# Patient Record
Sex: Female | Born: 1970 | Race: White | Hispanic: No | Marital: Married | Smoking: Never smoker
Health system: Southern US, Community
[De-identification: ages and names within clinical notes are randomized; demographics above are authoritative.]

## PROBLEM LIST (undated history)

## (undated) DIAGNOSIS — J302 Other seasonal allergic rhinitis: Secondary | ICD-10-CM

## (undated) DIAGNOSIS — R011 Cardiac murmur, unspecified: Secondary | ICD-10-CM

## (undated) DIAGNOSIS — J45909 Unspecified asthma, uncomplicated: Secondary | ICD-10-CM

## (undated) DIAGNOSIS — G40909 Epilepsy, unspecified, not intractable, without status epilepticus: Secondary | ICD-10-CM

## (undated) DIAGNOSIS — E039 Hypothyroidism, unspecified: Secondary | ICD-10-CM

## (undated) DIAGNOSIS — Z87440 Personal history of urinary (tract) infections: Secondary | ICD-10-CM

## (undated) DIAGNOSIS — I456 Pre-excitation syndrome: Secondary | ICD-10-CM

## (undated) DIAGNOSIS — R32 Unspecified urinary incontinence: Secondary | ICD-10-CM

## (undated) HISTORY — PX: NO PAST SURGERIES: SHX2092

## (undated) HISTORY — DX: Other seasonal allergic rhinitis: J30.2

## (undated) HISTORY — DX: Cardiac murmur, unspecified: R01.1

## (undated) HISTORY — DX: Hypothyroidism, unspecified: E03.9

## (undated) HISTORY — DX: Unspecified asthma, uncomplicated: J45.909

## (undated) HISTORY — DX: Pre-excitation syndrome: I45.6

## (undated) HISTORY — DX: Personal history of urinary (tract) infections: Z87.440

## (undated) HISTORY — DX: Unspecified urinary incontinence: R32

## (undated) HISTORY — DX: Epilepsy, unspecified, not intractable, without status epilepticus: G40.909

---

## 2001-10-26 ENCOUNTER — Encounter: Payer: Self-pay | Admitting: Obstetrics and Gynecology

## 2001-10-26 ENCOUNTER — Ambulatory Visit (HOSPITAL_COMMUNITY): Admission: RE | Admit: 2001-10-26 | Discharge: 2001-10-26 | Payer: Self-pay | Admitting: Obstetrics and Gynecology

## 2013-04-28 ENCOUNTER — Ambulatory Visit: Payer: Self-pay | Admitting: Endocrinology

## 2013-05-19 ENCOUNTER — Ambulatory Visit (INDEPENDENT_AMBULATORY_CARE_PROVIDER_SITE_OTHER): Payer: No Typology Code available for payment source | Admitting: Endocrinology

## 2013-05-19 ENCOUNTER — Encounter: Payer: Self-pay | Admitting: Endocrinology

## 2013-05-19 VITALS — BP 118/70 | HR 61 | Temp 98.3°F | Resp 10 | Ht 68.5 in | Wt 191.9 lb

## 2013-05-19 DIAGNOSIS — E039 Hypothyroidism, unspecified: Secondary | ICD-10-CM

## 2013-05-19 DIAGNOSIS — J45909 Unspecified asthma, uncomplicated: Secondary | ICD-10-CM

## 2013-05-19 LAB — TSH: TSH: 5.293 u[IU]/mL — ABNORMAL HIGH (ref 0.350–4.500)

## 2013-05-19 LAB — T4, FREE: Free T4: 1.36 ng/dL (ref 0.80–1.80)

## 2013-05-19 MED ORDER — LEVOTHYROXINE SODIUM 150 MCG PO TABS: 150.0000 ug | ORAL_TABLET | Freq: Every day | ORAL | Status: DC

## 2013-05-19 NOTE — Patient Instructions (Signed)
blood tests are being requested for you today.  We'll contact you with results.  

## 2013-05-19 NOTE — Progress Notes (Signed)
  Subjective:    Patient ID: Rebecca Silva, female    DOB: 1971/07/06, 42 y.o.   MRN: [id number]  HPI Pt says she was dx'ed with primary hypothyroidism in 2003.  She has been on thyroid hormone supplements since then.  She has been on the current dosage of 137 mcg/day, x approx 2 months.  This is the highest dosage she has ever taken.  She has slight hair loss on the head, and assoc fatigue.  Past Medical History  Diagnosis Date  . Thyroid disease     hypothyroidism  . Asthma   . Allergy     seasonal    History reviewed. No pertinent past surgical history.  History   Social History  . Marital Status: Married    Spouse Name: N/A    Number of Children: N/A  . Years of Education: N/A   Occupational History  . Not on file.   Social History Main Topics  . Smoking status: Never Smoker   . Smokeless tobacco: Never Used  . Alcohol Use: Yes  . Drug Use: No  . Sexual Activity: Yes    Partners: Female   Other Topics Concern  . 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   No current facility-administered medications on file prior to visit.    Not on File  Family History  Problem Relation Age of Onset  . Heart disease Mother 54    open heart surgery  . Heart disease Maternal Grandmother   no thyroid problems  BP 118/70  Pulse 61  Temp(Src) 98.3 F (36.8 C) (Oral)  Resp 10  Ht 5' 8.5" (1.74 m)  Wt 191 lb 14.4 oz (87.045 kg)  BMI 28.75 kg/m2  SpO2 95%  Review of Systems denies depression, memory loss, constipation, numbness, blurry vision, myalgias, and syncope.  She has dry skin, weight gain, rhinorrhea, easy bruising, and leg cramps.  She attributes intermittent sob to asthma.     Objective:   Physical Exam VS: see vs page GEN: no distress HEAD:  head: no deformity eyes: no periorbital swelling, no proptosis external nose and ears are normal mouth: no lesion seen NECK: supple, thyroid is not enlarged CHEST WALL: no deformity LUNGS:  Clear to auscultation CV: reg rate and rhythm, no murmur ABD: abdomen is soft, nontender.  no hepatosplenomegaly.  not distended.  no hernia MUSCULOSKELETAL: muscle bulk and strength are grossly normal.  no obvious joint swelling.  gait is normal and steady EXTEMITIES: no deformity.  no ulcer on the feet.  feet are of normal color and temp.  no edema PULSES: dorsalis pedis intact bilat.  no carotid bruit NEURO:  cn 2-12 grossly intact.   readily moves all 4's.  sensation is intact to touch on the feet.  No tremor SKIN:  Normal texture and temperature.  No rash or suspicious lesion is visible.  Not diaphoretic NODES:  None palpable at the neck PSYCH: alert, oriented x3.  Does not appear anxious nor depressed.   Lab Results  Component Value Date   TSH 5.293* 05/19/2013      Assessment & Plan:  Chronic hypothyroidism: she needs increased rx Sob, due to asthma Fatigue, not thyroid-related

## 2013-05-20 DIAGNOSIS — J4540 Moderate persistent asthma, uncomplicated: Secondary | ICD-10-CM | POA: Insufficient documentation

## 2013-05-22 ENCOUNTER — Telehealth: Payer: Self-pay | Admitting: *Deleted

## 2013-05-22 NOTE — Telephone Encounter (Signed)
Please increase thyroid pill. i have sent a prescription to your pharmacy. Please recheck the blood test in 1 month

## 2013-05-23 ENCOUNTER — Telehealth: Payer: Self-pay | Admitting: Endocrinology

## 2013-05-23 NOTE — Telephone Encounter (Signed)
CB# [phone] - please call w/ lab results-drawn at [facility]

## 2013-05-23 NOTE — Telephone Encounter (Signed)
Left message with lab results

## 2013-05-25 ENCOUNTER — Other Ambulatory Visit: Payer: Self-pay

## 2013-06-27 ENCOUNTER — Other Ambulatory Visit: Payer: Self-pay | Admitting: *Deleted

## 2013-06-27 ENCOUNTER — Other Ambulatory Visit: Payer: No Typology Code available for payment source

## 2013-06-27 DIAGNOSIS — E039 Hypothyroidism, unspecified: Secondary | ICD-10-CM

## 2013-06-30 ENCOUNTER — Other Ambulatory Visit (INDEPENDENT_AMBULATORY_CARE_PROVIDER_SITE_OTHER): Payer: No Typology Code available for payment source

## 2013-06-30 DIAGNOSIS — E039 Hypothyroidism, unspecified: Secondary | ICD-10-CM

## 2014-06-06 ENCOUNTER — Encounter (HOSPITAL_COMMUNITY): Payer: Self-pay

## 2014-06-06 ENCOUNTER — Emergency Department (HOSPITAL_COMMUNITY)
Admission: EM | Admit: 2014-06-06 | Discharge: 2014-06-06 | Disposition: A | Discharge status: Home / Self Care | Payer: 59 | Source: Home / Self Care | Attending: Emergency Medicine | Admitting: Emergency Medicine

## 2014-06-06 ENCOUNTER — Inpatient Hospital Stay (HOSPITAL_COMMUNITY)
Admission: EM | Admit: 2014-06-06 | Discharge: 2014-06-08 | DRG: 203 | Disposition: A | Discharge status: Home / Self Care | Payer: 59 | Attending: Internal Medicine | Admitting: Internal Medicine

## 2014-06-06 ENCOUNTER — Emergency Department (HOSPITAL_COMMUNITY): Payer: 59

## 2014-06-06 ENCOUNTER — Encounter (HOSPITAL_COMMUNITY): Payer: Self-pay | Admitting: *Deleted

## 2014-06-06 DIAGNOSIS — J069 Acute upper respiratory infection, unspecified: Secondary | ICD-10-CM | POA: Diagnosis present

## 2014-06-06 DIAGNOSIS — F439 Reaction to severe stress, unspecified: Secondary | ICD-10-CM | POA: Diagnosis present

## 2014-06-06 DIAGNOSIS — D72829 Elevated white blood cell count, unspecified: Secondary | ICD-10-CM | POA: Diagnosis present

## 2014-06-06 DIAGNOSIS — J45901 Unspecified asthma with (acute) exacerbation: Secondary | ICD-10-CM | POA: Insufficient documentation

## 2014-06-06 DIAGNOSIS — R000 Tachycardia, unspecified: Secondary | ICD-10-CM | POA: Diagnosis present

## 2014-06-06 DIAGNOSIS — R0602 Shortness of breath: Secondary | ICD-10-CM

## 2014-06-06 DIAGNOSIS — E039 Hypothyroidism, unspecified: Secondary | ICD-10-CM | POA: Insufficient documentation

## 2014-06-06 DIAGNOSIS — Z79899 Other long term (current) drug therapy: Secondary | ICD-10-CM

## 2014-06-06 DIAGNOSIS — B9789 Other viral agents as the cause of diseases classified elsewhere: Secondary | ICD-10-CM

## 2014-06-06 LAB — BASIC METABOLIC PANEL
Anion gap: 15 (ref 5–15)
BUN: 7 mg/dL (ref 6–23)
CALCIUM: 8.8 mg/dL (ref 8.4–10.5)
CO2: 22 mEq/L (ref 19–32)
Chloride: 101 mEq/L (ref 96–112)
Creatinine, Ser: 0.73 mg/dL (ref 0.50–1.10)
GFR calc Af Amer: >90 mL/min (ref >90)
GFR calc non Af Amer: >90 mL/min (ref >90)
GLUCOSE: 159 mg/dL — AB (ref 70–99)
Potassium: 4.6 mEq/L (ref 3.7–5.3)
Sodium: 138 mEq/L (ref 137–147)

## 2014-06-06 LAB — CBC WITH DIFFERENTIAL/PLATELET
Basophils Absolute: 0 10*3/uL (ref 0.0–0.1)
Basophils Relative: 0 % (ref 0–1)
EOS PCT: 0 % (ref 0–5)
Eosinophils Absolute: 0 10*3/uL (ref 0.0–0.7)
HCT: 35 % — ABNORMAL LOW (ref 36.0–46.0)
HEMOGLOBIN: 11.1 g/dL — AB (ref 12.0–15.0)
LYMPHS PCT: 3 % — AB (ref 12–46)
Lymphs Abs: 0.5 10*3/uL — ABNORMAL LOW (ref 0.7–4.0)
MCH: 24.9 pg — ABNORMAL LOW (ref 26.0–34.0)
MCHC: 31.7 g/dL (ref 30.0–36.0)
MCV: 78.7 fL (ref 78.0–100.0)
MONO ABS: 0.2 10*3/uL (ref 0.1–1.0)
Monocytes Relative: 1 % — ABNORMAL LOW (ref 3–12)
Neutro Abs: 18.8 10*3/uL — ABNORMAL HIGH (ref 1.7–7.7)
Neutrophils Relative %: 96 % — ABNORMAL HIGH (ref 43–77)
Platelets: 261 10*3/uL (ref 150–400)
RBC: 4.45 MIL/uL (ref 3.87–5.11)
RDW: 15.3 % (ref 11.5–15.5)
WBC: 19.6 10*3/uL — ABNORMAL HIGH (ref 4.0–10.5)

## 2014-06-06 MED ORDER — IPRATROPIUM BROMIDE 0.02 % IN SOLN
0.5000 mg | RESPIRATORY_TRACT | Status: AC
  Administered 2014-06-06: 0.5 mg via RESPIRATORY_TRACT
  Filled 2014-06-06: qty 2.5

## 2014-06-06 MED ORDER — MAGNESIUM SULFATE 2 GM/50ML IV SOLN
2.0000 g | Freq: Once | INTRAVENOUS | Status: AC
  Administered 2014-06-06: 2 g via INTRAVENOUS
  Filled 2014-06-06: qty 50

## 2014-06-06 MED ORDER — SODIUM CHLORIDE 0.45 % IV SOLN
INTRAVENOUS | Status: DC
  Administered 2014-06-06 – 2014-06-08 (×3): via INTRAVENOUS

## 2014-06-06 MED ORDER — ACETAMINOPHEN 325 MG PO TABS
650.0000 mg | ORAL_TABLET | Freq: Four times a day (QID) | ORAL | Status: DC | PRN
  Administered 2014-06-07 (×2): 650 mg via ORAL
  Filled 2014-06-06 (×2): qty 2

## 2014-06-06 MED ORDER — IPRATROPIUM BROMIDE 0.03 % NA SOLN
2.0000 | Freq: Three times a day (TID) | NASAL | Status: DC | PRN
  Filled 2014-06-06: qty 15

## 2014-06-06 MED ORDER — ONDANSETRON HCL 4 MG PO TABS: 4.0000 mg | ORAL_TABLET | Freq: Four times a day (QID) | ORAL | Status: DC | PRN

## 2014-06-06 MED ORDER — IBUPROFEN 600 MG PO TABS: 600.0000 mg | ORAL_TABLET | Freq: Four times a day (QID) | ORAL | Status: DC | PRN

## 2014-06-06 MED ORDER — PNEUMOCOCCAL VAC POLYVALENT 25 MCG/0.5ML IJ INJ
0.5000 mL | INJECTION | INTRAMUSCULAR | Status: AC
  Administered 2014-06-07: 0.5 mL via INTRAMUSCULAR
  Filled 2014-06-06: qty 0.5

## 2014-06-06 MED ORDER — LEVOTHYROXINE SODIUM 75 MCG PO TABS
150.0000 ug | ORAL_TABLET | Freq: Every day | ORAL | Status: DC
  Administered 2014-06-07 – 2014-06-08 (×2): 150 ug via ORAL
  Filled 2014-06-06 (×2): qty 2

## 2014-06-06 MED ORDER — HEPARIN SODIUM (PORCINE) 5000 UNIT/ML IJ SOLN
5000.0000 [IU] | Freq: Three times a day (TID) | INTRAMUSCULAR | Status: DC
  Administered 2014-06-06 – 2014-06-08 (×5): 5000 [IU] via SUBCUTANEOUS
  Filled 2014-06-06 (×5): qty 1

## 2014-06-06 MED ORDER — LEVALBUTEROL HCL 1.25 MG/0.5ML IN NEBU
1.2500 mg | INHALATION_SOLUTION | RESPIRATORY_TRACT | Status: DC
  Administered 2014-06-06 – 2014-06-07 (×4): 1.25 mg via RESPIRATORY_TRACT
  Filled 2014-06-06 (×4): qty 0.5

## 2014-06-06 MED ORDER — METHYLPREDNISOLONE SODIUM SUCC 125 MG IJ SOLR
125.0000 mg | Freq: Once | INTRAMUSCULAR | Status: AC
  Administered 2014-06-06: 125 mg via INTRAVENOUS
  Filled 2014-06-06: qty 2

## 2014-06-06 MED ORDER — SALINE SPRAY 0.65 % NA SOLN: 1.0000 | NASAL | Status: DC | PRN

## 2014-06-06 MED ORDER — PREDNISONE 20 MG PO TABS: 40.0000 mg | ORAL_TABLET | Freq: Every day | ORAL | Status: DC

## 2014-06-06 MED ORDER — DM-GUAIFENESIN ER 30-600 MG PO TB12
1.0000 | ORAL_TABLET | Freq: Two times a day (BID) | ORAL | Status: DC
  Administered 2014-06-06 – 2014-06-08 (×4): 1 via ORAL
  Filled 2014-06-06 (×4): qty 1

## 2014-06-06 MED ORDER — PREDNISONE 20 MG PO TABS
40.0000 mg | ORAL_TABLET | Freq: Once | ORAL | Status: AC
  Administered 2014-06-06: 40 mg via ORAL
  Filled 2014-06-06: qty 2

## 2014-06-06 MED ORDER — ONDANSETRON HCL 4 MG/2ML IJ SOLN: 4.0000 mg | Freq: Four times a day (QID) | INTRAMUSCULAR | Status: DC | PRN

## 2014-06-06 MED ORDER — ALBUTEROL (5 MG/ML) CONTINUOUS INHALATION SOLN
10.0000 mg/h | INHALATION_SOLUTION | RESPIRATORY_TRACT | Status: AC
  Administered 2014-06-06: 10 mg/h via RESPIRATORY_TRACT
  Filled 2014-06-06: qty 20

## 2014-06-06 MED ORDER — INFLUENZA VAC SPLIT QUAD 0.5 ML IM SUSY
0.5000 mL | PREFILLED_SYRINGE | INTRAMUSCULAR | Status: AC
  Administered 2014-06-07: 0.5 mL via INTRAMUSCULAR
  Filled 2014-06-06: qty 0.5

## 2014-06-06 MED ORDER — ALBUTEROL (5 MG/ML) CONTINUOUS INHALATION SOLN
10.0000 mg/h | INHALATION_SOLUTION | Freq: Once | RESPIRATORY_TRACT | Status: AC
  Administered 2014-06-06: 10 mg/h via RESPIRATORY_TRACT

## 2014-06-06 MED ORDER — ACETAMINOPHEN 650 MG RE SUPP: 650.0000 mg | Freq: Four times a day (QID) | RECTAL | Status: DC | PRN

## 2014-06-06 MED ORDER — METHYLPREDNISOLONE SODIUM SUCC 125 MG IJ SOLR
60.0000 mg | Freq: Four times a day (QID) | INTRAMUSCULAR | Status: DC
  Administered 2014-06-06 – 2014-06-08 (×6): 60 mg via INTRAVENOUS
  Filled 2014-06-06 (×6): qty 2

## 2014-06-06 MED ORDER — SODIUM CHLORIDE 0.9 % IN NEBU
INHALATION_SOLUTION | RESPIRATORY_TRACT | Status: AC
  Administered 2014-06-06: 3 mL
  Filled 2014-06-06: qty 3

## 2014-06-06 NOTE — ED Notes (Signed)
Pt states she feels a little it better.  Up ambulatory to bathroom.

## 2014-06-06 NOTE — ED Provider Notes (Signed)
CSN: [id number]     Arrival date & time 06/06/14  1049 History  This chart was scribed for Rebecca Acosta, MD by Rayfield Citizen, ED Scribe. This patient was seen in room APA06/APA06 and the patient's care was started at 11:29 AM.     Chief Complaint  Patient presents with  . Shortness of Breath   The history is provided by the patient. No language interpreter was used.     HPI Comments: Rebecca Silva is a 43 y.o. female with past medical history of asthma who presents to the Emergency Department 1 day of complaining of SOB and chest tightness. Patient does not have a daily treatment but utilizes an inhaler as needed; she has recently been using her inhaler 1-2x a day. She was given a sample of Advair and a zpack at the PCP this morning; she had an albuterol breathing treatment around 0930 and used her ventolin inhaler since leaving the PCP, but her symptoms have not improved. She denies fever.   Past Medical History  Diagnosis Date  . Thyroid disease     hypothyroidism  . Asthma   . Allergy     seasonal   No past surgical history on file. Family History  Problem Relation Age of Onset  . Heart disease Mother 2    open heart surgery  . Heart disease Maternal Grandmother    History  Substance Use Topics  . Smoking status: Never Smoker   . Smokeless tobacco: Never Used  . Alcohol Use: No   OB History    No data available     Review of Systems  Constitutional: Negative for fever.  Respiratory: Positive for chest tightness and shortness of breath.   All other systems reviewed and are negative.  Allergies  Review of patient's allergies indicates no known allergies.  Home Medications   Prior to Admission medications   Medication Sig Start Date End Date Taking? Authorizing Provider  albuterol (VENTOLIN HFA) 108 (90 BASE) MCG/ACT inhaler Inhale 2 puffs into the lungs every 6 (six) hours as needed for wheezing.   Yes Historical Provider, MD  levothyroxine (SYNTHROID,  LEVOTHROID) 150 MCG tablet Take 1 tablet (150 mcg total) by mouth daily before breakfast. 05/19/13  Yes Renato Shin, MD  Naproxen Sod-Diphenhydramine (ALEVE PM PO) Take 1 tablet by mouth at bedtime as needed and may repeat dose one time if needed (PAIN/SLEEP).   Yes Historical Provider, MD  predniSONE (DELTASONE) 20 MG tablet Take 2 tablets (40 mg total) by mouth daily. 06/06/14   Rebecca Acosta, MD   BP 132/68 mmHg  Pulse 118  Temp(Src) 98.4 F (36.9 C) (Oral)  Resp 22  Ht 5\' 8"  (1.727 m)  Wt 196 lb (88.905 kg)  BMI 29.81 kg/m2  SpO2 95%  LMP 06/04/2014 Physical Exam  Constitutional: She is oriented to person, place, and time. She appears well-developed and well-nourished.  HENT:  Head: Normocephalic and atraumatic.  Neck: No tracheal deviation present.  Cardiovascular: Normal rate.   Pulmonary/Chest: Effort normal. She has wheezes.  Mild diffuse expiratory wheezing  Neurological: She is alert and oriented to person, place, and time.  Skin: Skin is warm and dry.  Psychiatric: She has a normal mood and affect. Her behavior is normal.  Nursing note and vitals reviewed.   ED Course  Procedures   DIAGNOSTIC STUDIES: Oxygen Saturation is 95% on RA, normal by my interpretation.    COORDINATION OF CARE: 11:32 AM Discussed treatment plan with pt at bedside  and pt agreed to plan.   Labs Review Labs Reviewed - No data to display  Imaging Review Dg Chest 2 View  06/06/2014   CLINICAL DATA:  Shortness of breath, productive cough, wheezing  EXAM: CHEST  2 VIEW  COMPARISON:  None.  FINDINGS: Lungs are clear.  No pleural effusion or pneumothorax.  The heart is normal in size.  Visualized osseous structures are within normal limits.  IMPRESSION: No evidence of acute cardiopulmonary disease.   Electronically Signed   By: Julian Hy M.D.   On: 06/06/2014 12:56      MDM   Final diagnoses:  SOB (shortness of breath)    Continuous nebulizer therapy given, prednisone given,  the patient's vital signs have remained unchanged, she is oxygenating between 95 and 100%, she is not tachypneic or using accessory muscles, she is no longer wheezing after the treatment. She reports that she has improved, she already has medication at home including albuterol, Advair and prednisone which I have prescribed. She will take Zithromax as prescribed by her doctor and return for recheck. It is extremely unlikely that this is related to something else such as pneumonia or pneumothorax as her x-ray was normal, this does not sound consistent with cardiac disease. There is no peripheral edema, JVD or history of exertional symptoms. The patient is stable for discharge on the following medications.   Meds given in ED:  Medications  albuterol (PROVENTIL,VENTOLIN) solution continuous neb (10 mg/hr Nebulization New Bag/Given 06/06/14 1126)  ipratropium (ATROVENT) nebulizer solution 0.5 mg (0.5 mg Nebulization Given 06/06/14 1126)  predniSONE (DELTASONE) tablet 40 mg (40 mg Oral Given 06/06/14 1330)    New Prescriptions   PREDNISONE (DELTASONE) 20 MG TABLET    Take 2 tablets (40 mg total) by mouth daily.    I personally performed the services described in this documentation, which was scribed in my presence. The recorded information has been reviewed and is accurate.        Rebecca Acosta, MD 06/06/14 1340

## 2014-06-06 NOTE — ED Notes (Addendum)
Pt was seen earlier for same, co SOB, pt states when she got home from the ER the SOB returned just as before. Was treated in ER with breathing TX and prednisone. Pt's HR 120's, pt is diaphoretic at triage, EKG performed in triage.

## 2014-06-06 NOTE — H&P (Signed)
[facility] History and Physical  Rebecca Silva RSW:[id number] DOB: 03/22/1971 DOA: 06/06/2014  Referring physician: Dr Betsey Holiday - [facility] PCP: Renee Rival, NP   Chief Complaint: asthma  HPI: Rebecca Silva is a 43 y.o. female  Started getting SOb and wheezing last night. Associated w/ congestion and cough. Husband also w/ recent URI. Mucinex w/o relief. Using albuterol Q3hrs w/ some improvement. Getting worse.  Pt presenting w/ typical asthma flare. Came to ED earlier today and received nebs and prednisone. Initially felt better but then worsened. Came back to ED for further evaluation.  No feeling like her heart rate is very high because of the albuterol.    Review of Systems:  Constitutional:  No weight loss, night sweats, Fevers, chills, fatigue.  HEENT:  Runny nose, cough, upper airway congestion, post nasal drip,  Cardio-vascular:  No chest pain, Orthopnea, PND, swelling in lower extremities, anasarca, dizziness, palpitations  GI:  No heartburn, indigestion, abdominal pain, nausea, vomiting, diarrhea, change in bowel habits, loss of appetite  Resp:  Per HPI Skin:  no rash or lesions.  GU:  no dysuria, change in color of urine, no urgency or frequency. No flank pain.  Musculoskeletal:  No joint pain or swelling. No decreased range of motion. No back pain.  Psych:  No change in mood or affect. No depression or anxiety. No memory loss.   Past Medical History  Diagnosis Date  . Thyroid disease     hypothyroidism  . Asthma   . Allergy     seasonal   History reviewed. No pertinent past surgical history. Social History:  reports that she has never smoked. She has never used smokeless tobacco. She reports that she does not drink alcohol or use illicit drugs.  No Known Allergies  Family History  Problem Relation Age of Onset  . Heart disease Mother 66    open heart surgery  . Heart disease Maternal Grandmother      Prior to Admission medications     Medication Sig Start Date End Date Taking? Authorizing Provider  albuterol (VENTOLIN HFA) 108 (90 BASE) MCG/ACT inhaler Inhale 2 puffs into the lungs every 6 (six) hours as needed for wheezing.   Yes Historical Provider, MD  levothyroxine (SYNTHROID, LEVOTHROID) 150 MCG tablet Take 1 tablet (150 mcg total) by mouth daily before breakfast. 05/19/13  Yes Renato Shin, MD  Naproxen Sod-Diphenhydramine (ALEVE PM PO) Take 1 tablet by mouth at bedtime as needed and may repeat dose one time if needed (PAIN/SLEEP).   Yes Historical Provider, MD  predniSONE (DELTASONE) 20 MG tablet Take 2 tablets (40 mg total) by mouth daily. 06/06/14   Johnna Acosta, MD   Physical Exam: Filed Vitals:   06/06/14 1835 06/06/14 1840 06/06/14 1854 06/06/14 2032  BP: 126/49   131/53  Pulse: 128 124 125 112  Temp:    99 F (37.2 C)  TempSrc:    Oral  Resp: 26 27 22 22   Height:      Weight:      SpO2: 94% 94% 93% 94%    Wt Readings from Last 3 Encounters:  06/06/14 88.905 kg (196 lb)  06/06/14 88.905 kg (196 lb)  05/19/13 87.045 kg (191 lb 14.4 oz)    General:  Appears calm and comfortable Eyes:  PERRL, normal lids, irises & conjunctiva ENT: dry mucus membranes, grossly normal hearing,  Neck:  no LAD, masses or thyromegaly Cardiovascular: Tachy, no m/r/g. No LE edema. Respiratory: diminished breath sounds throughout, mild  increased WOB., diffuse wheezing throughout.  Abdomen:  soft, ntnd Skin:  no rash or induration seen on limited exam Musculoskeletal:  grossly normal tone BUE/BLE Psychiatric:  grossly normal mood and affect, speech fluent and appropriate Neurologic:  grossly non-focal.          Labs on Admission:  Basic Metabolic Panel:  Recent Labs Lab 06/06/14 1702  NA 138  K 4.6  CL 101  CO2 22  GLUCOSE 159*  BUN 7  CREATININE 0.73  CALCIUM 8.8   Liver Function Tests: No results for input(s): AST, ALT, ALKPHOS, BILITOT, PROT, ALBUMIN in the last 168 hours. No results for input(s):  LIPASE, AMYLASE in the last 168 hours. No results for input(s): AMMONIA in the last 168 hours. CBC:  Recent Labs Lab 06/06/14 1702  WBC 19.6*  NEUTROABS 18.8*  HGB 11.1*  HCT 35.0*  MCV 78.7  PLT 261   Cardiac Enzymes: No results for input(s): CKTOTAL, CKMB, CKMBINDEX, TROPONINI in the last 168 hours.  BNP (last 3 results) No results for input(s): PROBNP in the last 8760 hours. CBG: No results for input(s): GLUCAP in the last 168 hours.  Radiological Exams on Admission: Dg Chest 2 View  06/06/2014   CLINICAL DATA:  Shortness of breath, productive cough, wheezing  EXAM: CHEST  2 VIEW  COMPARISON:  None.  FINDINGS: Lungs are clear.  No pleural effusion or pneumothorax.  The heart is normal in size.  Visualized osseous structures are within normal limits.  IMPRESSION: No evidence of acute cardiopulmonary disease.   Electronically Signed   By: Julian Hy M.D.   On: 06/06/2014 12:56    EKG: Independently reviewed. Sinus tach. No sign of ischemia  Assessment/Plan Principal Problem:   Asthma exacerbation Active Problems:   Hypothyroidism   Leukocytosis   Viral URI with cough   Tachycardia  Asthma Exacerbation: likely started due to URI w/ PND and change in weather. CAT, Mag, and Solumedrol 125 in ED w/ improvement.  No O2 requirement but satting in the low to mid 90s. Tachy likely from the Albuterol - Admit - O2 PRN - Xopenex Q4 - Solumedrol 60mg  Q6 - IVF 1/2NS 164ml/hr overnight. Consider DC in am  URI: likely viral etiology.  - nasal saline - Mucinex - nasal atrovent - motrin  Leukocytosis: Stress reaction compounded by steroids vs infection. WBC 19.6 w/ L shift. CXR w/o sign of pulmonary process. AFVSS. Currently battling a viral URI.  - UA - CBC in am  Hypothyroidism:  - continue levothyroxine   Code Status: FULL DVT Prophylaxis: Hep [place] TID Family Communication: None Disposition Plan: pending improvement    MERRELL, [place] [facility] [email or web address] Password [email or web address]

## 2014-06-06 NOTE — ED Provider Notes (Signed)
CSN: [id number]     Arrival date & time 06/06/14  1628 History  This chart was scribe for Orpah Greek, * by Judithann Sauger, ED Scribe. The patient was seen in room APA09/APA09 and the patient's care was started at 4:45 PM.    Chief Complaint  Patient presents with  . Shortness of Breath   The history is provided by the patient. No language interpreter was used.   HPI Comments: Rebecca Silva is a 43 y.o. female with a hx of asthma who presents to the Emergency Department complaining of SOB. Pt was seen earlier in the day for similar symptoms. She states that her symptoms started yesterday so she came in to the ER this morning and received breathing treatments and steroids but her symptoms gradually worsened on her way home. Her oxygen saturation was 88% when she came in earlier.   Past Medical History  Diagnosis Date  . Thyroid disease     hypothyroidism  . Asthma   . Allergy     seasonal   History reviewed. No pertinent past surgical history. Family History  Problem Relation Age of Onset  . Heart disease Mother 69    open heart surgery  . Heart disease Maternal Grandmother    History  Substance Use Topics  . Smoking status: Never Smoker   . Smokeless tobacco: Never Used  . Alcohol Use: No   OB History    No data available     Review of Systems  Constitutional: Negative for fever and chills.  Respiratory: Positive for shortness of breath.   Cardiovascular: Negative for chest pain.  Gastrointestinal: Negative for nausea and vomiting.  Musculoskeletal: Negative for back pain.  All other systems reviewed and are negative.     Allergies  Review of patient's allergies indicates no known allergies.  Home Medications   Prior to Admission medications   Medication Sig Start Date End Date Taking? Authorizing Provider  albuterol (VENTOLIN HFA) 108 (90 BASE) MCG/ACT inhaler Inhale 2 puffs into the lungs every 6 (six) hours as needed for wheezing.   Yes  Historical Provider, MD  levothyroxine (SYNTHROID, LEVOTHROID) 150 MCG tablet Take 1 tablet (150 mcg total) by mouth daily before breakfast. 05/19/13  Yes Renato Shin, MD  Naproxen Sod-Diphenhydramine (ALEVE PM PO) Take 1 tablet by mouth at bedtime as needed and may repeat dose one time if needed (PAIN/SLEEP).   Yes Historical Provider, MD  predniSONE (DELTASONE) 20 MG tablet Take 2 tablets (40 mg total) by mouth daily. 06/06/14   Johnna Acosta, MD   BP 126/49 mmHg  Pulse 125  Temp(Src) 98.2 F (36.8 C) (Oral)  Resp 22  Ht 5\' 8"  (1.727 m)  Wt 196 lb (88.905 kg)  BMI 29.81 kg/m2  SpO2 93%  LMP 06/04/2014 Physical Exam  Constitutional: She is oriented to person, place, and time. She appears well-developed and well-nourished.  HENT:  Head: Normocephalic and atraumatic.  Cardiovascular: Normal rate.   Tachycardiac   Pulmonary/Chest: Effort normal. She has wheezes. She exhibits no tenderness.  Breath sounds reduced and bilateral wheezes  Tachypnea   Abdominal: She exhibits no distension. There is no tenderness.  Neurological: She is alert and oriented to person, place, and time.  Skin: Skin is warm and dry.  Psychiatric: She has a normal mood and affect.  Nursing note and vitals reviewed.   ED Course  Procedures (including critical care time) DIAGNOSTIC STUDIES: Oxygen Saturation is 94% on RA, adequate by my interpretation.  COORDINATION OF CARE: 4:49 PM- Pt advised of plan for treatment and pt agrees.   Labs Review Labs Reviewed  CBC WITH DIFFERENTIAL - Abnormal; Notable for the following:    WBC 19.6 (*)    Hemoglobin 11.1 (*)    HCT 35.0 (*)    MCH 24.9 (*)    Neutrophils Relative % 96 (*)    Neutro Abs 18.8 (*)    Lymphocytes Relative 3 (*)    Lymphs Abs 0.5 (*)    Monocytes Relative 1 (*)    All other components within normal limits  BASIC METABOLIC PANEL - Abnormal; Notable for the following:    Glucose, Bld 159 (*)    All other components within normal  limits    Imaging Review Dg Chest 2 View  06/06/2014   CLINICAL DATA:  Shortness of breath, productive cough, wheezing  EXAM: CHEST  2 VIEW  COMPARISON:  None.  FINDINGS: Lungs are clear.  No pleural effusion or pneumothorax.  The heart is normal in size.  Visualized osseous structures are within normal limits.  IMPRESSION: No evidence of acute cardiopulmonary disease.   Electronically Signed   By: Julian Hy M.D.   On: 06/06/2014 12:56     EKG Interpretation   Date/Time:  Wednesday June 06 2014 16:37:51 EST Ventricular Rate:  121 PR Interval:  132 QRS Duration: 114 QT Interval:  354 QTC Calculation: 502 R Axis:   21 Text Interpretation:  Sinus tachycardia Right atrial enlargement  Nonspecific ST and T wave abnormality Abnormal ECG No previous tracing  Confirmed by POLLINA  MD, CHRISTOPHER [phone]) on 06/06/2014 5:34:00 PM      MDM   Final diagnoses:  None   Patient returns to the ER for evaluation of wheezing. Patient does have a history of asthma. Patient reports that she had sudden onset of wheezing yesterday and worsening today. She was seen in the ER earlier today for similar symptoms. Patient received continuous nebulizer treatment and prednisone. After discharge, patient had acute worsening of her symptoms. Patient was exhibiting significant bronchospasm upon arrival to the ER. Oxygen saturation was 80% upon getting back to the room, but it did improve after she rested in the room. Patient continue to have significant wheezing, was given albuterol nebulizer treatment, 10 mg continuously. Patient initiated on IV Solu-Medrol and IV magnesium. She has improved. Patient will require hospitalization for further management.  I personally performed the services described in this documentation, which was scribed in my presence. The recorded information has been reviewed and is accurate.      Orpah Greek, MD 06/06/14 [phone]

## 2014-06-06 NOTE — Discharge Instructions (Signed)
Use your albuterol inhaler 2 puffs every 4 hours for the first 24 hours then 2 puffs every 4 hours as needed.  Prednisone once a day for the next 5 days  Zithromax as prescribed by her doctor.  Please call your doctor for a followup appointment within 24-48 hours. When you talk to your doctor please let them know that you were seen in the emergency department and have them acquire all of your records so that they can discuss the findings with you and formulate a treatment plan to fully care for your new and ongoing problems.

## 2014-06-06 NOTE — ED Notes (Signed)
Report given to [facility] on 300

## 2014-06-06 NOTE — ED Notes (Signed)
Pt c/o of SOB. States she feels better. Dr. Sabra Heck in to assess. Okay to d/c home.

## 2014-06-06 NOTE — ED Notes (Signed)
Pt also took 1 puff of advair that was given to her today.

## 2014-06-06 NOTE — ED Notes (Signed)
Pt reports history of asthma and started becoming SOB last night.  Reports went to pcp this morning and started a zpack.  Reports has taken an albuterol breathing treatment around 0930 and used her ventolin  inhaler but worse since leaving the doctor's office.  Reports was told to come to er for further eval.  Denies fever, c/o pain in middle of back.

## 2014-06-07 DIAGNOSIS — E038 Other specified hypothyroidism: Secondary | ICD-10-CM

## 2014-06-07 LAB — BASIC METABOLIC PANEL
ANION GAP: 11 (ref 5–15)
BUN: 8 mg/dL (ref 6–23)
CHLORIDE: 108 meq/L (ref 96–112)
CO2: 21 mEq/L (ref 19–32)
Calcium: 8.5 mg/dL (ref 8.4–10.5)
Creatinine, Ser: 0.66 mg/dL (ref 0.50–1.10)
Glucose, Bld: 154 mg/dL — ABNORMAL HIGH (ref 70–99)
Potassium: 4.4 mEq/L (ref 3.7–5.3)
SODIUM: 140 meq/L (ref 137–147)

## 2014-06-07 LAB — CBC
HCT: 31.2 % — ABNORMAL LOW (ref 36.0–46.0)
Hemoglobin: 10.1 g/dL — ABNORMAL LOW (ref 12.0–15.0)
MCH: 25.1 pg — AB (ref 26.0–34.0)
MCHC: 32.4 g/dL (ref 30.0–36.0)
MCV: 77.6 fL — ABNORMAL LOW (ref 78.0–100.0)
PLATELETS: 241 10*3/uL (ref 150–400)
RBC: 4.02 MIL/uL (ref 3.87–5.11)
RDW: 15.7 % — ABNORMAL HIGH (ref 11.5–15.5)
WBC: 21.7 10*3/uL — AB (ref 4.0–10.5)

## 2014-06-07 LAB — TSH: TSH: 4.3 u[IU]/mL (ref 0.350–4.500)

## 2014-06-07 MED ORDER — MOMETASONE FURO-FORMOTEROL FUM 100-5 MCG/ACT IN AERO
2.0000 | INHALATION_SPRAY | Freq: Two times a day (BID) | RESPIRATORY_TRACT | Status: DC
  Administered 2014-06-07 – 2014-06-08 (×2): 2 via RESPIRATORY_TRACT
  Filled 2014-06-07: qty 8.8

## 2014-06-07 MED ORDER — FLUTICASONE PROPIONATE HFA 110 MCG/ACT IN AERO
1.0000 | INHALATION_SPRAY | Freq: Two times a day (BID) | RESPIRATORY_TRACT | Status: DC
  Administered 2014-06-07 – 2014-06-08 (×2): 1 via RESPIRATORY_TRACT
  Filled 2014-06-07: qty 12

## 2014-06-07 MED ORDER — LEVALBUTEROL HCL 1.25 MG/0.5ML IN NEBU
1.2500 mg | INHALATION_SOLUTION | Freq: Four times a day (QID) | RESPIRATORY_TRACT | Status: DC
  Administered 2014-06-07 – 2014-06-08 (×4): 1.25 mg via RESPIRATORY_TRACT
  Filled 2014-06-07 (×4): qty 0.5

## 2014-06-07 NOTE — Discharge Planning (Addendum)
Pt SPO2 was 93% while resting on RA. Pt ambulated 572ft on RA and SPO2 fell to 87% While ambulating pt placed on 2L and remained at 90%  Pt may need O2 for ambulating at DC. Will need to re-eval if not DC today.

## 2014-06-07 NOTE — Plan of Care (Signed)
Problem: Phase I Progression Outcomes Goal: Dyspnea controlled at rest Outcome: Completed/Met Date Met:  06/07/14 Goal: Hemodynamically stable Outcome: Progressing Goal: Pain controlled Outcome: Completed/Met Date Met:  06/07/14

## 2014-06-07 NOTE — Care Management Note (Signed)
    Page 1 of 1   06/08/2014     12:07:58 PM CARE MANAGEMENT NOTE 06/08/2014  Patient:  Rebecca Silva, Rebecca Silva   Account Number:  [id number]  Date Initiated:  06/07/2014  Documentation initiated by:  Theophilus Kinds  Subjective/Objective Assessment:   Pt admitted from home with asthma. Pt lives with her husband and will return home at discharge. Pt is independent with ADL's. Pt has neb machine for home use.     Action/Plan:   Will continue to follow for discharge planning needs. ? need for home O2 at discharge.   Anticipated DC Date:  06/08/2014   Anticipated DC Plan:  [place]  CM consult      Choice offered to / List presented to:             Status of service:  Completed, signed off Medicare Important Message given?   (If response is "NO", the following Medicare IM given date fields will be blank) Date Medicare IM given:   Medicare IM given by:   Date Additional Medicare IM given:   Additional Medicare IM given by:    Discharge Disposition:  HOME/SELF CARE  Per UR Regulation:    If discussed at Long Length of Stay Meetings, dates discussed:    Comments:  06/08/14 [place], RN BSN CM Pt discharged home. Pt does not qualify for home O2. Pt given script for neb machine. Pt wants to pick it up at her pharmacy. Pt and pts nurse aware of discharge arrangements.  06/07/14 [place], RN BSN CM

## 2014-06-07 NOTE — Progress Notes (Signed)
UR chart review completed.  

## 2014-06-07 NOTE — Progress Notes (Signed)
[facility] PROGRESS NOTE  Rebecca Silva UVO:[id number] DOB: 1971/07/06 DOA: 06/06/2014 PCP: Renee Rival, NP  Assessment/Plan: Asthma Exacerbation: likely started due to URI in setting of possible worsening disease. she reports needing to take "rescue" inhaler twice daily for at least last 2 months. Has never had PFT. Reports yesterday PCP started on Advair but she has not had chance to start it. She remains somewhat sob with wheezes. Will continue  Solumedrol and nebs. Will add advair. Oxygen saturation level 92% n 2L. Will wean oxygen as able.   Leukocytosis: Stress reaction compounded by steroids vs infection. WBC tending up. She is afebrile and non-toxic appearing.  CXR w/o sign of pulmonary process.   Hypothyroidism: obtain TSH continue levothyroxine  Asthma: see #1. Concern for disease progression as increase in use of rescue inhaler. Will need PFT OP to assess stage of asthma   Code Status: full Family Communication: husband at bedside Disposition Plan: home hopefully in am   Consultants:  none  Procedures:  none  Antibiotics:  none  HPI/Subjective: Awake sitting up in bed. Reports only slight improvement in respiratory status. Some coughing  Objective: Filed Vitals:   06/07/14 0531  BP: 125/54  Pulse: 95  Temp: 98 F (36.7 C)  Resp: 16    Intake/Output Summary (Last 24 hours) at 06/07/14 1201 Last data filed at 06/07/14 0900  Gross per 24 hour  Intake    360 ml  Output      0 ml  Net    360 ml   Filed Weights   06/06/14 1631  Weight: 88.905 kg (196 lb)    Exam:   General:  Well nourished somewhat ill appearing  Cardiovascular: tachycardia but regular no LE edema No MGR  Respiratory: mild increased work of breathing with conversation but able to complete sentences. Diminished BS throughout with faint diffuse wheeze no crackles  Abdomen: soft non-distended +BS   Musculoskeletal: no clubbing or cyanosis   Data  Reviewed: Basic Metabolic Panel:  Recent Labs Lab 06/06/14 1702 06/07/14 0537  NA 138 140  K 4.6 4.4  CL 101 108  CO2 22 21  GLUCOSE 159* 154*  BUN 7 8  CREATININE 0.73 0.66  CALCIUM 8.8 8.5   Liver Function Tests: No results for input(s): AST, ALT, ALKPHOS, BILITOT, PROT, ALBUMIN in the last 168 hours. No results for input(s): LIPASE, AMYLASE in the last 168 hours. No results for input(s): AMMONIA in the last 168 hours. CBC:  Recent Labs Lab 06/06/14 1702 06/07/14 0537  WBC 19.6* 21.7*  NEUTROABS 18.8*  --   HGB 11.1* 10.1*  HCT 35.0* 31.2*  MCV 78.7 77.6*  PLT 261 241   Cardiac Enzymes: No results for input(s): CKTOTAL, CKMB, CKMBINDEX, TROPONINI in the last 168 hours. BNP (last 3 results) No results for input(s): PROBNP in the last 8760 hours. CBG: No results for input(s): GLUCAP in the last 168 hours.  No results found for this or any previous visit (from the past 240 hour(s)).   Studies: Dg Chest 2 View  06/06/2014   CLINICAL DATA:  Shortness of breath, productive cough, wheezing  EXAM: CHEST  2 VIEW  COMPARISON:  None.  FINDINGS: Lungs are clear.  No pleural effusion or pneumothorax.  The heart is normal in size.  Visualized osseous structures are within normal limits.  IMPRESSION: No evidence of acute cardiopulmonary disease.   Electronically Signed   By: Julian Hy M.D.   On: 06/06/2014 12:56    Scheduled  Meds: . dextromethorphan-guaiFENesin  1 tablet Oral BID  . heparin  5,000 Units Subcutaneous 3 times per day  . levalbuterol  1.25 mg Nebulization QID  . levothyroxine  150 mcg Oral QAC breakfast  . methylPREDNISolone (SOLU-MEDROL) injection  60 mg Intravenous Q6H   Continuous Infusions: . sodium chloride 50 mL/hr at 06/07/14 0931    Principal Problem:   Asthma exacerbation Active Problems:   Hypothyroidism   Leukocytosis   Viral URI with cough   Tachycardia    Time spent: 35 minutes    [facility] Pager  [phone]. If 7PM-7AM, please contact night-coverage at [email or web address], password [facility] 06/07/2014, 12:01 PM  LOS: 1 day

## 2014-06-08 LAB — CBC
HCT: 32.9 % — ABNORMAL LOW (ref 36.0–46.0)
HEMOGLOBIN: 10.6 g/dL — AB (ref 12.0–15.0)
MCH: 25.2 pg — ABNORMAL LOW (ref 26.0–34.0)
MCHC: 32.2 g/dL (ref 30.0–36.0)
MCV: 78.3 fL (ref 78.0–100.0)
Platelets: 294 10*3/uL (ref 150–400)
RBC: 4.2 MIL/uL (ref 3.87–5.11)
RDW: 16.2 % — ABNORMAL HIGH (ref 11.5–15.5)
WBC: 26.8 10*3/uL — AB (ref 4.0–10.5)

## 2014-06-08 MED ORDER — LEVALBUTEROL HCL 1.25 MG/0.5ML IN NEBU: 1.2500 mg | INHALATION_SOLUTION | Freq: Four times a day (QID) | RESPIRATORY_TRACT | Status: DC

## 2014-06-08 MED ORDER — DM-GUAIFENESIN ER 30-600 MG PO TB12: 1.0000 | ORAL_TABLET | Freq: Two times a day (BID) | ORAL | Status: DC

## 2014-06-08 MED ORDER — IPRATROPIUM BROMIDE 0.03 % NA SOLN: 2.0000 | Freq: Three times a day (TID) | NASAL | Status: DC | PRN

## 2014-06-08 MED ORDER — FLUTICASONE PROPIONATE HFA 110 MCG/ACT IN AERO: 1.0000 | INHALATION_SPRAY | Freq: Two times a day (BID) | RESPIRATORY_TRACT | Status: DC

## 2014-06-08 MED ORDER — PREDNISONE 10 MG PO TABS: ORAL_TABLET | ORAL | Status: DC

## 2014-06-08 NOTE — Discharge Instructions (Signed)
Asthma, Acute Bronchospasm °Acute bronchospasm caused by asthma is also referred to as an asthma attack. Bronchospasm means your air passages become narrowed. The narrowing is caused by inflammation and tightening of the muscles in the air tubes (bronchi) in your lungs. This can make it hard to breathe or cause you to wheeze and cough. °CAUSES °Possible triggers are: °· Animal dander from the skin, hair, or feathers of animals. °· Dust mites contained in house dust. °· Cockroaches. °· Pollen from trees or grass. °· Mold. °· Cigarette or tobacco smoke. °· Air pollutants such as dust, household cleaners, hair sprays, aerosol sprays, paint fumes, strong chemicals, or strong odors. °· Cold air or weather changes. Cold air may trigger inflammation. Winds increase molds and pollens in the air. °· Strong emotions such as crying or laughing hard. °· Stress. °· Certain medicines such as aspirin or beta-blockers. °· Sulfites in foods and drinks, such as dried fruits and wine. °· Infections or inflammatory conditions, such as a flu, cold, or inflammation of the nasal membranes (rhinitis). °· Gastroesophageal reflux disease (GERD). GERD is a condition where stomach acid backs up into your esophagus. °· Exercise or strenuous activity. °SIGNS AND SYMPTOMS  °· Wheezing. °· Excessive coughing, particularly at night. °· Chest tightness. °· Shortness of breath. °DIAGNOSIS  °Your health care provider will ask you about your medical history and perform a physical exam. A chest X-ray or blood testing may be performed to look for other causes of your symptoms or other conditions that may have triggered your asthma attack.  °TREATMENT  °Treatment is aimed at reducing inflammation and opening up the airways in your lungs.  Most asthma attacks are treated with inhaled medicines. These include quick relief or rescue medicines (such as bronchodilators) and controller medicines (such as inhaled corticosteroids). These medicines are sometimes  given through an inhaler or a nebulizer. Systemic steroid medicine taken by mouth or given through an IV tube also can be used to reduce the inflammation when an attack is moderate or severe. Antibiotic medicines are only used if a bacterial infection is present.  °HOME CARE INSTRUCTIONS  °· Rest. °· Drink plenty of liquids. This helps the mucus to remain thin and be easily coughed up. Only use caffeine in moderation and do not use alcohol until you have recovered from your illness. °· Do not smoke. Avoid being exposed to secondhand smoke. °· You play a critical role in keeping yourself in good health. Avoid exposure to things that cause you to wheeze or to have breathing problems. °· Keep your medicines up-to-date and available. Carefully follow your health care provider's treatment plan. °· Take your medicine exactly as prescribed. °· When pollen or pollution is bad, keep windows closed and use an air conditioner or go to places with air conditioning. °· Asthma requires careful medical care. See your health care provider for a follow-up as advised. If you are more than [redacted] weeks pregnant and you were prescribed any new medicines, let your obstetrician know about the visit and how you are doing. Follow up with your health care provider as directed. °· After you have recovered from your asthma attack, make an appointment with your outpatient doctor to talk about ways to reduce the likelihood of future attacks. If you do not have a doctor who manages your asthma, make an appointment with a primary care doctor to discuss your asthma. °SEEK IMMEDIATE MEDICAL CARE IF:  °· You are getting worse. °· You have trouble breathing. If severe, call your local   emergency services (911 in the [place] °· You develop chest pain or discomfort. °· You are vomiting. °· You are not able to keep fluids down. °· You are coughing up yellow, green, brown, or bloody sputum. °· You have a fever and your symptoms suddenly get worse. °· You have  trouble swallowing. °MAKE SURE YOU:  °· Understand these instructions. °· Will watch your condition. °· Will get help right away if you are not doing well or get worse. °Document Released: 10/21/2006 Document Revised: 07/11/2013 Document Reviewed: 01/11/2013 °Exit[employer]® Patient Information ©2015 [employer]. This information is not intended to replace advice given to you by your health care provider. Make sure you discuss any questions you have with your health care provider. ° °Asthma Attack Prevention °Although there is no way to prevent asthma from starting, you can take steps to control the disease and reduce its symptoms. Learn about your asthma and how to control it. Take an active role to control your asthma by working with your health care provider to create and follow an asthma action plan. An asthma action plan guides you in: °· Taking your medicines properly. °· Avoiding things that set off your asthma or make your asthma worse (asthma triggers). °· Tracking your level of asthma control. °· Responding to worsening asthma. °· Seeking emergency care when needed. °To track your asthma, keep records of your symptoms, check your peak flow number using a handheld device that shows how well air moves out of your lungs (peak flow meter), and get regular asthma checkups.  °WHAT ARE SOME WAYS TO PREVENT AN ASTHMA ATTACK? °· Take medicines as directed by your health care provider. °· Keep track of your asthma symptoms and level of control. °· With your health care provider, write a detailed plan for taking medicines and managing an asthma attack. Then be sure to follow your action plan. Asthma is an ongoing condition that needs regular monitoring and treatment. °· Identify and avoid asthma triggers. Many outdoor allergens and irritants (such as pollen, mold, cold air, and air pollution) can trigger asthma attacks. Find out what your asthma triggers are and take steps to avoid them. °· Monitor your breathing. Learn  to recognize warning signs of an attack, such as coughing, wheezing, or shortness of breath. Your lung function may decrease before you notice any signs or symptoms, so regularly measure and record your peak airflow with a home peak flow meter. °· Identify and treat attacks early. If you act quickly, you are less likely to have a severe attack. You will also need less medicine to control your symptoms. When your peak flow measurements decrease and alert you to an upcoming attack, take your medicine as instructed and immediately stop any activity that may have triggered the attack. If your symptoms do not improve, get medical help. °· Pay attention to increasing quick-relief inhaler use. If you find yourself relying on your quick-relief inhaler, your asthma is not under control. See your health care provider about adjusting your treatment. °WHAT CAN MAKE MY SYMPTOMS WORSE? °A number of common things can set off or make your asthma symptoms worse and cause temporary increased inflammation of your airways. Keep track of your asthma symptoms for several weeks, detailing all the environmental and emotional factors that are linked with your asthma. When you have an asthma attack, go back to your asthma diary to see which factor, or combination of factors, might have contributed to it. Once you know what these factors are, you can   take steps to control many of them. If you have allergies and asthma, it is important to take asthma prevention steps at home. Minimizing contact with the substance to which you are allergic will help prevent an asthma attack. Some triggers and ways to avoid these triggers are: °Animal Dander:  °Some people are allergic to the flakes of skin or dried saliva from animals with fur or feathers.  °· There is no such thing as a hypoallergenic dog or cat breed. All dogs or cats can cause allergies, even if they don't shed. °· Keep these pets out of your home. °· If you are not able to keep a pet  outdoors, keep the pet out of your bedroom and other sleeping areas at all times, and keep the door closed. °· Remove carpets and furniture covered with cloth from your home. If that is not possible, keep the pet away from fabric-covered furniture and carpets. °Dust Mites: °Many people with asthma are allergic to dust mites. Dust mites are tiny bugs that are found in every home in mattresses, pillows, carpets, fabric-covered furniture, bedcovers, clothes, stuffed toys, and other fabric-covered items.  °· Cover your mattress in a special dust-proof cover. °· Cover your pillow in a special dust-proof cover, or wash the pillow each week in hot water. Water must be hotter than 130° F (54.4° C) to kill dust mites. Cold or warm water used with detergent and bleach can also be effective. °· Wash the sheets and blankets on your bed each week in hot water. °· Try not to sleep or lie on cloth-covered cushions. °· Call ahead when traveling and ask for a smoke-free hotel room. Bring your own bedding and pillows in case the hotel only supplies feather pillows and down comforters, which may contain dust mites and cause asthma symptoms. °· Remove carpets from your bedroom and those laid on concrete, if you can. °· Keep stuffed toys out of the bed, or wash the toys weekly in hot water or cooler water with detergent and bleach. °Cockroaches: °Many people with asthma are allergic to the droppings and remains of cockroaches.  °· Keep food and garbage in closed containers. Never leave food out. °· Use poison baits, traps, powders, gels, or paste (for example, boric acid). °· If a spray is used to kill cockroaches, stay out of the room until the odor goes away. °Indoor Mold: °· Fix leaky faucets, pipes, or other sources of water that have mold around them. °· Clean floors and moldy surfaces with a fungicide or diluted bleach. °· Avoid using humidifiers, vaporizers, or swamp coolers. These can spread molds through the air. °Pollen and  Outdoor Mold: °· When pollen or mold spore counts are high, try to keep your windows closed. °· Stay indoors with windows closed from late morning to afternoon. Pollen and some mold spore counts are highest at that time. °· Ask your health care provider whether you need to take anti-inflammatory medicine or increase your dose of the medicine before your allergy season starts. °Other Irritants to Avoid: °· Tobacco smoke is an irritant. If you smoke, ask your health care provider how you can quit. Ask family members to quit smoking, too. Do not allow smoking in your home or car. °· If possible, do not use a wood-burning stove, kerosene heater, or fireplace. Minimize exposure to all sources of smoke, including incense, candles, fires, and fireworks. °· Try to stay away from strong odors and sprays, such as perfume, talcum powder, hair spray, and   paints. °· Decrease humidity in your home and use an indoor air cleaning device. Reduce indoor humidity to below 60%. Dehumidifiers or central air conditioners can do this. °· Decrease house dust exposure by changing furnace and air cooler filters frequently. °· Try to have someone else vacuum for you once or twice a week. Stay out of rooms while they are being vacuumed and for a short while afterward. °· If you vacuum, use a dust mask from a hardware store, a double-layered or microfilter vacuum cleaner bag, or a vacuum cleaner with a HEPA filter. °· Sulfites in foods and beverages can be irritants. Do not drink beer or wine or eat dried fruit, processed potatoes, or shrimp if they cause asthma symptoms. °· Cold air can trigger an asthma attack. Cover your nose and mouth with a scarf on cold or windy days. °· Several health conditions can make asthma more difficult to manage, including a runny nose, sinus infections, reflux disease, psychological stress, and sleep apnea. Work with your health care provider to manage these conditions. °· Avoid close contact with people who have  a respiratory infection such as a cold or the flu, since your asthma symptoms may get worse if you catch the infection. Wash your hands thoroughly after touching items that may have been handled by people with a respiratory infection. °· Get a flu shot every year to protect against the flu virus, which often makes asthma worse for days or weeks. Also get a pneumonia shot if you have not previously had one. Unlike the flu shot, the pneumonia shot does not need to be given yearly. °Medicines: °· Talk to your health care provider about whether it is safe for you to take aspirin or non-steroidal anti-inflammatory medicines (NSAIDs). In a small number of people with asthma, aspirin and NSAIDs can cause asthma attacks. These medicines must be avoided by people who have known aspirin-sensitive asthma. It is important that people with aspirin-sensitive asthma read labels of all over-the-counter medicines used to treat pain, colds, coughs, and fever. °· Beta-blockers and ACE inhibitors are other medicines you should discuss with your health care provider. °HOW CAN I FIND OUT WHAT I AM ALLERGIC TO? °Ask your asthma health care provider about allergy skin testing or blood testing (the RAST test) to identify the allergens to which you are sensitive. If you are found to have allergies, the most important thing to do is to try to avoid exposure to any allergens that you are sensitive to as much as possible. Other treatments for allergies, such as medicines and allergy shots (immunotherapy) are available.  °CAN I EXERCISE? °Follow your health care provider's advice regarding asthma treatment before exercising. It is important to maintain a regular exercise program, but vigorous exercise or exercise in cold, humid, or dry environments can cause asthma attacks, especially for those people who have exercise-induced asthma. °Document Released: 06/24/2009 Document Revised: 07/11/2013 Document Reviewed: 01/11/2013 °ExitCare® Patient  Information ©2015 [employer]. This information is not intended to replace advice given to you by your health care provider. Make sure you discuss any questions you have with your health care provider. ° °

## 2014-06-08 NOTE — Progress Notes (Addendum)
Patient was ambulated greater than or equal to 250 feet.  Her sats remained 94% or greater.  The patient stated that she was not SOB or c/o any other symptoms.  Dyanne Carrel, NP was notified.  Late entry from this am.

## 2014-06-08 NOTE — Progress Notes (Signed)
Patient discharged with instructions, prescription, and care notes.  Verbalized understanding via teach back.  IV was removed and the site was WNL. Patient voiced no further complaints or concerns at the time of discharge.  Appointments scheduled per instructions.  Patient left the floor via w/c with staff and family in stable condition. 

## 2014-06-08 NOTE — Discharge Summary (Signed)
Physician Discharge Summary  Rebecca Silva VPX:[id number] DOB: [birth year] DOA: 06/06/2014  PCP: Renee Rival, NP  Admit date: 06/06/2014 Discharge date: 06/08/2014  Time spent: 40 minutes  Recommendations for Outpatient Follow-up:  1. Follow up with PCP 1 week for evaluation of respiratory status. Consider pulmonary function tests when appropriate and possible referral to pulmonology. Follow TSH current level 4.3   Discharge Diagnoses:  Principal Problem:   Asthma exacerbation Active Problems:   Hypothyroidism   Leukocytosis   Viral URI with cough   Tachycardia   Discharge Condition: stable  Diet recommendation: regular  Filed Weights   06/06/14 1631  Weight: 88.905 kg (196 lb)    History of present illness:  Rebecca Silva is a 43 y.o. female started getting SOb and wheezing 06/06/14. Associated w/ congestion and cough. Husband also w/ recent URI. Mucinex w/o relief. Using albuterol Q3hrs w/ some improvement. Failed to improve. Presented 06/06/14 w/ typical asthma flare. Had come to  ED earlier  and received nebs and prednisone. Initially felt better but then worsened. Came back to ED for further evaluation. Complained of  feeling like her heart rate was very high because of the albuterol  Hospital Course:  Asthma Exacerbation: likely started due to URI in setting of possible worsening disease. she reported needing to take "rescue" inhaler twice daily for at least last 2 months. Never had PFT. Provided with Solumedrol and nebs as well as steroid inhaler. Improved quickly and at discharge oxygen saturation level 94% on room air with ambulation. Will discharge with prednisone taper, nebulizer and steroid inhaler. Recommend follow up with PCP 1 week   Leukocytosis: Stress reaction compounded by steroids. She remained febrile and non-toxic appearing. CXR w/o sign of pulmonary process.   Hypothyroidism: TSH 4.3.  Asthma: see #1. Concern for disease progression as  increase in use of rescue inhaler. Will need PFT OP to assess stage of asthma. Have added steroid inhaler. Follow up with PCP    Procedures:  none  Consultations:  none  Discharge Exam: Filed Vitals:   06/08/14 0500  BP: 128/67  Pulse: 100  Temp: 97.9 F (36.6 C)  Resp: 21    General: well nourished appears well Cardiovascular: mildly tachycardic but regular no MGR No LE edema Respiratory: normal effort with conversation improved air flow. Decreased wheezing.   Discharge Instructions You were cared for by a hospitalist during your hospital stay. If you have any questions about your discharge medications or the care you received while you were in the hospital after you are discharged, you can call the unit and asked to speak with the hospitalist on call if the hospitalist that took care of you is not available. Once you are discharged, your primary care physician will handle any further medical issues. Please note that NO REFILLS for any discharge medications will be authorized once you are discharged, as it is imperative that you return to your primary care physician (or establish a relationship with a primary care physician if you do not have one) for your aftercare needs so that they can reassess your need for medications and monitor your lab values.  Discharge Instructions    Diet - low sodium heart healthy    Complete by:  As directed      Discharge instructions    Complete by:  As directed   Take medications as directed Follow up with PCP 1 week for evaluation of respiratory status. Recommend pulmonary function tests and consider referral to pulmonologist  Increase activity slowly    Complete by:  As directed           Current Discharge Medication List    START taking these medications   Details  dextromethorphan-guaiFENesin (MUCINEX DM) 30-600 MG per 12 hr tablet Take 1 tablet by mouth 2 (two) times daily.    fluticasone (FLOVENT HFA) 110 MCG/ACT inhaler Inhale  1 puff into the lungs 2 (two) times daily. Qty: 1 Inhaler, Refills: 12    ipratropium (ATROVENT) 0.03 % nasal spray Place 2 sprays into both nostrils 3 (three) times daily as needed (nasal congestion). Qty: 30 mL, Refills: 12    levalbuterol (XOPENEX) 1.25 MG/0.5ML nebulizer solution Take 1.25 mg by nebulization 4 (four) times daily. Qty: 1 each, Refills: 12      CONTINUE these medications which have CHANGED   Details  predniSONE (DELTASONE) 10 MG tablet Take 4 tabs for 3 days starting 06/08/14 then take 3 tabs for 3 days then take 2 tabs for 3 days then take 1 tab for 3 days then stop. Qty: 30 tablet, Refills: 0      CONTINUE these medications which have NOT CHANGED   Details  albuterol (VENTOLIN HFA) 108 (90 BASE) MCG/ACT inhaler Inhale 2 puffs into the lungs every 6 (six) hours as needed for wheezing.    levothyroxine (SYNTHROID, LEVOTHROID) 150 MCG tablet Take 1 tablet (150 mcg total) by mouth daily before breakfast. Qty: 30 tablet, Refills: 11    Naproxen Sod-Diphenhydramine (ALEVE PM PO) Take 1 tablet by mouth at bedtime as needed and may repeat dose one time if needed (PAIN/SLEEP).       No Known Allergies    The results of significant diagnostics from this hospitalization (including imaging, microbiology, ancillary and laboratory) are listed below for reference.    Significant Diagnostic Studies: Dg Chest 2 View  06/06/2014   CLINICAL DATA:  Shortness of breath, productive cough, wheezing  EXAM: CHEST  2 VIEW  COMPARISON:  None.  FINDINGS: Lungs are clear.  No pleural effusion or pneumothorax.  The heart is normal in size.  Visualized osseous structures are within normal limits.  IMPRESSION: No evidence of acute cardiopulmonary disease.   Electronically Signed   By: Julian Hy M.D.   On: 06/06/2014 12:56    Microbiology: No results found for this or any previous visit (from the past 240 hour(s)).   Labs: Basic Metabolic Panel:  Recent Labs Lab  06/06/14 1702 06/07/14 0537  NA 138 140  K 4.6 4.4  CL 101 108  CO2 22 21  GLUCOSE 159* 154*  BUN 7 8  CREATININE 0.73 0.66  CALCIUM 8.8 8.5   Liver Function Tests: No results for input(s): AST, ALT, ALKPHOS, BILITOT, PROT, ALBUMIN in the last 168 hours. No results for input(s): LIPASE, AMYLASE in the last 168 hours. No results for input(s): AMMONIA in the last 168 hours. CBC:  Recent Labs Lab 06/06/14 1702 06/07/14 0537 06/08/14 0621  WBC 19.6* 21.7* 26.8*  NEUTROABS 18.8*  --   --   HGB 11.1* 10.1* 10.6*  HCT 35.0* 31.2* 32.9*  MCV 78.7 77.6* 78.3  PLT 261 241 294   Cardiac Enzymes: No results for input(s): CKTOTAL, CKMB, CKMBINDEX, TROPONINI in the last 168 hours. BNP: BNP (last 3 results) No results for input(s): PROBNP in the last 8760 hours. CBG: No results for input(s): GLUCAP in the last 168 hours.     SignedRadene Gunning  [facility] 06/08/2014, 10:59 AM

## 2014-06-20 ENCOUNTER — Ambulatory Visit (INDEPENDENT_AMBULATORY_CARE_PROVIDER_SITE_OTHER): Payer: 59 | Admitting: Internal Medicine

## 2014-06-20 ENCOUNTER — Encounter: Payer: Self-pay | Admitting: Internal Medicine

## 2014-06-20 VITALS — BP 126/78 | HR 88 | Ht 68.5 in | Wt 199.0 lb

## 2014-06-20 DIAGNOSIS — J4540 Moderate persistent asthma, uncomplicated: Secondary | ICD-10-CM

## 2014-06-20 MED ORDER — BECLOMETHASONE DIPROPIONATE 80 MCG/ACT IN AERS: INHALATION_SPRAY | RESPIRATORY_TRACT | Status: DC

## 2014-06-20 NOTE — Patient Instructions (Signed)
Plan A = automatic = qvar 80 Take 2 puffs first thing in am and then another 2 puffs about 12 hours later (other option is dulera but probably don't need it)   Plan B = backup  Only use your albuterol as a rescue medication to be used if you can't catch your breath by resting or doing a relaxed purse lip breathing pattern.  - The less you use it, the better it will work when you need it. - Ok to use up to 2 puffs  every 4 hours if you must but call for immediate appointment if use goes up over your usual need - Don't leave home without it !!  (think of it like the spare tire for your car)   Plan C= Crisis = nebulizer if plan A and B don't work   Plan D = Doctor, call me if not satisfied with the above  Please schedule a follow up office visit in 4 weeks, sooner if needed

## 2014-06-20 NOTE — Progress Notes (Signed)
Subjective:    Patient ID: Rebecca Silva, female    DOB: 1970/11/03   MRN: [id number]  HPI  70 yowf never smoker with tendency to bronchitis all her life sev times a year never required prednisone or breathing treatments until mid 75s but started then on prn saba  But never needed maint inhaler or more than twice weekly saba but since Dec 2014 needed it more often as much as twice daily then admitted:  Admit date: 06/06/2014 Discharge date: 06/08/2014     Recommendations for Outpatient Follow-up:  1. Follow up with PCP 1 week for evaluation of respiratory status. Consider pulmonary function tests when appropriate and possible referral to pulmonology. Follow TSH current level 4.3   Discharge Diagnoses:  Principal Problem:  Asthma exacerbation Active Problems:  Hypothyroidism  Leukocytosis  Viral URI with cough  Tachycardia   Discharge Condition: stable  Diet recommendation: regular  Filed Weights   06/06/14 1631  Weight: 88.905 kg (196 lb)    History of present illness:  Rebecca Silva is a 43 y.o. female started getting SOb and wheezing 06/06/14. Associated w/ congestion and cough. Husband also w/ recent URI. Mucinex w/o relief. Using albuterol Q3hrs w/ some improvement. Failed to improve. Presented 06/06/14 w/ typical asthma flare. Had come to ED earlier and received nebs and prednisone. Initially felt better but then worsened. Came back to ED for further evaluation. Complained of feeling like her heart rate was very high because of the albuterol  Hospital Course:  Asthma Exacerbation: likely started due to URI in setting of possible worsening disease. she reported needing to take "rescue" inhaler twice daily for at least last 2 months. Never had PFT. Provided with Solumedrol and nebs as well as steroid inhaler. Improved quickly and at discharge oxygen saturation level 94% on room air with ambulation. Will discharge with prednisone taper, nebulizer and  steroid inhaler. Recommend follow up with PCP 1 week   Leukocytosis: Stress reaction compounded by steroids. She remained febrile and non-toxic appearing. CXR w/o sign of pulmonary process.   Hypothyroidism: TSH 4.3.  Asthma: see #1. Concern for disease progression as increase in use of rescue inhaler. Will need PFT OP to assess stage of asthma. Have added steroid inhaler. Follow up with PCP      06/20/2014 1st [facility] office visit/ [name]  / referred by [facility] after above admit  Chief Complaint  Patient presents with  . Pulmonary Consult    Self referral. Pt states dxed with Asthma approx 10 yrs ago. She c/o SOB and cough since mid Nov 2015.  Her cough is prod with minimal yellow sputum.  She is using rescue inhaler on average 2 x per day.     much better since admit but note over use of saba at baseline and still doe x exertion but not at rest/ sleeping or adls  No obvious  patterns in day to day or daytime variabilty or assoc   cp or chest tightness, subjective wheeze overt sinus or hb symptoms. No unusual exp hx or h/o childhood pna/ asthma or knowledge of premature birth.  Sleeping ok without nocturnal  or early am exacerbation  of respiratory  c/o's or need for noct saba. Also denies any obvious fluctuation of symptoms with weather or environmental changes or other aggravating or alleviating factors except as outlined above   Current Medications, Allergies, Complete Past Medical History, Past Surgical History, Family History, and Social History were reviewed in [employer] record.  Review of Systems  Constitutional: Negative for fever, chills and unexpected weight change.  HENT: Positive for congestion. Negative for dental problem, ear pain, nosebleeds, postnasal drip, rhinorrhea, sinus pressure, sneezing, sore throat, trouble swallowing and voice change.   Eyes: Negative for visual disturbance.  Respiratory: Positive for cough and shortness  of breath. Negative for choking.   Cardiovascular: Negative for chest pain and leg swelling.  Gastrointestinal: Negative for vomiting, abdominal pain and diarrhea.  Genitourinary: Negative for difficulty urinating.  Musculoskeletal: Negative for arthralgias.  Skin: Negative for rash.  Neurological: Negative for tremors, syncope and headaches.  Hematological: Does not bruise/bleed easily.       Objective:   Physical Exam  amb wf nad  Wt Readings from Last 3 Encounters:  06/20/14 199 lb (90.266 kg)  06/06/14 196 lb (88.905 kg)  06/06/14 196 lb (88.905 kg)    Vital signs reviewed  HEENT: nl dentition, turbinates, and orophanx. Nl external ear canals without cough reflex   NECK :  without JVD/Nodes/TM/ nl carotid upstrokes bilaterally   LUNGS: no acc muscle use, clear to A and P bilaterally without cough on insp or exp maneuvers   CV:  RRR  no s3 or murmur or increase in P2, no edema   ABD:  soft and nontender with nl excursion in the supine position. No bruits or organomegaly, bowel sounds nl  MS:  warm without deformities, calf tenderness, cyanosis or clubbing  SKIN: warm and dry without lesions    NEURO:  alert, approp, no deficits   cxr 06/06/14 No evidence of acute cardiopulmonary disease           Assessment & Plan:

## 2014-06-23 NOTE — Assessment & Plan Note (Signed)
DDX of  difficult airways management all start with A and  include Adherence, Ace Inhibitors, Acid Reflux, Active Sinus Disease, Alpha 1 Antitripsin deficiency, Anxiety masquerading as Airways dz,  ABPA,  allergy(esp in young), Aspiration (esp in elderly), Adverse effects of DPI,  Active smokers, plus two Bs  = Bronchiectasis and Beta blocker use..and one C= CHF  In this case Adherence is the biggest issue and starts with  inability to use HFA effectively and also  understand that SABA treats the symptoms but doesn't get to the underlying problem (inflammation).  I used  the analogy of putting steroid cream on a rash to help explain the meaning of topical therapy and the need to get the drug to the target tissue.    - The proper method of use, as well as anticipated side effects, of a metered-dose inhaler are discussed and demonstrated to the patient. Improved effectiveness after extensive coaching during this visit to a level of approximately  75% so try qvar 80 2bid Rule of 2's reviewed   ? Sinus dz > low threshold for sinus CT   ? Allergy > no increase peripheral  eos at exac but  will consider testing later depending on response to rx  See instructions for specific recommendations which were reviewed directly with the patient who was given a copy with highlighter outlining the key components.

## 2014-07-24 ENCOUNTER — Encounter: Payer: Self-pay | Admitting: Internal Medicine

## 2014-07-24 ENCOUNTER — Ambulatory Visit (INDEPENDENT_AMBULATORY_CARE_PROVIDER_SITE_OTHER): Payer: 59 | Admitting: Internal Medicine

## 2014-07-24 VITALS — BP 122/80 | HR 63 | Ht 69.0 in | Wt 200.0 lb

## 2014-07-24 DIAGNOSIS — J4530 Mild persistent asthma, uncomplicated: Secondary | ICD-10-CM

## 2014-07-24 NOTE — Assessment & Plan Note (Signed)
The proper method of use, as well as anticipated side effects, of a metered-dose inhaler are discussed and demonstrated to the patient. Improved effectiveness after extensive coaching during this visit to a level of approximately  90%  All goals of chronic asthma control met including optimal function and elimination of symptoms with minimal need for rescue therapy.  Contingencies discussed in full including contacting this office immediately if not controlling the symptoms using the rule of two's.

## 2014-07-24 NOTE — Patient Instructions (Signed)
No change in meds  Please schedule a follow up visit in 3 months but call sooner if needed pfts on return

## 2014-07-24 NOTE — Progress Notes (Signed)
Subjective:    Patient ID: Rebecca Silva, female    DOB: 1971/05/09   MRN: [id number]    Brief patient profile:  44 yowf never smoker with tendency to bronchitis all her life sev times a year never required prednisone or breathing treatments until mid 41s but started then on prn saba  But never needed maint inhaler or more than twice weekly saba but since Dec 2014 needed it more often as much as twice daily then admitted:   Admit date: 06/06/2014 Discharge date: 06/08/2014     Recommendations for Outpatient Follow-up:  1. Follow up with PCP 1 week for evaluation of respiratory status. Consider pulmonary function tests when appropriate and possible referral to pulmonology. Follow TSH current level 4.3   Discharge Diagnoses:  Principal Problem:  Asthma exacerbation   Hypothyroidism  Leukocytosis  Viral URI with cough  Tachycardia     History of present illness:  Rebecca Silva is a 44 y.o. female started getting SOb and wheezing 06/06/14. Associated w/ congestion and cough. Husband also w/ recent URI. Mucinex w/o relief. Using albuterol Q3hrs w/ some improvement. Failed to improve. Presented 06/06/14 w/ typical asthma flare. Had come to ED earlier and received nebs and prednisone. Initially felt better but then worsened. Came back to ED for further evaluation. Complained of feeling like her heart rate was very high because of the albuterol  Hospital Course:  Asthma Exacerbation: likely started due to URI in setting of possible worsening disease. she reported needing to take "rescue" inhaler twice daily for at least last 2 months. Never had PFT. Provided with Solumedrol and nebs as well as steroid inhaler. Improved quickly and at discharge oxygen saturation level 94% on room air with ambulation. Will discharge with prednisone taper, nebulizer and steroid inhaler. Recommend follow up with PCP 1 week   Leukocytosis: Stress reaction compounded by steroids. She remained  febrile and non-toxic appearing. CXR w/o sign of pulmonary process.   Hypothyroidism: TSH 4.3.  Asthma: see #1. Concern for disease progression as increase in use of rescue inhaler. Will need PFT OP to assess stage of asthma. Have added steroid inhaler. Follow up with PCP      06/20/2014 1st [place] [facility] office visit/ Rebecca Silva  / referred by [facility] after above admit  Chief Complaint  Patient presents with  . Pulmonary Consult    Self referral. Pt states dxed with Asthma approx 10 yrs ago. She c/o SOB and cough since mid Nov 2015.  Her cough is prod with minimal yellow sputum.  She is using rescue inhaler on average 2 x per day.     much better since admit but note over use of saba at baseline and still doe x exertion but not at rest/ sleeping or adls rec Plan A = automatic = qvar 80 Take 2 puffs first thing in am and then another 2 puffs about 12 hours later (other option is dulera but probably don't need it)  Plan B = backup saba hfa Plan C= Crisis = nebulizer if plan A and B don't work  Plan D = Doctor, call me if not satisfied with the above   07/24/2014 f/u ov/Rebecca Silva re: chronic asthma since 30's now on qvar 80 2bid and pretty consistent with it Chief Complaint  Patient presents with  . Follow-up    Breathing has improved- using rescue inhaler 1-2 times per wk. Cough has resolved. No new co's today.     Best she's done in a long time,  no cough, no wheeze, Not limited by breathing from desired activities  And no neb use at all   No obvious day to day or daytime variabilty or assoc  cp or chest tightness, subjective wheeze overt sinus or hb symptoms. No unusual exp hx or h/o childhood pna/ asthma or knowledge of premature birth.  Sleeping ok without nocturnal  or early am exacerbation  of respiratory  c/o's or need for noct saba. Also denies any obvious fluctuation of symptoms with weather or environmental changes or other aggravating or alleviating factors except as outlined above    Current Medications, Allergies, Complete Past Medical History, Past Surgical History, Family History, and Social History were reviewed in [employer] record.  ROS  The following are not active complaints unless bolded sore throat, dysphagia, dental problems, itching, sneezing,  nasal congestion or excess/ purulent secretions, ear ache,   fever, chills, sweats, unintended wt loss, pleuritic or exertional cp, hemoptysis,  orthopnea pnd or leg swelling, presyncope, palpitations, heartburn, abdominal pain, anorexia, nausea, vomiting, diarrhea  or change in bowel or urinary habits, change in stools or urine, dysuria,hematuria,  rash, arthralgias, visual complaints, headache, numbness weakness or ataxia or problems with walking or coordination,  change in mood/affect or memory.                      Objective:   Physical Exam  amb wf nad   07/24/2014          200  Wt Readings from Last 3 Encounters:  06/20/14 199 lb (90.266 kg)  06/06/14 196 lb (88.905 kg)  06/06/14 196 lb (88.905 kg)    Vital signs reviewed  HEENT: nl dentition, turbinates, and orophanx. Nl external ear canals without cough reflex   NECK :  without JVD/Nodes/TM/ nl carotid upstrokes bilaterally   LUNGS: no acc muscle use, clear to A and P bilaterally without cough on insp or exp maneuvers   CV:  RRR  no s3 or murmur or increase in P2, no edema   ABD:  soft and nontender with nl excursion in the supine position. No bruits or organomegaly, bowel sounds nl  MS:  warm without deformities, calf tenderness, cyanosis or clubbing  SKIN: warm and dry without lesions    NEURO:  alert, approp, no deficits     cxr 06/06/14 No evidence of acute cardiopulmonary disease             Assessment & Plan:

## 2014-09-14 ENCOUNTER — Ambulatory Visit (INDEPENDENT_AMBULATORY_CARE_PROVIDER_SITE_OTHER): Payer: 59 | Admitting: Neurology

## 2014-09-14 ENCOUNTER — Encounter: Payer: Self-pay | Admitting: Neurology

## 2014-09-14 DIAGNOSIS — H5589 Other irregular eye movements: Secondary | ICD-10-CM

## 2014-09-14 NOTE — Progress Notes (Signed)
NEUROLOGY CONSULTATION NOTE  Rebecca Silva MRN: [id number] DOB: 1970/11/09  Referring provider: Dr. Chong Sicilian Primary care provider: Dr. Velta Addison  Reason for consult:  Eye flutter  HISTORY OF PRESENT ILLNESS: Rebecca Silva is a 44 year old right-handed woman with history of Hashimoto's thyroiditis and asthma who presents for eye flutter.  Records from optometrist reviewed.  She is accompanied by her husband who provides some history.  She has had these episodes of eye flutter at least since adolescence.  Suddenly, both of her eyes will roll back for one or two seconds.  She is not aware that it is happening.  It occurs several times a day, every few minutes.  It seems more noticeable now.  There is a question about whether she loses awareness during an episode.  If you start a sentence when it occurs, she is not aware of what you said.  If she is speaking, she will stop and say "um" when it occurs.  She denies headache or focal abnormalities.  She denies any abnormal movements or gait abnormalities.  She says that her mom has it but less severe.  Her grandfather had epilepsy.    PAST MEDICAL HISTORY: Past Medical History  Diagnosis Date  . Thyroid disease     hypothyroidism  . Asthma   . Allergy     seasonal    PAST SURGICAL HISTORY: No past surgical history on file.  MEDICATIONS: Current Outpatient Prescriptions on File Prior to Visit  Medication Sig Dispense Refill  . albuterol (VENTOLIN HFA) 108 (90 BASE) MCG/ACT inhaler Inhale 2 puffs into the lungs every 6 (six) hours as needed for wheezing.    . beclomethasone (QVAR) 80 MCG/ACT inhaler Take 2 puffs first thing in am and then another 2 puffs about 12 hours later. 1 Inhaler 11  . levothyroxine (SYNTHROID, LEVOTHROID) 150 MCG tablet Take 1 tablet (150 mcg total) by mouth daily before breakfast. 30 tablet 11   No current facility-administered medications on file prior to visit.    ALLERGIES: No Known Allergies  FAMILY  HISTORY: Family History  Problem Relation Age of Onset  . Heart disease Mother 82    open heart surgery  . Heart disease Maternal Grandmother   . Epilepsy Maternal Grandfather     SOCIAL HISTORY: History   Social History  . Marital Status: Married    Spouse Name: N/A  . Number of Children: N/A  . 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 Smoking status: Never Smoker   . Smokeless tobacco: Never Used  . Alcohol Use: No  . Drug Use: No  . Sexual Activity:    Partners: Female, Female   Other Topics Concern  . 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   Children: 19 yrs and 15 yrs   Regular exercise: walking/running 2 x a wk   Caffeine use: coffee daily, sweet tea     REVIEW OF SYSTEMS: Constitutional: No fevers, chills, or sweats, no generalized fatigue, change in appetite Eyes: No visual changes, double vision, eye pain Ear, nose and throat: No hearing loss, ear pain, nasal congestion, sore throat Cardiovascular: No chest pain, palpitations Respiratory:  No shortness of breath at rest or with exertion, wheezes GastrointestinaI: No nausea, vomiting, diarrhea, abdominal pain, fecal incontinence Genitourinary:  No dysuria, urinary retention or frequency Musculoskeletal:  No neck pain, back pain Integumentary:  No rash, pruritus, skin lesions Neurological: as above Psychiatric: No depression, insomnia, anxiety Endocrine: No palpitations, fatigue, diaphoresis, mood swings, change in appetite, change in weight, increased thirst Hematologic/Lymphatic:  No anemia, purpura, petechiae. Allergic/Immunologic: no itchy/runny eyes, nasal congestion, recent allergic reactions, rashes  PHYSICAL EXAM: Filed Vitals:   09/14/14 1356  BP: 140/60  Pulse: 76  Temp: 98.6 F (37 C)  Resp: 20   General: No acute distress Head:  Normocephalic/atraumatic Eyes:  fundi unremarkable, without vessel changes,  exudates, hemorrhages or papilledema.  I did witness the flutter.  Her eyes will roll back for a second.  It occurred while she was speaking and there was no break in her speech. Neck: supple, no paraspinal tenderness, full range of motion Back: No paraspinal tenderness Heart: regular rate and rhythm Lungs: Clear to auscultation bilaterally. Vascular: No carotid bruits. Neurological Exam: Mental status: alert and oriented to person, place, and time, recent and remote memory intact, fund of knowledge intact, attention and concentration intact, speech fluent and not dysarthric, language intact. Cranial nerves: CN I: not tested CN II: pupils equal, round and reactive to light, visual fields intact, fundi unremarkable, without vessel changes, exudates, hemorrhages or papilledema. CN III, IV, VI:  full range of motion, no nystagmus, no ptosis CN V: facial sensation intact CN VII: upper and lower face symmetric CN VIII: hearing intact CN IX, X: gag intact, uvula midline CN XI: sternocleidomastoid and trapezius muscles intact CN XII: tongue midline Bulk & Tone: normal, no fasciculations. Motor:  5/5 throughout Sensation:  Temperature and vibration intact Deep Tendon Reflexes:  2+ throughout, toes downgoing Finger to nose testing:  No dysmetria Heel to shin:  No dysmetria Gait:  Normal station and stride.  Able to turn and walk in tandem. Romberg negative.  IMPRESSION: Ocular flutter.  She has had this all her life so it is likely physiologic.  Her exam is normal.  I'm not quite sure if she truly loses awareness but since this is a concern, we should rule out seizures.  PLAN: We will get a sleep-deprived EEG.  If an episode is not captured, we can set up for a 24 hour ambulatory EEG. Further testing pending results  45 minutes spent with patient, over 50% spent discussing diagnoses and coordinating care.  Thank you for allowing me to take part in the care of this patient.  Metta Clines,  DO  CC:  Dillard Cannon, [place], DO

## 2014-09-14 NOTE — Patient Instructions (Signed)
I think it is benign but since there is a possibility of loss of awareness, we will check a sleep-deprived eeg.  We will call you with results and what next tests need to be pursued.

## 2014-09-19 ENCOUNTER — Telehealth: Payer: Self-pay | Admitting: Neurology

## 2014-09-19 ENCOUNTER — Telehealth: Payer: Self-pay | Admitting: *Deleted

## 2014-09-19 ENCOUNTER — Ambulatory Visit (INDEPENDENT_AMBULATORY_CARE_PROVIDER_SITE_OTHER): Payer: 59 | Admitting: Neurology

## 2014-09-19 ENCOUNTER — Other Ambulatory Visit: Payer: Self-pay | Admitting: *Deleted

## 2014-09-19 DIAGNOSIS — H5589 Other irregular eye movements: Secondary | ICD-10-CM

## 2014-09-19 NOTE — Procedures (Signed)
ELECTROENCEPHALOGRAM REPORT  Date of Study: 09/19/2014  Patient's Name: Rebecca Silva MRN: [id number] Date of Birth: October 26, 1970  Indication: 44 year old woman with longstanding history of brief episodes of eyes rolling back, possibly accompanied by loss of awareness.  Medications: Levothyroxine  Technical Summary: This is a multichannel digital EEG recording, using the international 10-20 placement system.  Spike detection software was employed.  Description: The EEG background is symmetric, with a well-developed posterior dominant rhythm of 9 Hz, which is reactive to eye opening and closing.  Diffuse beta activity is seen, with a bilateral frontal preponderance.  No focal or generalized abnormalities are seen.  No focal or generalized epileptiform discharges are seen.  Stage II sleep is seen, with normal and symmetric sleep patterns.  Hyperventilation and photic stimulation were performed, and produced no abnormalities.  ECG revealed normal cardiac rate and rhythm.  Impression: This is a normal routine EEG of the awake and asleep states, with activating procedures.  A normal study does not rule out the possibility of a seizure disorder in this patient.  Stevenson Windmiller R. Tomi Likens, DO

## 2014-09-19 NOTE — Telephone Encounter (Signed)
Left patient a message to sch 24 hour eeg per Dr Tomi Likens

## 2014-09-19 NOTE — Telephone Encounter (Signed)
Patient is aware that  24 EEG is needed and that front office will call with an appt

## 2014-09-21 NOTE — Telephone Encounter (Signed)
Pt has called back and scheduled appt

## 2014-10-10 ENCOUNTER — Ambulatory Visit (INDEPENDENT_AMBULATORY_CARE_PROVIDER_SITE_OTHER): Payer: 59 | Admitting: Endocrinology

## 2014-10-10 ENCOUNTER — Encounter: Payer: Self-pay | Admitting: Endocrinology

## 2014-10-10 VITALS — BP 122/78 | HR 75 | Temp 97.7°F | Ht 69.0 in | Wt 192.0 lb

## 2014-10-10 DIAGNOSIS — R252 Cramp and spasm: Secondary | ICD-10-CM | POA: Insufficient documentation

## 2014-10-10 DIAGNOSIS — E559 Vitamin D deficiency, unspecified: Secondary | ICD-10-CM | POA: Diagnosis not present

## 2014-10-10 DIAGNOSIS — E038 Other specified hypothyroidism: Secondary | ICD-10-CM | POA: Diagnosis not present

## 2014-10-10 LAB — VITAMIN D 25 HYDROXY (VIT D DEFICIENCY, FRACTURES): VITD: 15.19 ng/mL — AB (ref 30.00–100.00)

## 2014-10-10 LAB — TSH: TSH: 2.52 u[IU]/mL (ref 0.35–4.50)

## 2014-10-10 MED ORDER — VITAMIN D (ERGOCALCIFEROL) 1.25 MG (50000 UNIT) PO CAPS: 50000.0000 [IU] | ORAL_CAPSULE | ORAL | Status: DC

## 2014-10-10 NOTE — Progress Notes (Signed)
   Subjective:    Patient ID: Rebecca Silva, female    DOB: August 14, 1970, 44 y.o.   MRN: [id number]  HPI Pt was dx'ed with primary hypothyroidism in 2003.  She has been on thyroid hormone supplement since then.  She has been on the current dosage of 150 mcg/day, x approx 6 months.  This is the highest dosage she has ever taken.  She has slight leg cramps, and assoc unexplained crying. Past Medical History  Diagnosis Date  . Thyroid disease     hypothyroidism  . Asthma   . Allergy     seasonal    No past surgical history on file.  History   Social History  . Marital Status: Married    Spouse Name: N/A  . Number of Children: N/A  . 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 Smoking status: Never Smoker   . Smokeless tobacco: Never Used  . Alcohol Use: No  . Drug Use: No  . Sexual Activity:    Partners: Female, Female   Other Topics Concern  . 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 albuterol (VENTOLIN HFA) 108 (90 BASE) MCG/ACT inhaler Inhale 2 puffs into the lungs every 6 (six) hours as needed for wheezing.    . beclomethasone (QVAR) 80 MCG/ACT inhaler Take 2 puffs first thing in am and then another 2 puffs about 12 hours later. 1 Inhaler 11  . levothyroxine (SYNTHROID, LEVOTHROID) 150 MCG tablet Take 1 tablet (150 mcg total) by mouth daily before breakfast. 30 tablet 11   No current facility-administered medications on file prior to visit.    No Known Allergies  Family History  Problem Relation Age of Onset  . Heart disease Mother 88    open heart surgery  . Heart disease Maternal Grandmother   . Epilepsy Maternal Grandfather     BP 122/78 mmHg  Pulse 75  Temp(Src)  97.7 F (36.5 C) (Oral)  Ht 5\' 9"  (1.753 m)  Wt 192 lb (87.091 kg)  BMI 28.34 kg/m2  SpO2 97%  LMP 09/14/2014    Review of Systems Denies edema and weight change.    Objective:   Physical Exam VITAL SIGNS:  See vs page GENERAL: no distress NECK: There is no palpable thyroid enlargement.  No thyroid nodule is palpable.  No palpable lymphadenopathy at the anterior neck.   Lab Results  Component Value Date   TSH 2.52 10/10/2014   Lab Results  Component Value Date   PTH 55 10/10/2014   CALCIUM 8.9 10/10/2014  25-OH vit-D=15    Assessment & Plan:  Hypothyroidism: well-replaced Vit-D deficiency, new Cramps: ? Due to vit-D deficiency  Patient is advised the following: Patient Instructions  blood tests are being requested for you today.  We'll let you know about the results.   addendum: i have sent a prescription to your pharmacy, for vit-d

## 2014-10-10 NOTE — Patient Instructions (Signed)
blood tests are being requested for you today.  We'll let you know about the results.

## 2014-10-11 ENCOUNTER — Telehealth: Payer: Self-pay | Admitting: Neurology

## 2014-10-11 LAB — PTH, INTACT AND CALCIUM
Calcium: 8.9 mg/dL (ref 8.4–10.5)
PTH: 55 pg/mL (ref 14–64)

## 2014-10-11 NOTE — Telephone Encounter (Signed)
This call was not completed. It was restarted under Dr. Tomi Likens because in error I selected Dr. Delice Lesch when completing this form.

## 2014-10-11 NOTE — Telephone Encounter (Signed)
Called to offer sooner appointment for this coming Monday 3/28 from Monday 5/2. She stated she would like to keep it as it is.

## 2014-10-31 ENCOUNTER — Other Ambulatory Visit: Payer: Self-pay | Admitting: Internal Medicine

## 2014-10-31 DIAGNOSIS — R0600 Dyspnea, unspecified: Secondary | ICD-10-CM

## 2014-11-01 ENCOUNTER — Ambulatory Visit (INDEPENDENT_AMBULATORY_CARE_PROVIDER_SITE_OTHER): Payer: 59 | Admitting: Internal Medicine

## 2014-11-01 ENCOUNTER — Encounter: Payer: Self-pay | Admitting: Internal Medicine

## 2014-11-01 VITALS — BP 114/74 | HR 74 | Ht 69.0 in | Wt 199.0 lb

## 2014-11-01 DIAGNOSIS — J4540 Moderate persistent asthma, uncomplicated: Secondary | ICD-10-CM

## 2014-11-01 DIAGNOSIS — R0600 Dyspnea, unspecified: Secondary | ICD-10-CM | POA: Diagnosis not present

## 2014-11-01 LAB — PULMONARY FUNCTION TEST
DL/VA % PRED: 100 %
DL/VA: 5.35 ml/min/mmHg/L
DLCO unc % pred: 80 %
DLCO unc: 24.9 ml/min/mmHg
FEF 25-75 POST: 2.62 L/s
FEF 25-75 Pre: 1.14 L/sec
FEF2575-%Change-Post: 130 %
FEF2575-%PRED-POST: 81 %
FEF2575-%Pred-Pre: 35 %
FEV1-%Change-Post: 33 %
FEV1-%PRED-POST: 69 %
FEV1-%Pred-Pre: 52 %
FEV1-PRE: 1.76 L
FEV1-Post: 2.36 L
FEV1FVC-%Change-Post: 11 %
FEV1FVC-%PRED-PRE: 82 %
FEV6-%Change-Post: 20 %
FEV6-%PRED-PRE: 63 %
FEV6-%Pred-Post: 76 %
FEV6-POST: 3.16 L
FEV6-Pre: 2.63 L
FEV6FVC-%Change-Post: 0 %
FEV6FVC-%Pred-Post: 101 %
FEV6FVC-%Pred-Pre: 102 %
FVC-%CHANGE-POST: 19 %
FVC-%PRED-PRE: 62 %
FVC-%Pred-Post: 74 %
FVC-Post: 3.17 L
FVC-Pre: 2.65 L
PRE FEV6/FVC RATIO: 100 %
Post FEV1/FVC ratio: 74 %
Post FEV6/FVC ratio: 100 %
Pre FEV1/FVC ratio: 67 %
RV % pred: 117 %
RV: 2.31 L
TLC % pred: 82 %
TLC: 4.81 L

## 2014-11-01 MED ORDER — BUDESONIDE-FORMOTEROL FUMARATE 160-4.5 MCG/ACT IN AERO: INHALATION_SPRAY | RESPIRATORY_TRACT | Status: DC

## 2014-11-01 NOTE — Patient Instructions (Signed)
Change qvar to symbicort 160 Take 2 puffs first thing in am and then another 2 puffs about 12 hours later.   Only use your albuterol as a rescue medication to be used if you can't catch your breath by resting or doing a relaxed purse lip breathing pattern.  - The less you use it, the better it will work when you need it. - Ok to use up to 2 puffs  every 4 hours if you must but call for immediate appointment if use goes up over your usual need - Don't leave home without it !!  (think of it like the spare tire for your car)   Please schedule a follow up visit in 3 months but call sooner if needed

## 2014-11-01 NOTE — Progress Notes (Signed)
Subjective:    Patient ID: Rebecca Silva, female    DOB: May 11, 1971   MRN: [id number]    Brief patient profile:  14 yowf never smoker with tendency to bronchitis all her life sev times a year never required prednisone or breathing treatments until mid 36s but started then on prn saba  But never needed maint inhaler or more than twice weekly saba but since Dec 2014 needed it more often as much as twice daily then admitted:   Admit date: 06/06/2014 Discharge date: 06/08/2014   Discharge Diagnoses:  Principal Problem:  Asthma exacerbation   Hypothyroidism  Leukocytosis  Viral URI with cough  Tachycardia     History of present illness:  Rebecca Silva is a 44 y.o. female started getting SOb and wheezing 06/06/14. Associated w/ congestion and cough. Husband also w/ recent URI. Mucinex w/o relief. Using albuterol Q3hrs w/ some improvement. Failed to improve. Presented 06/06/14 w/ typical asthma flare. Had come to ED earlier and received nebs and prednisone. Initially felt better but then worsened. Came back to ED for further evaluation. Complained of feeling like her heart rate was very high because of the albuterol  Hospital Course:  Asthma Exacerbation: likely started due to URI in setting of possible worsening disease. she reported needing to take "rescue" inhaler twice daily for at least last 2 months. Never had PFT. Provided with Solumedrol and nebs as well as steroid inhaler. Improved quickly and at discharge oxygen saturation level 94% on room air with ambulation. Will discharge with prednisone taper, nebulizer and steroid inhaler. Recommend follow up with PCP 1 week   Leukocytosis: Stress reaction compounded by steroids. She remained febrile and non-toxic appearing. CXR w/o sign of pulmonary process.   Hypothyroidism: TSH 4.3.  Asthma: see #1. Concern for disease progression as increase in use of rescue inhaler. Will need PFT OP to assess stage of asthma. Have  added steroid inhaler. Follow up with PCP     History of Present Illness  06/20/2014 1st [facility] office visit/ Rebecca Silva  / referred by [facility] after above admit  Chief Complaint  Patient presents with  . Pulmonary Consult    Self referral. Pt states dxed with Asthma approx 10 yrs ago. She c/o SOB and cough since mid Nov 2015.  Her cough is prod with minimal yellow sputum.  She is using rescue inhaler on average 2 x per day.     much better since admit but note over use of saba at baseline and still doe x exertion but not at rest/ sleeping or adls rec Plan A = automatic = qvar 80 Take 2 puffs first thing in am and then another 2 puffs about 12 hours later (other option is dulera but probably don't need it)  Plan B = backup saba hfa Plan C= Crisis = nebulizer if plan A and B don't work  Plan D = Doctor, call me if not satisfied with the above   07/24/2014 f/u ov/Vernal Rutan re: chronic asthma since 30's now on qvar 80 2bid and pretty consistent with it Chief Complaint  Patient presents with  . Follow-up    Breathing has improved- using rescue inhaler 1-2 times per wk. Cough has resolved. No new co's today.   Best she's done in a long time, no cough, no wheeze, Not limited by breathing from desired activities  And no neb use at all rec No change rx   11/01/2014 f/u ov/Rebecca Silva re: moderate persistent asthma  qvar 80 2  bid / more sob than cough  Chief Complaint  Patient presents with  . Follow-up    PFT done today. Pt states that her breathing is unchanged since her last visit. She uses rescue inhaler 1-2 x per wk. She c/o Qvar sometimes making her feel that she is about to have an asthma attack.   marked decrease in need for saba since starting qvar / no noct flares   No obvious day to day or daytime variabilty or assoc  cp or chest tightness, subjective wheeze overt sinus or hb symptoms. No unusual exp hx or h/o childhood pna/ asthma or knowledge of premature birth.  Sleeping ok without  nocturnal  or early am exacerbation  of respiratory  c/o's or need for noct saba. Also denies any obvious fluctuation of symptoms with weather or environmental changes or other aggravating or alleviating factors except as outlined above   Current Medications, Allergies, Complete Past Medical History, Past Surgical History, Family History, and Social History were reviewed in [employer] record.  ROS  The following are not active complaints unless bolded sore throat, dysphagia, dental problems, itching, sneezing,  nasal congestion or excess/ purulent secretions, ear ache,   fever, chills, sweats, unintended wt loss, pleuritic or exertional cp, hemoptysis,  orthopnea pnd or leg swelling, presyncope, palpitations, heartburn, abdominal pain, anorexia, nausea, vomiting, diarrhea  or change in bowel or urinary habits, change in stools or urine, dysuria,hematuria,  rash, arthralgias, visual complaints, headache, numbness weakness or ataxia or problems with walking or coordination,  change in mood/affect or memory.                      Objective:   Physical Exam  amb wf nad   07/24/2014          200  > 11/01/2014  199  Wt Readings from Last 3 Encounters:  06/20/14 199 lb (90.266 kg)  06/06/14 196 lb (88.905 kg)  06/06/14 196 lb (88.905 kg)    Vital signs reviewed  HEENT: nl dentition, turbinates, and orophanx. Nl external ear canals without cough reflex   NECK :  without JVD/Nodes/TM/ nl carotid upstrokes bilaterally   LUNGS: no acc muscle use, clear to A and P bilaterally without cough on insp or exp maneuvers   CV:  RRR  no s3 or murmur or increase in P2, no edema   ABD:  soft and nontender with nl excursion in the supine position. No bruits or organomegaly, bowel sounds nl  MS:  warm without deformities, calf tenderness, cyanosis or clubbing  SKIN: warm and dry without lesions    NEURO:  alert, approp, no deficits     cxr 06/06/14 No evidence of acute  cardiopulmonary disease             Assessment & Plan:

## 2014-11-01 NOTE — Assessment & Plan Note (Addendum)
-   07/24/2014   > trial of qvar 80 2bid - PFTs 11/01/2014 FEV1  2.36 (69%) ratio 74 p 33% improvement from saba/ dlco 80% -  11/01/2014 p extensive coaching HFA effectiveness =    75% > try symbicort 160 2bid    I had an extended discussion with the patient reviewing all relevant studies completed to date and  lasting 15 to 20 minutes of a 25 minute visit on the following ongoing concerns:  1) she is more moderate than severe asthma per pfts today   2) per guidelines since already on max qvar rec laba/ICS for now  3) teaching done for optimal hfa/ needs  To keep working on it   3) Each maintenance medication was reviewed in detail including most importantly the difference between maintenance and as needed and under what circumstances the prns are to be used.  Please see instructions for details which were reviewed in writing and the patient given a copy.

## 2014-11-01 NOTE — Progress Notes (Signed)
PFT done today. 

## 2014-11-19 ENCOUNTER — Ambulatory Visit (INDEPENDENT_AMBULATORY_CARE_PROVIDER_SITE_OTHER): Payer: 59 | Admitting: Neurology

## 2014-11-19 DIAGNOSIS — H5589 Other irregular eye movements: Secondary | ICD-10-CM

## 2014-11-21 ENCOUNTER — Telehealth: Payer: Self-pay | Admitting: Neurology

## 2014-11-21 ENCOUNTER — Other Ambulatory Visit: Payer: 59

## 2014-11-21 NOTE — Telephone Encounter (Signed)
Left message for Rebecca Silva to call the office to discuss EEG results.

## 2014-11-21 NOTE — Telephone Encounter (Signed)
I spoke with Rebecca Silva regarding the EEG results.  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  I recommended starting an anti-epileptic medication and to get an MRI of the brain.  She does not yet have a follow up appointment.  She would like to discuss it further in the office.  She says she will call the office to schedule an appointment.

## 2014-11-21 NOTE — Procedures (Signed)
ELECTROENCEPHALOGRAM REPORT  Date of Study: 11/19/2014 to 11/20/2014  Patient's Name: Rebecca Silva MRN: [id number] Date of Birth: [birth year]  Indication: Longstanding history of brief episodes of eye flutter with questionable loss of awareness.  It has been getting more frequent.  Medications: Levothyroxine  Technical Summary: This is a multichannel digital EEG recording, using the international 10-20 placement system.    Description: The EEG background is symmetric, with a well-developed posterior dominant rhythm of 8-9 Hz, which is reactive to eye opening and closing.  Diffuse beta activity is seen, with a bilateral frontal preponderance.  Periodic brief bursts of generalized slowing with low-amplitude imbedded sharps seen, lasting 2 to 3 seconds.  As video is not always on patient's face, it is unclear if any of these findings correlate with eye flutter.  Stage II sleep is seen, with normal and symmetric sleep patterns.  Hyperventilation and photic stimulation were not performed.  ECG revealed normal cardiac rate and rhythm.  Impression: This is an abnormal 24 hour ambulatory video EEG due to intermittent brief bursts of generalized slowing with imbedded sharp waves, suggestive of primary generalized epilepsy.   Adam R. Tomi Likens, DO

## 2014-11-21 NOTE — Telephone Encounter (Signed)
Pt states that she is returning Dr Tomi Likens phone call please call her at [phone]

## 2014-11-21 NOTE — Telephone Encounter (Signed)
Pt states that she is returning Dr Tomi Likens phone call please call her at [phone]

## 2014-11-26 ENCOUNTER — Encounter: Payer: Self-pay | Admitting: Neurology

## 2014-11-26 ENCOUNTER — Ambulatory Visit (INDEPENDENT_AMBULATORY_CARE_PROVIDER_SITE_OTHER): Payer: 59 | Admitting: Neurology

## 2014-11-26 VITALS — BP 106/70 | HR 64 | Resp 18 | Ht 69.0 in | Wt 195.6 lb

## 2014-11-26 DIAGNOSIS — G40319 Generalized idiopathic epilepsy and epileptic syndromes, intractable, without status epilepticus: Secondary | ICD-10-CM | POA: Diagnosis not present

## 2014-11-26 MED ORDER — LEVETIRACETAM 500 MG PO TABS: 500.0000 mg | ORAL_TABLET | Freq: Two times a day (BID) | ORAL | Status: DC

## 2014-11-26 NOTE — Progress Notes (Signed)
NEUROLOGY FOLLOW UP OFFICE NOTE  Rebecca Silva [id number]  HISTORY OF PRESENT ILLNESS: Rebecca Silva is a 44 year old right-handed woman with history of Hashimoto's thyroiditis and asthma who follows up for eye flutter.  She is accompanied by her husband who provides some history.  EEG reviewed.  UPDATE: She had an ambulatory EEG last week which showed brief intermittent bursts of generalized slowing with imbedded sharp waves, suggestive of primary generalized epilepsy.    HISTORY: She has had these episodes of eye flutter at least since adolescence.  Suddenly, both of her eyes will roll back for one or two seconds.  She is not aware that it is happening.  It occurs several times a day, every few minutes.  It seems more noticeable now.  There is a question about whether she loses awareness during an episode.  If you start a sentence when it occurs, she is not aware of what you said.  If she is speaking, she will stop and say "um" when it occurs.  She denies headache or focal abnormalities.  She denies any abnormal movements or gait abnormalities.  She says that her mom has it but less severe.  Her grandfather had epilepsy.   PAST MEDICAL HISTORY: Past Medical History  Diagnosis Date  . Thyroid disease     hypothyroidism  . Asthma   . Allergy     seasonal    MEDICATIONS: Current Outpatient Prescriptions on File Prior to Visit  Medication Sig Dispense Refill  . albuterol (VENTOLIN HFA) 108 (90 BASE) MCG/ACT inhaler Inhale 2 puffs into the lungs every 6 (six) hours as needed for wheezing.    . budesonide-formoterol (SYMBICORT) 160-4.5 MCG/ACT inhaler Take 2 puffs first thing in am and then another 2 puffs about 12 hours later. 1 Inhaler 11  . levothyroxine (SYNTHROID, LEVOTHROID) 150 MCG tablet Take 1 tablet (150 mcg total) by mouth daily before breakfast. 30 tablet 11  . Vitamin D, Ergocalciferol, (DRISDOL) 50000 UNITS CAPS capsule Take 1 capsule (50,000 Units total) by mouth every  7 (seven) days. 8 capsule 0   No current facility-administered medications on file prior to visit.    ALLERGIES: No Known Allergies  FAMILY HISTORY: Family History  Problem Relation Age of Onset  . Heart disease Mother 63    open heart surgery  . Heart disease Maternal Grandmother   . Epilepsy Maternal Grandfather     SOCIAL HISTORY: History   Social History  . Marital Status: Married    Spouse Name: N/A  . Number of Children: N/A  . 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 Smoking status: Never Smoker   . Smokeless tobacco: Never Used  . Alcohol Use: No  . Drug Use: No  . Sexual Activity:    Partners: Male   Other Topics Concern  . 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   Children: 19 yrs and 15 yrs   Regular exercise: walking/running 2 x a wk   Caffeine use: coffee daily, sweet tea     REVIEW OF SYSTEMS: Constitutional: No fevers, chills, or sweats, no generalized fatigue, change in appetite Eyes: No visual changes, double vision, eye pain Ear, nose and throat: No hearing loss, ear pain, nasal congestion, sore throat Cardiovascular: No chest pain, palpitations Respiratory:  No shortness of breath at rest or with exertion, wheezes GastrointestinaI: No nausea,  vomiting, diarrhea, abdominal pain, fecal incontinence Genitourinary:  No dysuria, urinary retention or frequency Musculoskeletal:  No neck pain, back pain Integumentary: No rash, pruritus, skin lesions Neurological: as above Psychiatric: No depression, insomnia, anxiety Endocrine: No palpitations, fatigue, diaphoresis, mood swings, change in appetite, change in weight, increased thirst Hematologic/Lymphatic:  No anemia, purpura, petechiae. Allergic/Immunologic: no itchy/runny eyes, nasal congestion, recent allergic reactions, rashes  PHYSICAL EXAM: Filed Vitals:   11/26/14 1355  BP: 106/70  Pulse: 64  Resp: 18    General: No acute distress Head:  Normocephalic/atraumatic Eyes:  Fundoscopic exam unremarkable without vessel changes, exudates, hemorrhages or papilledema. Neck: supple, no paraspinal tenderness, full range of motion Heart:  Regular rate and rhythm Lungs:  Clear to auscultation bilaterally Back: No paraspinal tenderness Neurological Exam: alert and oriented to person, place, and time. Attention span and concentration intact, recent and remote memory intact, fund of knowledge intact.  Speech fluent and not dysarthric, language intact.  CN II-XII intact. Fundoscopic exam unremarkable without vessel changes, exudates, hemorrhages or papilledema.  Bulk and tone normal, muscle strength 5/5 throughout.  Sensation to light touch, temperature and vibration intact.  Deep tendon reflexes 2+ throughout, toes downgoing.  Finger to nose and heel to shin testing intact.  Gait normal.  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  It has been undiagnosed and untreated.  PLAN: 1.  Will start Keppra 500mg  twice daily.  Side effects discussed including black box warning regarding suicidal ideation.  She will call in 1 week with update. 2.  Will get MRI of brain 3.  Follow up in 3 months.  15 minutes spent with patient, over 50% spent discussing management.  Metta Clines, DO  CC:  Darlys Gales, DO

## 2014-11-26 NOTE — Patient Instructions (Addendum)
1.  Start Keppra 500mg  twice daily.  Call in 1 week with update and we can adjust dose if needed. 2.  We will need MRI of brain. [facility]  12/13/14 2:45pm 3.  Follow up in 3 months.

## 2014-11-28 ENCOUNTER — Other Ambulatory Visit (INDEPENDENT_AMBULATORY_CARE_PROVIDER_SITE_OTHER): Payer: 59

## 2014-11-28 DIAGNOSIS — E559 Vitamin D deficiency, unspecified: Secondary | ICD-10-CM

## 2014-11-29 ENCOUNTER — Other Ambulatory Visit: Payer: Self-pay | Admitting: Endocrinology

## 2014-11-29 DIAGNOSIS — E559 Vitamin D deficiency, unspecified: Secondary | ICD-10-CM

## 2014-11-29 LAB — VITAMIN D 25 HYDROXY (VIT D DEFICIENCY, FRACTURES): VITD: 18.28 ng/mL — ABNORMAL LOW (ref 30.00–100.00)

## 2014-11-29 MED ORDER — VITAMIN D (ERGOCALCIFEROL) 1.25 MG (50000 UNIT) PO CAPS
ORAL_CAPSULE | ORAL | Status: DC
Start: 2014-11-29 — End: 2015-02-08

## 2014-12-05 ENCOUNTER — Telehealth: Payer: Self-pay | Admitting: Neurology

## 2014-12-05 NOTE — Telephone Encounter (Signed)
Message forwarded to Dr Tomi Likens

## 2014-12-05 NOTE — Telephone Encounter (Signed)
Pt states that she started the medication a week ago and it seems to be doing well. She has not had any problems and her family can tell a difference pt phone number is [phone]

## 2014-12-13 ENCOUNTER — Ambulatory Visit (HOSPITAL_COMMUNITY)
Admission: RE | Admit: 2014-12-13 | Discharge: 2014-12-13 | Disposition: A | Discharge status: Home / Self Care | Payer: 59 | Source: Ambulatory Visit | Attending: Neurology | Admitting: Neurology

## 2014-12-13 DIAGNOSIS — G40319 Generalized idiopathic epilepsy and epileptic syndromes, intractable, without status epilepticus: Secondary | ICD-10-CM

## 2014-12-13 DIAGNOSIS — G40909 Epilepsy, unspecified, not intractable, without status epilepticus: Secondary | ICD-10-CM | POA: Diagnosis not present

## 2014-12-14 ENCOUNTER — Telehealth: Payer: Self-pay | Admitting: *Deleted

## 2014-12-14 ENCOUNTER — Telehealth: Payer: Self-pay | Admitting: Neurology

## 2014-12-14 NOTE — Telephone Encounter (Signed)
Krystin returned your call, please call her back at work [phone]

## 2014-12-14 NOTE — Telephone Encounter (Signed)
Please let pt know that MRI brain looks fine and no abnormality to acct for seizure. Tell her that radiologist did mention extra lymph nodes around parotid (salivary gland) and should f/u with PCP for that as out of our field. Send PCP copy of MRI patient is aware

## 2014-12-14 NOTE — Telephone Encounter (Signed)
Patient is aware of MRI findings Please let pt know that MRI brain looks fine and no abnormality to acct for seizure. Tell her that radiologist did mention extra lymph nodes around parotid (salivary gland) and should f/u with PCP for that as out of our field. Send PCP copy of MRI

## 2014-12-14 NOTE — Telephone Encounter (Signed)
-----   Message from Wharton, DO sent at 12/14/2014  7:44 AM EDT ----- Please let pt know that MRI brain looks fine and no abnormality to acct for seizure.  Tell her that radiologist did mention extra lymph nodes around parotid (salivary gland) and should f/u with PCP for that as out of our field.  Send PCP copy of MRI

## 2014-12-14 NOTE — Telephone Encounter (Signed)
Patient is going to see her endocrinologist for the lymph node finding

## 2014-12-18 ENCOUNTER — Telehealth: Payer: Self-pay | Admitting: Neurology

## 2014-12-18 ENCOUNTER — Encounter: Payer: Self-pay | Admitting: Endocrinology

## 2014-12-18 ENCOUNTER — Ambulatory Visit (INDEPENDENT_AMBULATORY_CARE_PROVIDER_SITE_OTHER): Payer: 59 | Admitting: Endocrinology

## 2014-12-18 VITALS — BP 126/86 | HR 69 | Temp 98.3°F | Ht 69.0 in | Wt 197.0 lb

## 2014-12-18 DIAGNOSIS — R591 Generalized enlarged lymph nodes: Secondary | ICD-10-CM | POA: Insufficient documentation

## 2014-12-18 DIAGNOSIS — E559 Vitamin D deficiency, unspecified: Secondary | ICD-10-CM

## 2014-12-18 DIAGNOSIS — E038 Other specified hypothyroidism: Secondary | ICD-10-CM

## 2014-12-18 NOTE — Progress Notes (Signed)
Subjective:    Patient ID: Rebecca Silva, female    DOB: 10/25/1970, 44 y.o.   MRN: [id number]  HPI Pt was dx'ed with primary hypothyroidism in 2003.  She has been on thyroid hormone supplement since then.  She has been on the current dosage of 150 mcg/day, x approx 6 months.  This is the highest dosage she has ever taken.  She has slight leg cramps, and assoc unexplained crying. pt recently had head CT.  It showed slight lymphadenopathy of the periparotid area, but no assoc glandular enlargement.  Leg cramps persists, but are less now.  She has heavy menses.  She seldom has seizures.  Past Medical History  Diagnosis Date  . Thyroid disease     hypothyroidism  . Asthma   . Allergy     seasonal    No past surgical history on file.  History   Social History  . Marital Status: Married    Spouse Name: N/A  . Number of Children: N/A  . 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 Smoking status: Never Smoker   . Smokeless tobacco: Never Used  . Alcohol Use: No  . Drug Use: No  . Sexual Activity:    Partners: Male   Other Topics Concern  . 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 albuterol (VENTOLIN HFA) 108 (90 BASE) MCG/ACT inhaler Inhale 2 puffs into the lungs every 6 (six) hours as needed for wheezing.    . budesonide-formoterol (SYMBICORT) 160-4.5 MCG/ACT inhaler Take 2 puffs first thing in am and then another 2 puffs about 12 hours later. 1 Inhaler 11  . levETIRAcetam (KEPPRA) 500 MG tablet Take 1 tablet (500 mg total) by mouth 2 (two) times daily. 60 tablet 5  . levothyroxine (SYNTHROID, LEVOTHROID) 150 MCG tablet Take 1 tablet (150 mcg total) by mouth daily before breakfast.  30 tablet 11  . QVAR 80 MCG/ACT inhaler     . Vitamin D, Ergocalciferol, (DRISDOL) 50000 UNITS CAPS capsule 1 tab, 3 times per week 12 capsule 0   No current facility-administered medications on file prior to visit.    No Known Allergies  Family History  Problem Relation Age of Onset  . Heart disease Mother 66    open heart surgery  . Heart disease Maternal Grandmother   . Epilepsy Maternal Grandfather     BP 126/86 mmHg  Pulse 69  Temp(Src) 98.3 F (36.8 C) (Oral)  Ht 5\' 9"  (1.753 m)  Wt 197 lb (89.359 kg)  BMI 29.08 kg/m2  SpO2 98%  LMP 11/24/2014  Review of Systems Fatigue persists.  Denies fever.    Objective:   Physical Exam VITAL SIGNS:  See vs page.   GENERAL: no distress. NECK: There is no palpable thyroid enlargement.  No thyroid nodule is palpable.  No palpable lymphadenopathy at the anterior neck or face.  Skin: normal texture and temp.  Gait: normal and steady. PSYCH: Alert and well-oriented.  Does not appear anxious nor depressed.     Radiol: i reviewed MRI report    Assessment & Plan:  Lymphadenopathy, new, uncertain etiology Vit-D  deficiency: now on increased rx.  Pt is advised to continue same for now. Epilepsy: this may improve with rx of vit-D deficiency.  Patient is advised the following: Patient Instructions  Please recheck the vitamin-D in late June.   Please see an ear-nose-throat specialist.  you will receive a phone call, about a day and time for an appointment.

## 2014-12-18 NOTE — Telephone Encounter (Signed)
Pt  Came in to the office today asking to speak with susie but had to leave before susie was able to get up there. She would like susie to call her at [phone]

## 2014-12-18 NOTE — Telephone Encounter (Signed)
Patient is aware that she needs to Increase to 1000mg  twice daily.

## 2014-12-18 NOTE — Telephone Encounter (Signed)
Increase to 1000mg  twice daily.

## 2014-12-18 NOTE — Patient Instructions (Signed)
Please recheck the vitamin-D in late June.   Please see an ear-nose-throat specialist.  you will receive a phone call, about a day and time for an appointment.

## 2014-12-18 NOTE — Telephone Encounter (Signed)
Patient states she is having more eye episodes she is asking if her medication needs to be increased

## 2014-12-24 ENCOUNTER — Other Ambulatory Visit: Payer: Self-pay | Admitting: *Deleted

## 2014-12-24 MED ORDER — LEVETIRACETAM 500 MG PO TABS: ORAL_TABLET | ORAL | Status: DC

## 2014-12-24 NOTE — Telephone Encounter (Signed)
Per Dr. Starr Lake increase to 1000 mg bid

## 2014-12-26 ENCOUNTER — Telehealth: Payer: Self-pay | Admitting: Internal Medicine

## 2014-12-26 MED ORDER — BUDESONIDE-FORMOTEROL FUMARATE 160-4.5 MCG/ACT IN AERO: INHALATION_SPRAY | RESPIRATORY_TRACT | Status: DC

## 2014-12-26 NOTE — Telephone Encounter (Signed)
[phone], pt cb

## 2014-12-26 NOTE — Telephone Encounter (Signed)
lmtcb x1 for pt. 

## 2014-12-26 NOTE — Telephone Encounter (Signed)
Spoke with pt and advised that rx refill for Symbicort was sent to pharmacy.

## 2015-01-04 ENCOUNTER — Other Ambulatory Visit: Payer: Self-pay | Admitting: Otolaryngology

## 2015-01-04 DIAGNOSIS — R590 Localized enlarged lymph nodes: Secondary | ICD-10-CM

## 2015-01-10 ENCOUNTER — Ambulatory Visit
Admission: RE | Admit: 2015-01-10 | Discharge: 2015-01-10 | Disposition: A | Discharge status: Home / Self Care | Payer: 59 | Source: Ambulatory Visit | Attending: Otolaryngology | Admitting: Otolaryngology

## 2015-01-10 DIAGNOSIS — R590 Localized enlarged lymph nodes: Secondary | ICD-10-CM

## 2015-01-17 ENCOUNTER — Other Ambulatory Visit: Payer: 59

## 2015-01-23 ENCOUNTER — Other Ambulatory Visit (INDEPENDENT_AMBULATORY_CARE_PROVIDER_SITE_OTHER): Payer: 59

## 2015-01-23 DIAGNOSIS — E559 Vitamin D deficiency, unspecified: Secondary | ICD-10-CM

## 2015-01-23 DIAGNOSIS — E038 Other specified hypothyroidism: Secondary | ICD-10-CM | POA: Diagnosis not present

## 2015-01-23 LAB — VITAMIN D 25 HYDROXY (VIT D DEFICIENCY, FRACTURES): VITD: 39.73 ng/mL (ref 30.00–100.00)

## 2015-01-23 LAB — TSH: TSH: 4.17 u[IU]/mL (ref 0.35–4.50)

## 2015-02-08 ENCOUNTER — Encounter: Payer: Self-pay | Admitting: Internal Medicine

## 2015-02-08 ENCOUNTER — Ambulatory Visit (INDEPENDENT_AMBULATORY_CARE_PROVIDER_SITE_OTHER): Payer: 59 | Admitting: Internal Medicine

## 2015-02-08 VITALS — BP 126/74 | HR 70 | Ht 68.5 in | Wt 197.6 lb

## 2015-02-08 DIAGNOSIS — J4540 Moderate persistent asthma, uncomplicated: Secondary | ICD-10-CM

## 2015-02-08 NOTE — Patient Instructions (Signed)
If the drugstore doesn't honor your zero coupon please let me know   Continue symbicort 160 Take 2 puffs first thing in am and then another 2 puffs about 12 hours later.   Only use your albuterol as a rescue medication to be used if you can't catch your breath by resting or doing a relaxed purse lip breathing pattern.  - The less you use it, the better it will work when you need it. - Ok to use up to 2 puffs  every 4 hours if you must but call for immediate appointment if use goes up over your usual need - Don't leave home without it !!  (think of it like the spare tire for your car)   Follow up at 6 months if not established with primary care here

## 2015-02-08 NOTE — Progress Notes (Signed)
Subjective:    Patient ID: Rebecca Silva, female    DOB: 12/05/1970   MRN: [id number]    Brief patient profile:  38 yowf never smoker with tendency to bronchitis all her life sev times a year never required prednisone or breathing treatments until mid 49s but started then on prn saba  But never needed maint inhaler or more than twice weekly saba but since Dec 2014 needed it more often as much as twice daily then admitted:   Admit date: 06/06/2014 Discharge date: 06/08/2014   Discharge Diagnoses:  Principal Problem:  Asthma exacerbation   Hypothyroidism  Leukocytosis  Viral URI with cough  Tachycardia     History of present illness:  Rebecca Silva is a 44 y.o. female started getting SOb and wheezing 06/06/14. Associated w/ congestion and cough. Husband also w/ recent URI. Mucinex w/o relief. Using albuterol Q3hrs w/ some improvement. Failed to improve. Presented 06/06/14 w/ typical asthma flare. Had come to ED earlier and received nebs and prednisone. Initially felt better but then worsened. Came back to ED for further evaluation. Complained of feeling like her heart rate was very high because of the albuterol  Hospital Course:  Asthma Exacerbation: likely started due to URI in setting of possible worsening disease. she reported needing to take "rescue" inhaler twice daily for at least last 2 months. Never had PFT. Provided with Solumedrol and nebs as well as steroid inhaler. Improved quickly and at discharge oxygen saturation level 94% on room air with ambulation. Will discharge with prednisone taper, nebulizer and steroid inhaler. Recommend follow up with PCP 1 week   Leukocytosis: Stress reaction compounded by steroids. She remained febrile and non-toxic appearing. CXR w/o sign of pulmonary process.   Hypothyroidism: TSH 4.3.  Asthma: see #1. Concern for disease progression as increase in use of rescue inhaler. Will need PFT OP to assess stage of asthma. Have  added steroid inhaler. Follow up with PCP     History of Present Illness  06/20/2014 1st [facility] office visit/ [name]  / referred by [facility] after above admit  Chief Complaint  Patient presents with  . Pulmonary Consult    Self referral. Pt states dxed with Asthma approx 10 yrs ago. She c/o SOB and cough since mid Nov 2015.  Her cough is prod with minimal yellow sputum.  She is using rescue inhaler on average 2 x per day.     much better since admit but note over use of saba at baseline and still doe x exertion but not at rest/ sleeping or adls rec Plan A = automatic = qvar 80 Take 2 puffs first thing in am and then another 2 puffs about 12 hours later (other option is dulera but probably don't need it)  Plan B = backup saba hfa Plan C= Crisis = nebulizer if plan A and B don't work  Plan D = Doctor, call me if not satisfied with the above   07/24/2014 f/u ov/Rebecca Silva re: chronic asthma since 30's now on qvar 80 2bid and pretty consistent with it Chief Complaint  Patient presents with  . Follow-up    Breathing has improved- using rescue inhaler 1-2 times per wk. Cough has resolved. No new co's today.   Best she's done in a long time, no cough, no wheeze, Not limited by breathing from desired activities  And no neb use at all rec No change rx   11/01/2014 f/u ov/Rebecca Silva re: moderate persistent asthma  qvar 80 2  bid / more sob than cough  Chief Complaint  Patient presents with  . Follow-up    PFT done today. Pt states that her breathing is unchanged since her last visit. She uses rescue inhaler 1-2 x per wk. She c/o Qvar sometimes making her feel that she is about to have an asthma attack.   marked decrease in need for saba since starting qvar / no noct flares  rec Change qvar to symbicort 160 Take 2 puffs first thing in am and then another 2 puffs about 12 hours later.  Only use your albuterol as a rescue medication     02/08/2015 f/u ov/Rebecca Silva re: chronic asthma  Chief Complaint    Patient presents with  . Follow-up    Pt states breathing is doing well. She uses albuterol about once per month.      Not limited by breathing from desired activities    No obvious day to day or daytime variabilty or assoc cough or   cp or chest tightness, subjective wheeze overt sinus or hb symptoms. No unusual exp hx or h/o childhood pna/ asthma or knowledge of premature birth.  Sleeping ok without nocturnal  or early am exacerbation  of respiratory  c/o's or need for noct saba. Also denies any obvious fluctuation of symptoms with weather or environmental changes or other aggravating or alleviating factors except as outlined above   Current Medications, Allergies, Complete Past Medical History, Past Surgical History, Family History, and Social History were reviewed in [employer] record.  ROS  The following are not active complaints unless bolded sore throat, dysphagia, dental problems, itching, sneezing,  nasal congestion or excess/ purulent secretions, ear ache,   fever, chills, sweats, unintended wt loss, pleuritic or exertional cp, hemoptysis,  orthopnea pnd or leg swelling, presyncope, palpitations, heartburn, abdominal pain, anorexia, nausea, vomiting, diarrhea  or change in bowel or urinary habits, change in stools or urine, dysuria,hematuria,  rash, arthralgias, visual complaints, headache, numbness weakness or ataxia or problems with walking or coordination,  change in mood/affect or memory.                      Objective:   Physical Exam  amb wf nad   07/24/2014          200  > 11/01/2014  199 >  02/08/2015 198  Wt Readings from Last 3 Encounters:  06/20/14 199 lb (90.266 kg)  06/06/14 196 lb (88.905 kg)  06/06/14 196 lb (88.905 kg)    Vital signs reviewed  HEENT: nl dentition, turbinates, and orophanx. Nl external ear canals without cough reflex   NECK :  without JVD/Nodes/TM/ nl carotid upstrokes bilaterally   LUNGS: no acc muscle use,  clear to A and P bilaterally without cough on insp or exp maneuvers   CV:  RRR  no s3 or murmur or increase in P2, no edema   ABD:  soft and nontender with nl excursion in the supine position. No bruits or organomegaly, bowel sounds nl  MS:  warm without deformities, calf tenderness, cyanosis or clubbing  SKIN: warm and dry without lesions    NEURO:  alert, approp, no deficits     cxr 06/06/14 No evidence of acute cardiopulmonary disease             Assessment & Plan:

## 2015-02-09 ENCOUNTER — Encounter: Payer: Self-pay | Admitting: Internal Medicine

## 2015-02-09 NOTE — Assessment & Plan Note (Addendum)
-  07/24/2014   > trial of qvar 80 2bid - PFTs 11/01/2014 FEV1  2.36 (69%) ratio 74 p 33% improvement from saba/ dlco 80% -  11/01/2014 p extensive coaching HFA effectiveness =    75% > try symbicort 160 2bid > marked improvement   All goals of chronic asthma control met including optimal function and elimination of symptoms with minimal need for rescue therapy.  Contingencies discussed in full including contacting this office immediately if not controlling the symptoms using the rule of two's.     She is doing so much better now she is reluctant to consider step down for now even after hearing risk/benefit of attempting the  Lower dose ICS  I had an extended discussion with the patient reviewing all relevant studies completed to date and  lasting 15 to 20 minutes of a 25 minute visit    The proper method of use, as well as anticipated side effects, of a metered-dose inhaler are discussed and demonstrated to the patient. Improved effectiveness after extensive coaching during this visit to a level of approximately  90%   Each maintenance medication was reviewed in detail including most importantly the difference between maintenance and prns and under what circumstances the prns are to be triggered using an action plan format that is not reflected in the computer generated alphabetically organized AVS.    Please see instructions for details which were reviewed in writing and the patient given a copy highlighting the part that I personally wrote and discussed at today's ov.

## 2015-03-18 ENCOUNTER — Ambulatory Visit (INDEPENDENT_AMBULATORY_CARE_PROVIDER_SITE_OTHER): Payer: 59 | Admitting: Neurology

## 2015-03-18 ENCOUNTER — Encounter: Payer: Self-pay | Admitting: Neurology

## 2015-03-18 VITALS — BP 126/70 | HR 68 | Resp 20 | Ht 69.0 in | Wt 196.8 lb

## 2015-03-18 DIAGNOSIS — G40309 Generalized idiopathic epilepsy and epileptic syndromes, not intractable, without status epilepticus: Secondary | ICD-10-CM | POA: Diagnosis not present

## 2015-03-18 MED ORDER — LEVETIRACETAM 500 MG PO TABS: 1500.0000 mg | ORAL_TABLET | Freq: Two times a day (BID) | ORAL | Status: DC

## 2015-03-18 NOTE — Progress Notes (Signed)
NEUROLOGY FOLLOW UP OFFICE NOTE  Rebecca Silva [id number]  HISTORY OF PRESENT ILLNESS: Rebecca Silva is a 44 year old right-handed woman with history of Hashimoto's thyroiditis and asthma who follows up for primary generalized epilepsy with absence seizures.    UPDATE: She was started on Keppra 500mg  twice daily, which was subsequently increased to 1000mg  twice daily.  There has been improvement in frequency of spells.  She takes the morning dose at 10 am, while at work, but sometimes forgets to take it.  She takes the evening dose at 10 pm.  If she forgets a dose, or takes it too late, she can tell there is increased frequency of spells.  She also had an MRI of the brain performed on 12/13/14, which was personally reviewed and showed no evidence of acute infarct, mass lesion, hemorrhage or mesial temporal sclerosis.  HISTORY: She has had these episodes of eye flutter at least since adolescence.  Suddenly, both of her eyes will roll back for one or two seconds.  She is not aware that it is happening.  It occurs several times a day, every few minutes.  It seems more noticeable now.  There is a question about whether she loses awareness during an episode.  If you start a sentence when it occurs, she is not aware of what you said.  If she is speaking, she will stop and say "um" when it occurs.  She denies headache or focal abnormalities.  She denies any abnormal movements or gait abnormalities.  She says that her mom has it but less severe.  Her grandfather had epilepsy.   She had an ambulatory EEG, which showed brief intermittent bursts of generalized slowing with imbedded sharp waves, suggestive of primary generalized epilepsy.    PAST MEDICAL HISTORY: Past Medical History  Diagnosis Date  . Thyroid disease     hypothyroidism  . Asthma   . Allergy     seasonal    MEDICATIONS: Current Outpatient Prescriptions on File Prior to Visit  Medication Sig Dispense Refill  . albuterol  (VENTOLIN HFA) 108 (90 BASE) MCG/ACT inhaler Inhale 2 puffs into the lungs every 6 (six) hours as needed for wheezing.    . budesonide-formoterol (SYMBICORT) 160-4.5 MCG/ACT inhaler Take 2 puffs first thing in am and then another 2 puffs about 12 hours later. 1 Inhaler 11  . Cholecalciferol (VITAMIN D PO) Take 1 tablet by mouth daily.    Marland Kitchen levothyroxine (SYNTHROID, LEVOTHROID) 150 MCG tablet Take 1 tablet (150 mcg total) by mouth daily before breakfast. 30 tablet 11   No current facility-administered medications on file prior to visit.    ALLERGIES: No Known Allergies  FAMILY HISTORY: Family History  Problem Relation Age of Onset  . Heart disease Mother 80    open heart surgery  . Heart disease Maternal Grandmother   . Epilepsy Maternal Grandfather     SOCIAL HISTORY: Social History   Social History  . Marital Status: Married    Spouse Name: N/A  . Number of Children: N/A  . 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 Smoking status: Never Smoker   . Smokeless tobacco: Never Used  . Alcohol Use: No  . Drug Use: No  . Sexual Activity:    Partners: Male   Other Topics Concern  . 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  Children: 19 yrs and 15 yrs   Regular exercise: walking/running 2 x a wk   Caffeine use: coffee daily, sweet tea     REVIEW OF SYSTEMS: Constitutional: No fevers, chills, or sweats, no generalized fatigue, change in appetite Eyes: No visual changes, double vision, eye pain Ear, nose and throat: No hearing loss, ear pain, nasal congestion, sore throat Cardiovascular: No chest pain, palpitations Respiratory:  No shortness of breath at rest or with exertion, wheezes GastrointestinaI: No nausea, vomiting, diarrhea, abdominal pain, fecal incontinence Genitourinary:  No dysuria, urinary retention or frequency Musculoskeletal:  No neck pain, back pain Integumentary: No  rash, pruritus, skin lesions Neurological: as above Psychiatric: No depression, insomnia, anxiety Endocrine: No palpitations, fatigue, diaphoresis, mood swings, change in appetite, change in weight, increased thirst Hematologic/Lymphatic:  No anemia, purpura, petechiae. Allergic/Immunologic: no itchy/runny eyes, nasal congestion, recent allergic reactions, rashes  PHYSICAL EXAM: Filed Vitals:   03/18/15 1139  BP: 126/70  Pulse: 68  Resp: 20   General: No acute distress.  Patient appears well-groomed.   Head:  Normocephalic/atraumatic Eyes:  Fundoscopic exam unremarkable without vessel changes, exudates, hemorrhages or papilledema. Neck: supple, no paraspinal tenderness, full range of motion Heart:  Regular rate and rhythm Lungs:  Clear to auscultation bilaterally Back: No paraspinal tenderness Neurological Exam: alert and oriented to person, place, and time. Attention span and concentration intact, recent and remote memory intact, fund of knowledge intact.  Speech fluent and not dysarthric, language intact.  CN II-XII intact. Fundoscopic exam unremarkable without vessel changes, exudates, hemorrhages or papilledema.  Bulk and tone normal, muscle strength 5/5 throughout.  Sensation to light touch, temperature and vibration intact.  Deep tendon reflexes 2+ throughout, toes downgoing.  Finger to nose and heel to shin testing intact.  Gait normal, Romberg negative.  She exhibited one spell during the office visit.  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  She did display decreased frequency of spells during today's visit.  PLAN: Increase Keppra to 1500mg  twice daily.  She can take the morning dose when she gets up and ready for the day.   Follow up in 3 months or as needed.  15 minutes spent face to face with patient, over 50% spent discussing management.  Metta Clines, DO  CC:  Darlys Gales, DO

## 2015-03-18 NOTE — Patient Instructions (Signed)
1.  We will increase levetiracetam (Keppra) 500mg  tablets to three tablets (1500mg ) twice daily 2.  Follow up in 3 months but call sooner with questions or concerns.

## 2015-04-15 ENCOUNTER — Ambulatory Visit (INDEPENDENT_AMBULATORY_CARE_PROVIDER_SITE_OTHER): Payer: 59 | Admitting: Internal Medicine

## 2015-04-15 ENCOUNTER — Encounter: Payer: Self-pay | Admitting: Internal Medicine

## 2015-04-15 VITALS — BP 118/78 | HR 65 | Temp 98.1°F | Resp 14 | Ht 68.5 in | Wt 196.8 lb

## 2015-04-15 DIAGNOSIS — Z23 Encounter for immunization: Secondary | ICD-10-CM

## 2015-04-15 DIAGNOSIS — Z0000 Encounter for general adult medical examination without abnormal findings: Secondary | ICD-10-CM | POA: Diagnosis not present

## 2015-04-15 NOTE — Assessment & Plan Note (Signed)
No flare since last year and not needing albuterol inhaler. Seeing pulmonary.

## 2015-04-15 NOTE — Assessment & Plan Note (Signed)
Seeing endo and continue synthroid 150 mcg daily for now. Recent labs reviewed and appropriate.

## 2015-04-15 NOTE — Patient Instructions (Signed)
We will have you come back fasting for your bloodwork. The lab is located in the basement of the building where our office is. Take the elevator to B and the lab is on the left when you get out.  We will call you back with the results.   Come back in about 1 year for a check up and if you have any questions or problems before then please call the office.  Health Maintenance Adopting a healthy lifestyle and getting preventive care can go a long way to promote health and wellness. Talk with your health care provider about what schedule of regular examinations is right for you. This is a good chance for you to check in with your provider about disease prevention and staying healthy. In between checkups, there are plenty of things you can do on your own. Experts have done a lot of research about which lifestyle changes and preventive measures are most likely to keep you healthy. Ask your health care provider for more information. WEIGHT AND DIET  Eat a healthy diet  Be sure to include plenty of vegetables, fruits, low-fat dairy products, and lean protein.  Do not eat a lot of foods high in solid fats, added sugars, or salt.  Get regular exercise. This is one of the most important things you can do for your health.  Most adults should exercise for at least 150 minutes each week. The exercise should increase your heart rate and make you sweat (moderate-intensity exercise).  Most adults should also do strengthening exercises at least twice a week. This is in addition to the moderate-intensity exercise.  Maintain a healthy weight  Body mass index (BMI) is a measurement that can be used to identify possible weight problems. It estimates body fat based on height and weight. Your health care provider can help determine your BMI and help you achieve or maintain a healthy weight.  For females 55 years of age and older:   A BMI below 18.5 is considered underweight.  A BMI of 18.5 to 24.9 is  normal.  A BMI of 25 to 29.9 is considered overweight.  A BMI of 30 and above is considered obese.  Watch levels of cholesterol and blood lipids  You should start having your blood tested for lipids and cholesterol at 46 years of age, then have this test every 5 years.  You may need to have your cholesterol levels checked more often if:  Your lipid or cholesterol levels are high.  You are older than 46 years of age.  You are at high risk for heart disease.  CANCER SCREENING   Lung Cancer  Lung cancer screening is recommended for adults 22-46 years old who are at high risk for lung cancer because of a history of smoking.  A yearly low-dose CT scan of the lungs is recommended for people who:  Currently smoke.  Have quit within the past 15 years.  Have at least a 30-pack-year history of smoking. A pack year is smoking an average of one pack of cigarettes a day for 1 year.  Yearly screening should continue until it has been 15 years since you quit.  Yearly screening should stop if you develop a health problem that would prevent you from having lung cancer treatment.  Breast Cancer  Practice breast self-awareness. This means understanding how your breasts normally appear and feel.  It also means doing regular breast self-exams. Let your health care provider know about any changes, no matter how small.  If you are in your 20s or 30s, you should have a clinical breast exam (CBE) by a health care provider every 1-3 years as part of a regular health exam.  If you are 70 or older, have a CBE every year. Also consider having a breast X-ray (mammogram) every year.  If you have a family history of breast cancer, talk to your health care provider about genetic screening.  If you are at high risk for breast cancer, talk to your health care provider about having an MRI and a mammogram every year.  Breast cancer gene (BRCA) assessment is recommended for women who have family members  with BRCA-related cancers. BRCA-related cancers include:  Breast.  Ovarian.  Tubal.  Peritoneal cancers.  Results of the assessment will determine the need for genetic counseling and BRCA1 and BRCA2 testing. Cervical Cancer Routine pelvic examinations to screen for cervical cancer are no longer recommended for nonpregnant women who are considered low risk for cancer of the pelvic organs (ovaries, uterus, and vagina) and who do not have symptoms. A pelvic examination may be necessary if you have symptoms including those associated with pelvic infections. Ask your health care provider if a screening pelvic exam is right for you.   The Pap test is the screening test for cervical cancer for women who are considered at risk.  If you had a hysterectomy for a problem that was not cancer or a condition that could lead to cancer, then you no longer need Pap tests.  If you are older than 65 years, and you have had normal Pap tests for the past 10 years, you no longer need to have Pap tests.  If you have had past treatment for cervical cancer or a condition that could lead to cancer, you need Pap tests and screening for cancer for at least 20 years after your treatment.  If you no longer get a Pap test, assess your risk factors if they change (such as having a new sexual partner). This can affect whether you should start being screened again.  Some women have medical problems that increase their chance of getting cervical cancer. If this is the case for you, your health care provider may recommend more frequent screening and Pap tests.  The human papillomavirus (HPV) test is another test that may be used for cervical cancer screening. The HPV test looks for the virus that can cause cell changes in the cervix. The cells collected during the Pap test can be tested for HPV.  The HPV test can be used to screen women 34 years of age and older. Getting tested for HPV can extend the interval between normal  Pap tests from three to five years.  An HPV test also should be used to screen women of any age who have unclear Pap test results.  After 46 years of age, women should have HPV testing as often as Pap tests.  Colorectal Cancer  This type of cancer can be detected and often prevented.  Routine colorectal cancer screening usually begins at 46 years of age and continues through 46 years of age.  Your health care provider may recommend screening at an earlier age if you have risk factors for colon cancer.  Your health care provider may also recommend using home test kits to check for hidden blood in the stool.  A small camera at the end of a tube can be used to examine your colon directly (sigmoidoscopy or colonoscopy). This is done to check for  the earliest forms of colorectal cancer.  Routine screening usually begins at age 9.  Direct examination of the colon should be repeated every 5-10 years through 46 years of age. However, you may need to be screened more often if early forms of precancerous polyps or small growths are found. Skin Cancer  Check your skin from head to toe regularly.  Tell your health care provider about any new moles or changes in moles, especially if there is a change in a mole's shape or color.  Also tell your health care provider if you have a mole that is larger than the size of a pencil eraser.  Always use sunscreen. Apply sunscreen liberally and repeatedly throughout the day.  Protect yourself by wearing long sleeves, pants, a wide-brimmed hat, and sunglasses whenever you are outside. HEART DISEASE, DIABETES, AND HIGH BLOOD PRESSURE   Have your blood pressure checked at least every 1-2 years. High blood pressure causes heart disease and increases the risk of stroke.  If you are between 19 years and 30 years old, ask your health care provider if you should take aspirin to prevent strokes.  Have regular diabetes screenings. This involves taking a blood  sample to check your fasting blood sugar level.  If you are at a normal weight and have a low risk for diabetes, have this test once every three years after 46 years of age.  If you are overweight and have a high risk for diabetes, consider being tested at a younger age or more often. PREVENTING INFECTION  Hepatitis B  If you have a higher risk for hepatitis B, you should be screened for this virus. You are considered at high risk for hepatitis B if:  You were born in a country where hepatitis B is common. Ask your health care provider which countries are considered high risk.  Your parents were born in a high-risk country, and you have not been immunized against hepatitis B (hepatitis B vaccine).  You have HIV or AIDS.  You use needles to inject street drugs.  You live with someone who has hepatitis B.  You have had sex with someone who has hepatitis B.  You get hemodialysis treatment.  You take certain medicines for conditions, including cancer, organ transplantation, and autoimmune conditions. Hepatitis C  Blood testing is recommended for:  Everyone born from [birth year] through 1967.  Anyone with known risk factors for hepatitis C. Sexually transmitted infections (STIs)  You should be screened for sexually transmitted infections (STIs) including gonorrhea and chlamydia if:  You are sexually active and are younger than 46 years of age.  You are older than 46 years of age and your health care provider tells you that you are at risk for this type of infection.  Your sexual activity has changed since you were last screened and you are at an increased risk for chlamydia or gonorrhea. Ask your health care provider if you are at risk.  If you do not have HIV, but are at risk, it may be recommended that you take a prescription medicine daily to prevent HIV infection. This is called pre-exposure prophylaxis (PrEP). You are considered at risk if:  You are sexually active and do not  regularly use condoms or know the HIV status of your partner(s).  You take drugs by injection.  You are sexually active with a partner who has HIV. Talk with your health care provider about whether you are at high risk of being infected with HIV. If you  choose to begin PrEP, you should first be tested for HIV. You should then be tested every 3 months for as long as you are taking PrEP.  PREGNANCY   If you are premenopausal and you may become pregnant, ask your health care provider about preconception counseling.  If you may become pregnant, take 400 to 800 micrograms (mcg) of folic acid every day.  If you want to prevent pregnancy, talk to your health care provider about birth control (contraception). OSTEOPOROSIS AND MENOPAUSE   Osteoporosis is a disease in which the bones lose minerals and strength with aging. This can result in serious bone fractures. Your risk for osteoporosis can be identified using a bone density scan.  If you are 101 years of age or older, or if you are at risk for osteoporosis and fractures, ask your health care provider if you should be screened.  Ask your health care provider whether you should take a calcium or vitamin D supplement to lower your risk for osteoporosis.  Menopause may have certain physical symptoms and risks.  Hormone replacement therapy may reduce some of these symptoms and risks. Talk to your health care provider about whether hormone replacement therapy is right for you.  HOME CARE INSTRUCTIONS   Schedule regular health, dental, and eye exams.  Stay current with your immunizations.   Do not use any tobacco products including cigarettes, chewing tobacco, or electronic cigarettes.  If you are pregnant, do not drink alcohol.  If you are breastfeeding, limit how much and how often you drink alcohol.  Limit alcohol intake to no more than 1 drink per day for nonpregnant women. One drink equals 12 ounces of beer, 5 ounces of wine, or 1  ounces of hard liquor.  Do not use street drugs.  Do not share needles.  Ask your health care provider for help if you need support or information about quitting drugs.  Tell your health care provider if you often feel depressed.  Tell your health care provider if you have ever been abused or do not feel safe at home. Document Released: 01/19/2011 Document Revised: 11/20/2013 Document Reviewed: 06/07/2013 [facility] Patient Information 2015 [place], [place]. This information is not intended to replace advice given to you by your health care provider. Make sure you discuss any questions you have with your health care provider.

## 2015-04-15 NOTE — Progress Notes (Signed)
   Subjective:    Patient ID: Rebecca Silva, female    DOB: [birth year], 44 y.o.   MRN: [id number]  HPI The patient is a new 44 YO female coming in for wellness. No new concerns. Follows with endo for her thyroid and neurology for her seizure disorder.   PMH, [facility], social history reviewed and updated.   Review of Systems  Constitutional: Negative for fever, activity change, appetite change, fatigue and unexpected weight change.  HENT: Negative.   Eyes: Negative.   Respiratory: Negative for cough, chest tightness, shortness of breath and wheezing.   Cardiovascular: Negative for chest pain, palpitations and leg swelling.  Gastrointestinal: Negative for nausea, abdominal pain, diarrhea, constipation, blood in stool and abdominal distention.  Musculoskeletal: Negative.   Skin: Negative.   Neurological: Positive for seizures. Negative for dizziness, facial asymmetry and numbness.       Better recently, seeing neurology  Psychiatric/Behavioral: Negative.       Objective:   Physical Exam  Constitutional: She is oriented to person, place, and time. She appears well-developed and well-nourished.  HENT:  Head: Normocephalic and atraumatic.  Nose with white crusting  Eyes: EOM are normal.  Neck: Normal range of motion.  Cardiovascular: Normal rate and regular rhythm.   No murmur heard. Pulmonary/Chest: Effort normal and breath sounds normal. No respiratory distress. She has no wheezes.  Abdominal: Soft. Bowel sounds are normal. She exhibits no distension. There is no tenderness. There is no rebound.  Musculoskeletal: She exhibits no edema.  Neurological: She is alert and oriented to person, place, and time. Coordination normal.  Skin: Skin is warm and dry.  Psychiatric: She has a normal mood and affect.   Filed Vitals:   04/15/15 1100  BP: 118/78  Pulse: 65  Temp: 98.1 F (36.7 C)  TempSrc: Oral  Resp: 14  Height: 5' 8.5" (1.74 m)  Weight: 196 lb 12.8 oz (89.268 kg)  SpO2: 98%        Assessment & Plan:  Flu shot given at visit.

## 2015-04-15 NOTE — Progress Notes (Signed)
Pre visit review using our clinic review tool, if applicable. No additional management support is needed unless otherwise documented below in the visit note. 

## 2015-04-15 NOTE — Assessment & Plan Note (Signed)
Checking labs today (prior sugar on lab 125), talked to her about diet and exercise as a part of a healthy lifestyle. Flu shot given today. She is not sure of last tetanus and does not want repeat today. No early family history of colon cancer to warrant early screening. Up to date on pap smear (sees gynecology) and mammogram.

## 2015-04-15 NOTE — Assessment & Plan Note (Signed)
Doing well on increase dosing of keppra without side effects. Seeing neurology.

## 2015-04-19 ENCOUNTER — Telehealth: Payer: Self-pay | Admitting: Internal Medicine

## 2015-04-19 NOTE — Telephone Encounter (Signed)
Rec'd from [facility] forward 52 pages to Dr.Kollar

## 2015-05-24 ENCOUNTER — Encounter: Payer: Self-pay | Admitting: Endocrinology

## 2015-05-24 ENCOUNTER — Ambulatory Visit (INDEPENDENT_AMBULATORY_CARE_PROVIDER_SITE_OTHER): Payer: 59 | Admitting: Endocrinology

## 2015-05-24 VITALS — BP 130/82 | HR 83 | Temp 98.0°F | Ht 68.5 in | Wt 197.0 lb

## 2015-05-24 DIAGNOSIS — E559 Vitamin D deficiency, unspecified: Secondary | ICD-10-CM | POA: Diagnosis not present

## 2015-05-24 DIAGNOSIS — E038 Other specified hypothyroidism: Secondary | ICD-10-CM

## 2015-05-24 LAB — TSH: TSH: 2.56 u[IU]/mL (ref 0.35–4.50)

## 2015-05-24 LAB — VITAMIN D 25 HYDROXY (VIT D DEFICIENCY, FRACTURES): VITD: 20.91 ng/mL — AB (ref 30.00–100.00)

## 2015-05-24 MED ORDER — LEVOTHYROXINE SODIUM 150 MCG PO TABS: 150.0000 ug | ORAL_TABLET | Freq: Every day | ORAL | Status: DC

## 2015-05-24 NOTE — Patient Instructions (Addendum)
blood tests are requested for you today.  We'll let you know about the results. I would be happy to see you back here as necessary.   

## 2015-05-24 NOTE — Progress Notes (Signed)
Subjective:    Patient ID: Rebecca Silva, female    DOB: [birth year], 44 y.o.   MRN: [id number]  HPI  The state of at least three ongoing medical problems is addressed today, with interval history of each noted here: Pt returns for f/u of primary hypothyroidism (dx'ed 2003; she has been on thyroid hormone supplement since then; she has been on the current dosage of 150 mcg/day, since late 2015; this is the highest dosage she has ever taken; [place] in 2013 showed heterogeneous tissue, but no nodule).  She takes synthroid as rx'ed.  She does not notice any thyroid enlargement. Hypovitaminosis-D: pt states she feels well in general, except she still has leg cramps.   Petit mal seizures: are much less frequent recently Past Medical History  Diagnosis Date  . Thyroid disease     hypothyroidism  . Asthma   . Allergy     seasonal  . Heart murmur   . Urinary tract infection   . Urine incontinence   . Seizure disorder ([place])     No past surgical history on file.  Social History   Social History  . Marital Status: Married    Spouse Name: N/A  . Number of Children: N/A  . 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 Smoking status: Never Smoker   . Smokeless tobacco: Never Used  . Alcohol Use: No  . Drug Use: No  . Sexual Activity:    Partners: Male   Other Topics Concern  . 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 albuterol (VENTOLIN HFA) 108 (90 BASE) MCG/ACT inhaler Inhale 2 puffs into the lungs every 6 (six) hours as needed for wheezing.    . budesonide-formoterol (SYMBICORT) 160-4.5 MCG/ACT inhaler Take 2 puffs first thing in am and then another 2 puffs about 12 hours later. 1 Inhaler  11  . Cholecalciferol (VITAMIN D PO) Take 1 tablet by mouth daily.    Marland Kitchen levETIRAcetam (KEPPRA) 500 MG tablet Take 3 tablets (1,500 mg total) by mouth 2 (two) times daily. 1000 mg twice daily 240 tablet 3   No current facility-administered medications on file prior to visit.    No Known Allergies  Family History  Problem Relation Age of Onset  . Heart disease Mother 75    open heart surgery  . Heart disease Maternal Grandmother   . Epilepsy Maternal Grandfather     BP 130/82 mmHg  Pulse 83  Temp(Src) 98 F (36.7 C) (Oral)  Ht 5' 8.5" (1.74 m)  Wt 197 lb (89.359 kg)  BMI 29.51 kg/m2  SpO2 97%   Review of Systems No change in chronically dry skin.  No weight change    Objective:   Physical Exam VITAL SIGNS:  See vs page GENERAL: no distress NECK: There is no palpable thyroid enlargement.  No thyroid nodule is palpable.  No palpable lymphadenopathy at the anterior neck.    Lab Results  Component Value Date   TSH 4.17 01/23/2015  vit-D is low    Assessment & Plan:  Hypothyroidism: well-replaced Hypovitaminosis-D, recurrent Epilepsy: I am uncertain if vitamin-D supplement is  helping, but we'll make sure just in case.    Patient is advised the following: Patient Instructions  blood tests are requested for you today.  We'll let you know about the results. I would be happy to see you back here as necessary.

## 2015-05-26 ENCOUNTER — Other Ambulatory Visit: Payer: Self-pay | Admitting: Endocrinology

## 2015-05-26 MED ORDER — VITAMIN D (ERGOCALCIFEROL) 1.25 MG (50000 UNIT) PO CAPS
50000.0000 [IU] | ORAL_CAPSULE | ORAL | Status: DC
Start: 2015-05-26 — End: 2015-12-31

## 2015-06-24 ENCOUNTER — Ambulatory Visit (INDEPENDENT_AMBULATORY_CARE_PROVIDER_SITE_OTHER): Payer: 59 | Admitting: Neurology

## 2015-06-24 ENCOUNTER — Encounter: Payer: Self-pay | Admitting: Neurology

## 2015-06-24 VITALS — BP 126/70 | HR 79 | Ht 68.5 in | Wt 196.0 lb

## 2015-06-24 DIAGNOSIS — G40309 Generalized idiopathic epilepsy and epileptic syndromes, not intractable, without status epilepticus: Secondary | ICD-10-CM

## 2015-06-24 NOTE — Progress Notes (Signed)
NEUROLOGY FOLLOW UP OFFICE NOTE  Rebecca Silva [id number]  HISTORY OF PRESENT ILLNESS: Rebecca Silva is a 44 year old right-handed woman with history of Hashimoto's thyroiditis and asthma who follows up for primary generalized epilepsy with absence seizures.    UPDATE: She takes Keppra 1500mg  twice daily.  She is doing well.  Her family has not noticed any recurrent spells.  She says that her 66 year old son also has the same blinking spells for about the past 5 years.  HISTORY: She has had these episodes of eye flutter at least since adolescence.  Suddenly, both of her eyes will roll back for one or two seconds.  She is not aware that it is happening.  It occurs several times a day, every few minutes.  It seems more noticeable now.  There is a question about whether she loses awareness during an episode.  If you start a sentence when it occurs, she is not aware of what you said.  If she is speaking, she will stop and say "um" when it occurs.  She denies headache or focal abnormalities.  She denies any abnormal movements or gait abnormalities.  She says that her mom has it but less severe.  Her grandfather had epilepsy.   She had an ambulatory EEG, which showed brief intermittent bursts of generalized slowing with imbedded sharp waves, suggestive of primary generalized epilepsy.   She also had an MRI of the brain performed on 12/13/14, which was personally reviewed and showed no evidence of acute infarct, mass lesion, hemorrhage or mesial temporal sclerosis.  PAST MEDICAL HISTORY: Past Medical History  Diagnosis Date  . Thyroid disease     hypothyroidism  . Asthma   . Allergy     seasonal  . Heart murmur   . Urinary tract infection   . Urine incontinence   . Seizure disorder [facility])     MEDICATIONS: Current Outpatient Prescriptions on File Prior to Visit  Medication Sig Dispense Refill  . albuterol (VENTOLIN HFA) 108 (90 BASE) MCG/ACT inhaler Inhale 2 puffs into the lungs every  6 (six) hours as needed for wheezing.    . budesonide-formoterol (SYMBICORT) 160-4.5 MCG/ACT inhaler Take 2 puffs first thing in am and then another 2 puffs about 12 hours later. 1 Inhaler 11  . levETIRAcetam (KEPPRA) 500 MG tablet Take 3 tablets (1,500 mg total) by mouth 2 (two) times daily. 1000 mg twice daily 240 tablet 3  . levothyroxine (SYNTHROID, LEVOTHROID) 150 MCG tablet Take 1 tablet (150 mcg total) by mouth daily before breakfast. 90 tablet 3  . Vitamin D, Ergocalciferol, (DRISDOL) 50000 UNITS CAPS capsule Take 1 capsule (50,000 Units total) by mouth every 7 (seven) days. 8 capsule 0  . Cholecalciferol (VITAMIN D PO) Take 1 tablet by mouth daily.     No current facility-administered medications on file prior to visit.    ALLERGIES: No Known Allergies  FAMILY HISTORY: Family History  Problem Relation Age of Onset  . Heart disease Mother 32    open heart surgery  . Heart disease Maternal Grandmother   . Epilepsy Maternal Grandfather     SOCIAL HISTORY: Social History   Social History  . Marital Status: Married    Spouse Name: N/A  . Number of Children: N/A  . 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 Smoking status: Never Smoker   . Smokeless tobacco: Never Used  . Alcohol  Use: No  . Drug Use: No  . Sexual Activity:    Partners: Male   Other Topics Concern  . 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   Children: 19 yrs and 15 yrs   Regular exercise: walking/running 2 x a wk   Caffeine use: coffee daily, sweet tea     REVIEW OF SYSTEMS: Constitutional: No fevers, chills, or sweats, no generalized fatigue, change in appetite Eyes: No visual changes, double vision, eye pain Ear, nose and throat: No hearing loss, ear pain, nasal congestion, sore throat Cardiovascular: No chest pain, palpitations Respiratory:  No shortness of breath at rest or with exertion,  wheezes GastrointestinaI: No nausea, vomiting, diarrhea, abdominal pain, fecal incontinence Genitourinary:  No dysuria, urinary retention or frequency Musculoskeletal:  No neck pain, back pain Integumentary: No rash, pruritus, skin lesions Neurological: as above Psychiatric: No depression, insomnia, anxiety Endocrine: No palpitations, fatigue, diaphoresis, mood swings, change in appetite, change in weight, increased thirst Hematologic/Lymphatic:  No anemia, purpura, petechiae. Allergic/Immunologic: no itchy/runny eyes, nasal congestion, recent allergic reactions, rashes  PHYSICAL EXAM: Filed Vitals:   06/24/15 1424  BP: 126/70  Pulse: 79   General: No acute distress.  Patient appears well-groomed.  Head:  Normocephalic/atraumatic Eyes:  Fundoscopic exam unremarkable without vessel changes, exudates, hemorrhages or papilledema. Neck: supple, no paraspinal tenderness, full range of motion Heart:  Regular rate and rhythm Lungs:  Clear to auscultation bilaterally Back: No paraspinal tenderness Neurological Exam: alert and oriented to person, place, and time. Attention span and concentration intact, recent and remote memory intact, fund of knowledge intact.  Speech fluent and not dysarthric, language intact.  CN II-XII intact. Fundoscopic exam unremarkable without vessel changes, exudates, hemorrhages or papilledema.  Bulk and tone normal, muscle strength 5/5 throughout.  Sensation to light touch, temperature and vibration intact.  Deep tendon reflexes 2+ throughout.  Finger to nose and heel to shin testing intact.  Gait normal, Romberg negative.  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   PLAN: 1.  Continue Keppra 1500mg  twice daily 2.  Recommended that her son follow up with me or perhaps see a neurologist for workup down where he goes to college. 3.  Follow up in 6 months.  15 minutes spent face to face with patient, over 50% spent discussing  management.  Metta Clines, DO  CC:  Pricilla Holm, MD

## 2015-06-24 NOTE — Patient Instructions (Signed)
Continue Keppra 1500 mg twice daily Followup in 6 months 

## 2015-06-27 ENCOUNTER — Telehealth: Payer: Self-pay | Admitting: *Deleted

## 2015-06-27 NOTE — Telephone Encounter (Signed)
A user error has taken place: open by mistake need to be for husband.../lmb.

## 2015-07-25 ENCOUNTER — Telehealth: Payer: Self-pay | Admitting: Internal Medicine

## 2015-07-25 NOTE — Telephone Encounter (Signed)
Spoke with pt. A [employer] savings card has been left at the front for pick up. Nothing further was needed.

## 2015-08-01 ENCOUNTER — Ambulatory Visit (INDEPENDENT_AMBULATORY_CARE_PROVIDER_SITE_OTHER): Payer: 59 | Admitting: Internal Medicine

## 2015-08-01 VITALS — BP 126/74 | HR 85 | Temp 98.3°F | Ht 68.0 in | Wt 194.0 lb

## 2015-08-01 DIAGNOSIS — R05 Cough: Secondary | ICD-10-CM | POA: Diagnosis not present

## 2015-08-01 DIAGNOSIS — E559 Vitamin D deficiency, unspecified: Secondary | ICD-10-CM | POA: Diagnosis not present

## 2015-08-01 DIAGNOSIS — R062 Wheezing: Secondary | ICD-10-CM

## 2015-08-01 DIAGNOSIS — R059 Cough, unspecified: Secondary | ICD-10-CM | POA: Insufficient documentation

## 2015-08-01 MED ORDER — AZITHROMYCIN 250 MG PO TABS: ORAL_TABLET | ORAL | Status: DC

## 2015-08-01 MED ORDER — PREDNISONE 10 MG PO TABS: ORAL_TABLET | ORAL | Status: DC

## 2015-08-01 MED ORDER — HYDROCODONE-HOMATROPINE 5-1.5 MG/5ML PO SYRP: 5.0000 mL | ORAL_SOLUTION | Freq: Four times a day (QID) | ORAL | Status: DC | PRN

## 2015-08-01 NOTE — Progress Notes (Signed)
Pre visit review using our clinic review tool, if applicable. No additional management support is needed unless otherwise documented below in the visit note. 

## 2015-08-01 NOTE — Patient Instructions (Signed)
Please take all new medication as prescribed - the antibiotic, cough medicine, and prednisone  Please continue all other medications as before, including the inhaler  Please have the pharmacy call with any other refills you may need.  Please keep your appointments with your specialists as you may have planned  The Vit D level was added to your labs at your request

## 2015-08-01 NOTE — Progress Notes (Signed)
Subjective:    Patient ID: Rebecca Silva, female    DOB: [birth year], 45 y.o.   MRN: [id number]  HPI  Here with acute onset mild to mod 2-3 days ST, HA, general weakness and malaise, with prod cough greenish sputum, but Pt denies chest pain, increased sob or doe, wheezing, orthopnea, PND, increased LE swelling, palpitations, dizziness or syncope, except for onset mild wheezing and sob last PM.  Pt denies new neurological symptoms such as new headache, or facial or extremity weakness or numbness except for the above, No recent Sz.  Does ask for Vit D lab f/u after recent Vit D higher dose tx.  No hx of osteopoosis. Past Medical History  Diagnosis Date  . Thyroid disease     hypothyroidism  . Asthma   . Allergy     seasonal  . Heart murmur   . Urinary tract infection   . Urine incontinence   . Seizure disorder ([place])    No past surgical history on file.  reports that she has never smoked. She has never used smokeless tobacco. She reports that she does not drink alcohol or use illicit drugs. family history includes Epilepsy in her maternal grandfather; Heart disease in her maternal grandmother; Heart disease (age of onset: 41) in her mother. No Known Allergies Current Outpatient Prescriptions on File Prior to Visit  Medication Sig Dispense Refill  . albuterol (VENTOLIN HFA) 108 (90 BASE) MCG/ACT inhaler Inhale 2 puffs into the lungs every 6 (six) hours as needed for wheezing.    . budesonide-formoterol (SYMBICORT) 160-4.5 MCG/ACT inhaler Take 2 puffs first thing in am and then another 2 puffs about 12 hours later. 1 Inhaler 11  . levETIRAcetam (KEPPRA) 500 MG tablet Take 3 tablets (1,500 mg total) by mouth 2 (two) times daily. 1000 mg twice daily 240 tablet 3  . levothyroxine (SYNTHROID, LEVOTHROID) 150 MCG tablet Take 1 tablet (150 mcg total) by mouth daily before breakfast. 90 tablet 3  . Cholecalciferol (VITAMIN D PO) Take 1 tablet by mouth daily. Reported on 08/01/2015    . Vitamin D,  Ergocalciferol, (DRISDOL) 50000 UNITS CAPS capsule Take 1 capsule (50,000 Units total) by mouth every 7 (seven) days. (Patient not taking: Reported on 08/01/2015) 8 capsule 0   No current facility-administered medications on file prior to visit.   Review of Systems  Constitutional: Negative for unusual diaphoresis or night sweats HENT: Negative for ringing in ear or discharge Eyes: Negative for double vision or worsening visual disturbance.  Respiratory: Negative for choking and stridor.   Gastrointestinal: Negative for vomiting or other signifcant bowel change Genitourinary: Negative for hematuria or change in urine volume.  Musculoskeletal: Negative for other MSK pain or swelling Skin: Negative for color change and worsening wound.  Neurological: Negative for tremors and numbness other than noted  Psychiatric/Behavioral: Negative for decreased concentration or agitation other than above       Objective:   Physical Exam BP 126/74 mmHg  Pulse 85  Temp(Src) 98.3 F (36.8 C) (Oral)  Ht 5\' 8"  (1.727 m)  Wt 194 lb (87.998 kg)  BMI 29.50 kg/m2  SpO2 97%. VS noted, mild ill Constitutional: Pt appears in no significant distress HENT: Head: NCAT.  Right Ear: External ear normal.  Left Ear: External ear normal.  Bilat tm's with mild erythema.  Max sinus areas mild tender.  Pharynx with mild erythema, no exudate Eyes: . Pupils are equal, round, and reactive to light. Conjunctivae and EOM are normal Neck: Normal range  of motion. Neck supple.  Cardiovascular: Normal rate and regular rhythm.   Pulmonary/Chest: Effort normal and breath sounds without rales but few bilat scattered wheezing.  Neurological: Pt is alert. Not confused , motor grossly intact Skin: Skin is warm. No rash, no LE edema Psychiatric: Pt behavior is normal. No agitation.     Assessment & Plan:

## 2015-08-02 ENCOUNTER — Ambulatory Visit: Payer: 59 | Admitting: Internal Medicine

## 2015-08-03 NOTE — Assessment & Plan Note (Signed)
Mild, for predpac asd,  to f/u any worsening symptoms or concerns 

## 2015-08-03 NOTE — Assessment & Plan Note (Signed)
Ok for lab ordered as per pt request, ok to start OTC Vit D3 1000 units per day

## 2015-08-03 NOTE — Assessment & Plan Note (Signed)
Mild to mod, c/w bronchitis vs pna, declines cxr, for antibx course, cough med prn,  to f/u any worsening symptoms or concerns 

## 2015-08-19 ENCOUNTER — Other Ambulatory Visit (INDEPENDENT_AMBULATORY_CARE_PROVIDER_SITE_OTHER): Payer: 59

## 2015-08-19 ENCOUNTER — Other Ambulatory Visit: Payer: Self-pay | Admitting: Internal Medicine

## 2015-08-19 DIAGNOSIS — E559 Vitamin D deficiency, unspecified: Secondary | ICD-10-CM | POA: Diagnosis not present

## 2015-08-19 DIAGNOSIS — Z0000 Encounter for general adult medical examination without abnormal findings: Secondary | ICD-10-CM | POA: Diagnosis not present

## 2015-08-19 LAB — LIPID PANEL
CHOL/HDL RATIO: 5
Cholesterol: 180 mg/dL (ref 0–200)
HDL: 35.3 mg/dL — ABNORMAL LOW (ref >39.00)
LDL Cholesterol: 129 mg/dL — ABNORMAL HIGH (ref 0–99)
NonHDL: 144.48
Triglycerides: 79 mg/dL (ref 0.0–149.0)
VLDL: 15.8 mg/dL (ref 0.0–40.0)

## 2015-08-19 LAB — COMPREHENSIVE METABOLIC PANEL
ALT: 10 U/L (ref 0–35)
AST: 12 U/L (ref 0–37)
Albumin: 4.1 g/dL (ref 3.5–5.2)
Alkaline Phosphatase: 52 U/L (ref 39–117)
BUN: 11 mg/dL (ref 6–23)
CHLORIDE: 107 meq/L (ref 96–112)
CO2: 26 meq/L (ref 19–32)
CREATININE: 0.78 mg/dL (ref 0.40–1.20)
Calcium: 8.9 mg/dL (ref 8.4–10.5)
GFR: 84.16 mL/min (ref >60.00)
Glucose, Bld: 92 mg/dL (ref 70–99)
Potassium: 4.3 mEq/L (ref 3.5–5.1)
Sodium: 140 mEq/L (ref 135–145)
Total Bilirubin: 0.5 mg/dL (ref 0.2–1.2)
Total Protein: 7 g/dL (ref 6.0–8.3)

## 2015-08-19 LAB — VITAMIN D 25 HYDROXY (VIT D DEFICIENCY, FRACTURES): VITD: 19.75 ng/mL — ABNORMAL LOW (ref 30.00–100.00)

## 2015-08-19 LAB — HEMOGLOBIN A1C: Hgb A1c MFr Bld: 5.5 % (ref 4.6–6.5)

## 2015-08-22 ENCOUNTER — Encounter: Payer: Self-pay | Admitting: Neurology

## 2015-10-08 ENCOUNTER — Other Ambulatory Visit: Payer: Self-pay | Admitting: *Deleted

## 2015-10-08 MED ORDER — ALBUTEROL SULFATE (2.5 MG/3ML) 0.083% IN NEBU: 2.5000 mg | INHALATION_SOLUTION | Freq: Four times a day (QID) | RESPIRATORY_TRACT | Status: DC | PRN

## 2015-10-08 MED ORDER — ALBUTEROL SULFATE HFA 108 (90 BASE) MCG/ACT IN AERS: 2.0000 | INHALATION_SPRAY | Freq: Four times a day (QID) | RESPIRATORY_TRACT | Status: DC | PRN

## 2015-10-14 ENCOUNTER — Telehealth: Payer: Self-pay | Admitting: Endocrinology

## 2015-10-14 MED ORDER — LEVOTHYROXINE SODIUM 150 MCG PO TABS: 150.0000 ug | ORAL_TABLET | Freq: Every day | ORAL | Status: DC

## 2015-10-14 NOTE — Telephone Encounter (Signed)
Rx sent per pt's request.  

## 2015-10-14 NOTE — Telephone Encounter (Signed)
Pt requests refills for her Synthroid be sent into her pharmacy

## 2015-10-30 ENCOUNTER — Telehealth: Payer: Self-pay | Admitting: Neurology

## 2015-10-30 NOTE — Telephone Encounter (Signed)
Rebecca Silva Feb 26, 2071 called requesting a refill on her medication. She said the pharmacy had sent a refill request in last Thursday or Friday. Her call back number is [phone]. Thank you

## 2015-10-31 MED ORDER — LEVETIRACETAM 750 MG PO TABS: 1500.0000 mg | ORAL_TABLET | Freq: Two times a day (BID) | ORAL | Status: DC

## 2015-10-31 NOTE — Telephone Encounter (Signed)
No refill request was ever received. Will refill now however.   Last OV: 06/24/15 Next OV: 12/26/15  . Continue Keppra 1500mg  twice daily

## 2015-12-27 ENCOUNTER — Encounter: Payer: Self-pay | Admitting: Neurology

## 2015-12-27 ENCOUNTER — Ambulatory Visit (INDEPENDENT_AMBULATORY_CARE_PROVIDER_SITE_OTHER): Payer: 59 | Admitting: Neurology

## 2015-12-27 VITALS — BP 108/78 | HR 65 | Ht 68.0 in | Wt 195.5 lb

## 2015-12-27 DIAGNOSIS — G40309 Generalized idiopathic epilepsy and epileptic syndromes, not intractable, without status epilepticus: Secondary | ICD-10-CM | POA: Diagnosis not present

## 2015-12-27 NOTE — Progress Notes (Signed)
NEUROLOGY FOLLOW UP OFFICE NOTE  [name] [id number]  HISTORY OF PRESENT ILLNESS: Rebecca Silva is a 45 year old right-handed woman with history of Hashimoto's thyroiditis and asthma who follows up for primary generalized epilepsy with absence seizures.    UPDATE: She takes Keppra 1500mg  twice daily.  She is doing well.  Her family has not noticed any recurrent spells.   HISTORY: She has had these episodes of eye flutter at least since adolescence.  Suddenly, both of her eyes will roll back for one or two seconds.  She is not aware that it is happening.  It occurs several times a day, every few minutes.  It seems more noticeable now.  There is a question about whether she loses awareness during an episode.  If you start a sentence when it occurs, she is not aware of what you said.  If she is speaking, she will stop and say "um" when it occurs.  She denies headache or focal abnormalities.  She denies any abnormal movements or gait abnormalities.  She says that her mom has it but less severe.  Her grandfather had epilepsy.   She had an ambulatory EEG, which showed brief intermittent bursts of generalized slowing with imbedded sharp waves, suggestive of primary generalized epilepsy.   She also had an MRI of the brain performed on 12/13/14, which was personally reviewed and showed no evidence of acute infarct, mass lesion, hemorrhage or mesial temporal sclerosis.  PAST MEDICAL HISTORY: Past Medical History  Diagnosis Date  . Thyroid disease     hypothyroidism  . Asthma   . Allergy     seasonal  . Heart murmur   . Urinary tract infection   . Urine incontinence   . Seizure disorder [facility])     MEDICATIONS: Current Outpatient Prescriptions on File Prior to Visit  Medication Sig Dispense Refill  . albuterol (PROVENTIL) (2.5 MG/3ML) 0.083% nebulizer solution Take 3 mLs (2.5 mg total) by nebulization every 6 (six) hours as needed for wheezing or shortness of breath. 75 mL 0  .  albuterol (VENTOLIN HFA) 108 (90 Base) MCG/ACT inhaler Inhale 2 puffs into the lungs every 6 (six) hours as needed for wheezing. 18 g 1  . azithromycin (ZITHROMAX Z-PAK) 250 MG tablet Use as directed 6 tablet 1  . budesonide-formoterol (SYMBICORT) 160-4.5 MCG/ACT inhaler Take 2 puffs first thing in am and then another 2 puffs about 12 hours later. 1 Inhaler 11  . Cholecalciferol (VITAMIN D PO) Take 1 tablet by mouth daily. Reported on 08/01/2015    . HYDROcodone-homatropine (HYCODAN) 5-1.5 MG/5ML syrup Take 5 mLs by mouth every 6 (six) hours as needed for cough. 180 mL 0  . levETIRAcetam (KEPPRA) 750 MG tablet Take 2 tablets (1,500 mg total) by mouth 2 (two) times daily. 1000 mg twice daily 120 tablet 2  . levothyroxine (SYNTHROID, LEVOTHROID) 150 MCG tablet Take 1 tablet (150 mcg total) by mouth daily before breakfast. 90 tablet 1  . predniSONE (DELTASONE) 10 MG tablet 3 tabs by mouth per day for 3 days,2tabs per day for 3 days,1tab per day for 3 days 18 tablet 0  . Vitamin D, Ergocalciferol, (DRISDOL) 50000 UNITS CAPS capsule Take 1 capsule (50,000 Units total) by mouth every 7 (seven) days. (Patient not taking: Reported on 08/01/2015) 8 capsule 0   No current facility-administered medications on file prior to visit.    ALLERGIES: No Known Allergies  FAMILY HISTORY: Family History  Problem Relation Age of Onset  .  Heart disease Mother 52    open heart surgery  . Heart disease Maternal Grandmother   . Epilepsy Maternal Grandfather     SOCIAL HISTORY: Social History   Social History  . Marital Status: Married    Spouse Name: N/A  . Number of Children: N/A  . 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 Smoking status: Never Smoker   . Smokeless tobacco: Never Used  . Alcohol Use: No  . Drug Use: No  . Sexual Activity:    Partners: Male   Other Topics Concern  . 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   Children: 19 yrs and 15 yrs   Regular exercise: walking/running 2 x a wk   Caffeine use: coffee daily, sweet tea     REVIEW OF SYSTEMS: Constitutional: No fevers, chills, or sweats, no generalized fatigue, change in appetite Eyes: No visual changes, double vision, eye pain Ear, nose and throat: No hearing loss, ear pain, nasal congestion, sore throat Cardiovascular: No chest pain, palpitations Respiratory:  No shortness of breath at rest or with exertion, wheezes GastrointestinaI: No nausea, vomiting, diarrhea, abdominal pain, fecal incontinence Genitourinary:  No dysuria, urinary retention or frequency Musculoskeletal:  No neck pain, back pain Integumentary: No rash, pruritus, skin lesions Neurological: as above Psychiatric: No depression, insomnia, anxiety Endocrine: No palpitations, fatigue, diaphoresis, mood swings, change in appetite, change in weight, increased thirst Hematologic/Lymphatic:  No purpura, petechiae. Allergic/Immunologic: no itchy/runny eyes, nasal congestion, recent allergic reactions, rashes  PHYSICAL EXAM: Filed Vitals:   12/27/15 1402  BP: 108/78  Pulse: 65   General: No acute distress.  Patient appears well-groomed.  normal body habitus. Head:  Normocephalic/atraumatic Eyes:  Fundi examined but not visualized Neck: supple, no paraspinal tenderness, full range of motion Heart:  Regular rate and rhythm Lungs:  Clear to auscultation bilaterally Back: No paraspinal tenderness Neurological Exam: alert and oriented to person, place, and time. Attention span and concentration intact, recent and remote memory intact, fund of knowledge intact.  Speech fluent and not dysarthric, language intact.  CN II-XII intact. Bulk and tone normal, muscle strength 5/5 throughout.  Sensation to light touch, temperature and vibration intact.  Deep tendon reflexes 2+ throughout, toes downgoing.  Finger to nose and heel to shin testing intact.  Gait normal, Romberg  negative.  No spells noted during visit.  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   PLAN: Keppra 1500mg  twice daily Follow up in one year or as needed.  15 minutes spent face to face with patient, over 50% spent discussing management.  Metta Clines, DO  CC:  Pricilla Holm, MD

## 2015-12-27 NOTE — Progress Notes (Signed)
Chart forwarded.  

## 2015-12-27 NOTE — Patient Instructions (Addendum)
Everything seems to be going well 1.  Continue Keppra 1500mg  twice daily 2.  Follow up in one year or as needed.

## 2015-12-30 ENCOUNTER — Encounter: Payer: Self-pay | Admitting: Neurology

## 2015-12-31 ENCOUNTER — Encounter: Payer: Self-pay | Admitting: Endocrinology

## 2015-12-31 ENCOUNTER — Ambulatory Visit (INDEPENDENT_AMBULATORY_CARE_PROVIDER_SITE_OTHER): Payer: 59 | Admitting: Endocrinology

## 2015-12-31 VITALS — BP 122/72 | HR 68 | Temp 98.0°F | Ht 68.0 in | Wt 198.0 lb

## 2015-12-31 DIAGNOSIS — E038 Other specified hypothyroidism: Secondary | ICD-10-CM | POA: Diagnosis not present

## 2015-12-31 DIAGNOSIS — E559 Vitamin D deficiency, unspecified: Secondary | ICD-10-CM

## 2015-12-31 DIAGNOSIS — E049 Nontoxic goiter, unspecified: Secondary | ICD-10-CM | POA: Insufficient documentation

## 2015-12-31 LAB — T3, FREE: T3, Free: 2.7 pg/mL (ref 2.3–4.2)

## 2015-12-31 LAB — T4, FREE: Free T4: 1 ng/dL (ref 0.60–1.60)

## 2015-12-31 LAB — VITAMIN D 25 HYDROXY (VIT D DEFICIENCY, FRACTURES): VITD: 28.4 ng/mL — ABNORMAL LOW (ref 30.00–100.00)

## 2015-12-31 LAB — TSH: TSH: 2.03 u[IU]/mL (ref 0.35–4.50)

## 2015-12-31 NOTE — Progress Notes (Signed)
Subjective:    Patient ID: NEENAH MARCHAND, female    DOB: [birth year], 45 y.o.   MRN: [id number]  HPI Pt returns for f/u of primary hypothyroidism (dx'ed 2003; she has been on thyroid hormone supplement since then; she has been on the current dosage of 150 mcg/day, since late 2015; this is the highest dosage she has ever taken; [place] in 2013 showed heterogeneous tissue, but no nodule).  She takes synthroid as rx'ed.  She has slight pain at the anterior neck, but no assoc swelling. Hypovitaminosis-D: pt states she feels well in general, except she still has leg cramps.   Past Medical History  Diagnosis Date  . Thyroid disease     hypothyroidism  . Asthma   . Allergy     seasonal  . Heart murmur   . Urinary tract infection   . Urine incontinence   . Seizure disorder ([place])     No past surgical history on file.  Social History   Social History  . Marital Status: Married    Spouse Name: N/A  . Number of Children: N/A  . 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 Smoking status: Never Smoker   . Smokeless tobacco: Never Used  . Alcohol Use: No  . Drug Use: No  . Sexual Activity:    Partners: Male   Other Topics Concern  . 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 albuterol (PROVENTIL) (2.5 MG/3ML) 0.083% nebulizer solution Take 3 mLs (2.5 mg total) by nebulization every 6 (six) hours as needed for wheezing or shortness of breath. 75 mL 0  . albuterol (VENTOLIN HFA) 108 (90 Base) MCG/ACT inhaler Inhale 2 puffs into the lungs every 6 (six) hours as needed for wheezing. 18 g 1  . budesonide-formoterol (SYMBICORT) 160-4.5 MCG/ACT inhaler Take 2 puffs first thing in am and then another 2  puffs about 12 hours later. 1 Inhaler 11  . Cholecalciferol (VITAMIN D PO) Take 1 tablet by mouth daily. Reported on 12/31/2015    . levETIRAcetam (KEPPRA) 750 MG tablet Take 2 tablets (1,500 mg total) by mouth 2 (two) times daily. 1000 mg twice daily 120 tablet 2  . levothyroxine (SYNTHROID, LEVOTHROID) 150 MCG tablet Take 1 tablet (150 mcg total) by mouth daily before breakfast. 90 tablet 1   No current facility-administered medications on file prior to visit.    No Known Allergies  Family History  Problem Relation Age of Onset  . Heart disease Mother 39    open heart surgery  . Heart disease Maternal Grandmother   . Epilepsy Maternal Grandfather     BP 122/72 mmHg  Pulse 68  Temp(Src) 98 F (36.7 C) (Oral)  Ht 5\' 8"  (1.727 m)  Wt 198 lb (89.812 kg)  BMI 30.11 kg/m2  SpO2 97%  LMP 12/23/2015  Review of Systems She has leg cramps.  She occasionally has seizures, when she misses her meds    Objective:   Physical Exam VITAL SIGNS:  See vs page GENERAL: no distress NECK: right lobe is slightly and diffusely enlarged.   Lab Results  Component Value Date  TSH 2.03 12/31/2015   Vit-D=28    Assessment & Plan:  Hypothyroidism: well-replaced: Please continue the same medication.  Hypovitaminosis-D, mild.  Take vit-D, 2000 units/d.  Neck sxs, new uncertain etiology.   Patient is advised the following: Patient Instructions  blood tests are requested for you today.  We'll let you know about the results. Let's recheck the ultrasound.  you will receive a phone call, about a day and time for an appointment. Please come back for a follow-up appointment in 6-12 months.    Renato Shin, MD

## 2015-12-31 NOTE — Progress Notes (Signed)
Pre visit review using our clinic review tool, if applicable. No additional management support is needed unless otherwise documented below in the visit note. 

## 2015-12-31 NOTE — Patient Instructions (Signed)
blood tests are requested for you today.  We'll let you know about the results. Let's recheck the ultrasound.  you will receive a phone call, about a day and time for an appointment. Please come back for a follow-up appointment in 6-12 months.

## 2016-01-01 LAB — PTH, INTACT AND CALCIUM
Calcium: 8.5 mg/dL (ref 8.4–10.5)
PTH: 63 pg/mL (ref 14–64)

## 2016-01-02 LAB — THYROID PEROXIDASE ANTIBODY: THYROID PEROXIDASE ANTIBODY: 181 [IU]/mL — AB (ref <9)

## 2016-01-06 ENCOUNTER — Ambulatory Visit
Admission: RE | Admit: 2016-01-06 | Discharge: 2016-01-06 | Disposition: A | Discharge status: Home / Self Care | Payer: 59 | Source: Ambulatory Visit | Attending: Endocrinology | Admitting: Endocrinology

## 2016-01-06 DIAGNOSIS — E049 Nontoxic goiter, unspecified: Secondary | ICD-10-CM

## 2016-02-20 ENCOUNTER — Other Ambulatory Visit: Payer: Self-pay | Admitting: Internal Medicine

## 2016-02-28 IMAGING — MR MR HEAD W/O CM
10 of 13 series · 32 of 48 positions shown · non-contrast
Comparison: None.

CLINICAL DATA: 44-year-old female with epilepsy. Initial encounter.

EXAM:
MRI HEAD WITHOUT CONTRAST
TECHNIQUE: Multiplanar, multiecho pulse sequences of the brain and surrounding
structures were obtained without intravenous contrast.

[Series 2: FLAIR · sagittal · 5.0mm · 0.47mm/px · 1 of 23 slices shown (1 of 2)]
[im 1/23]
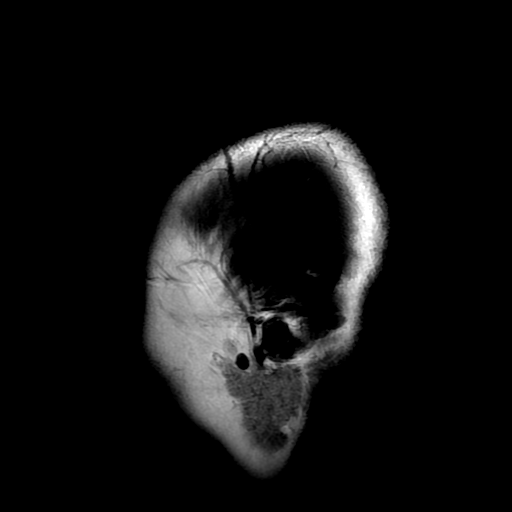

[Series 3: T2 · axial · 5.0mm · 0.47mm/px · 1 of 27 slices shown (1 of 3)]
[im 1/27]
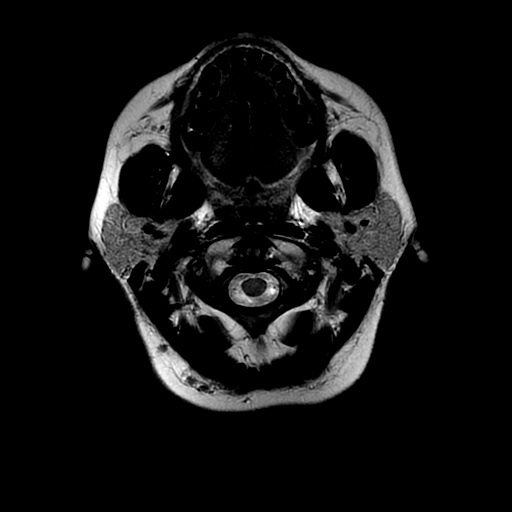

[Series 4: T2 · coronal · 3.5mm · 0.35mm/px · 2 of 31 slices shown (2 of 3)]
[im 1/31]
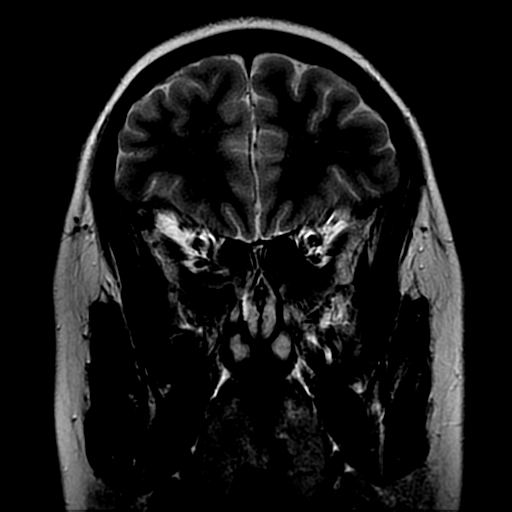
[im 31/31]
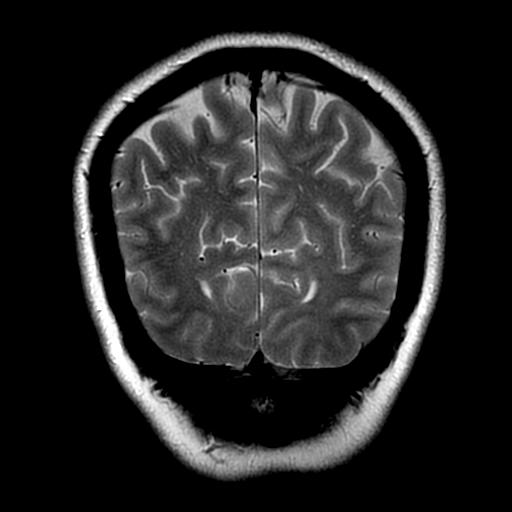

[Series 6: DWI · axial · 3.0mm · 0.94mm/px · z∈[-93,+59]mm · 7 of 108 slices shown (1 of 4)]
[im 1/108]
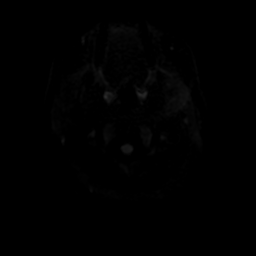
[im 18/108]
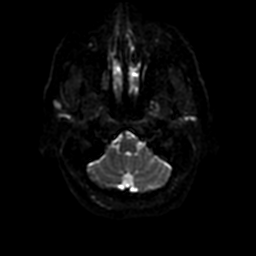
[im 36/108]
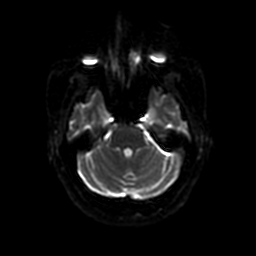
[im 54/108]
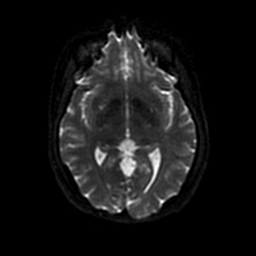
[im 72/108]
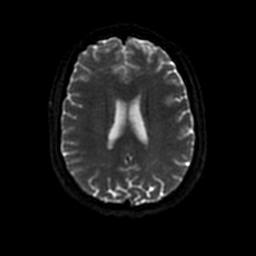
[im 90/108]
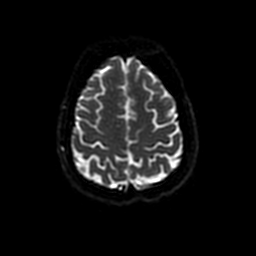
[im 108/108]
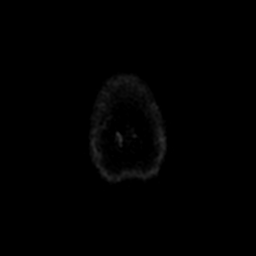

[Series 7: DWI · coronal · 5.0mm · 0.94mm/px · 5 of 74 slices shown (2 of 4)]
[im 1/74]
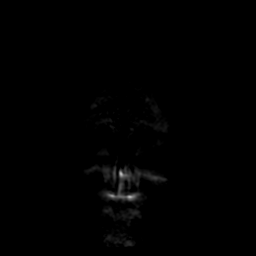
[im 19/74]
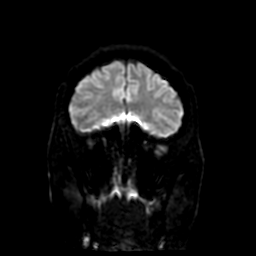
[im 37/74]
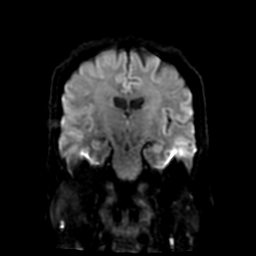
[im 55/74]
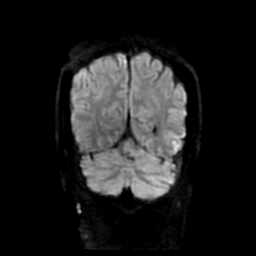
[im 74/74]
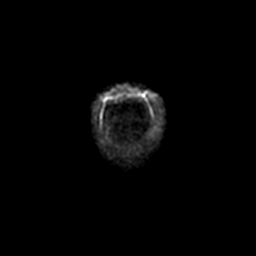

[Series 8: FLAIR · axial · 5.0mm · 0.47mm/px · z∈[-91,+58]mm · 2 of 27 slices shown (2 of 2)]
[im 1/27]
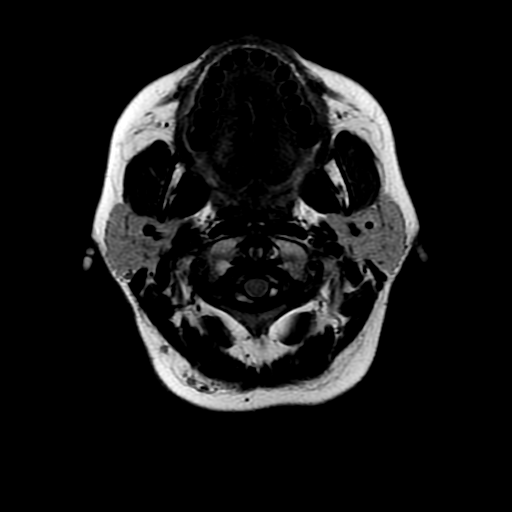
[im 27/27]
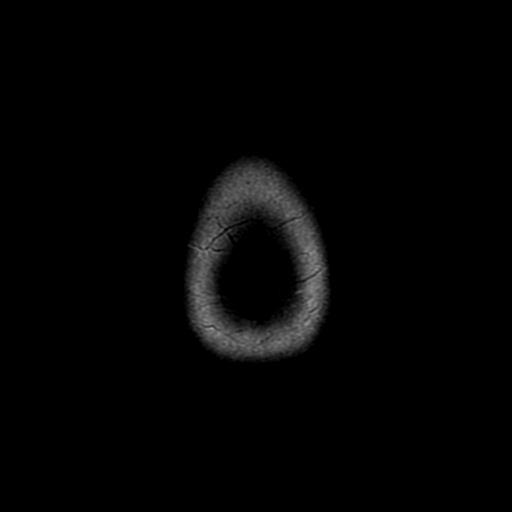

[Series 9: (person_name) · axial · 3.0mm · 0.47mm/px · z∈[-93,+35]mm · 6 of 108 slices shown]
[im 1/108]
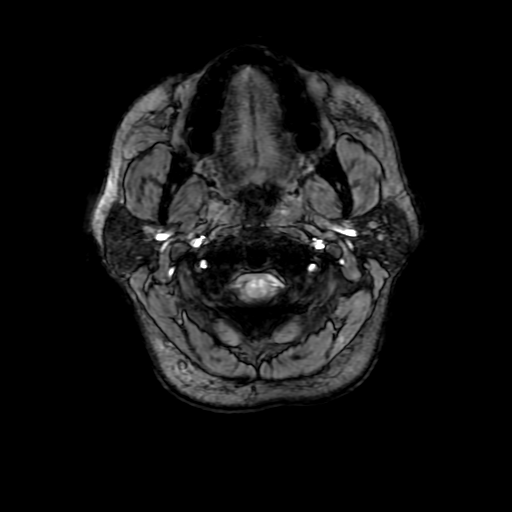
[im 18/108]
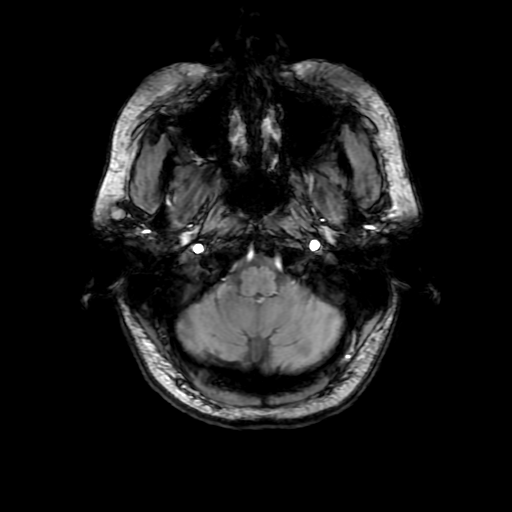
[im 36/108]
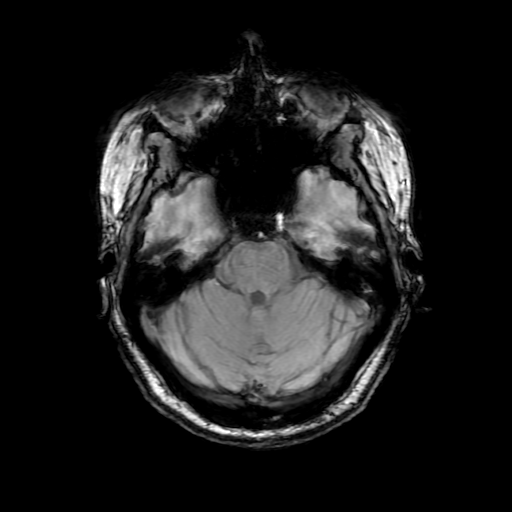
[im 54/108]
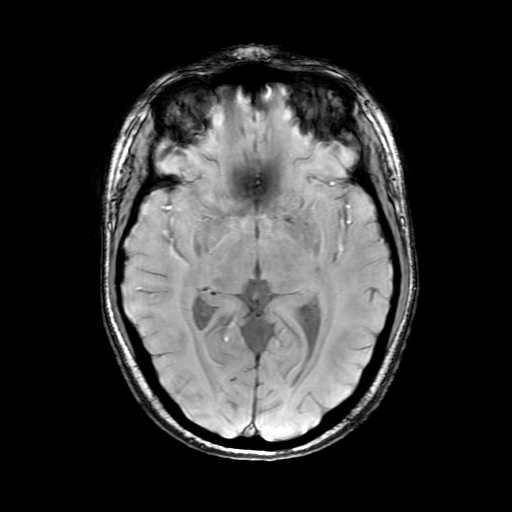
[im 72/108]
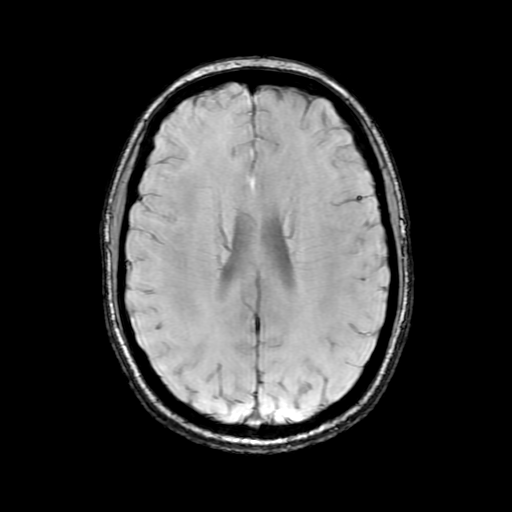
[im 90/108]
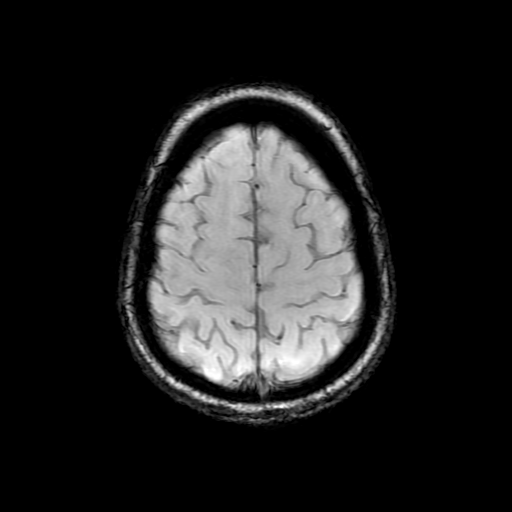

[Series 11: T2 · coronal · 5.0mm · 0.43mm/px · 2 of 31 slices shown (3 of 3)]
[im 1/31]
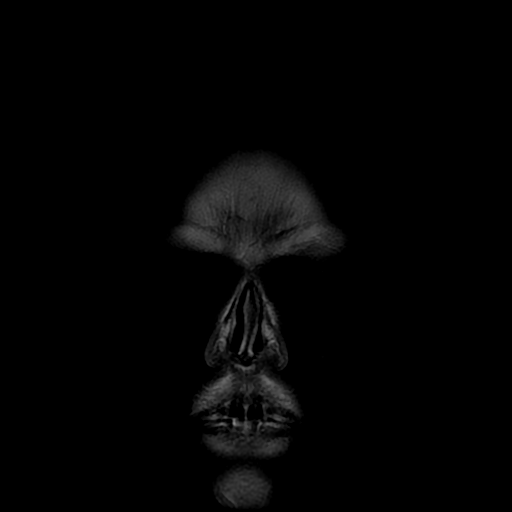
[im 31/31]
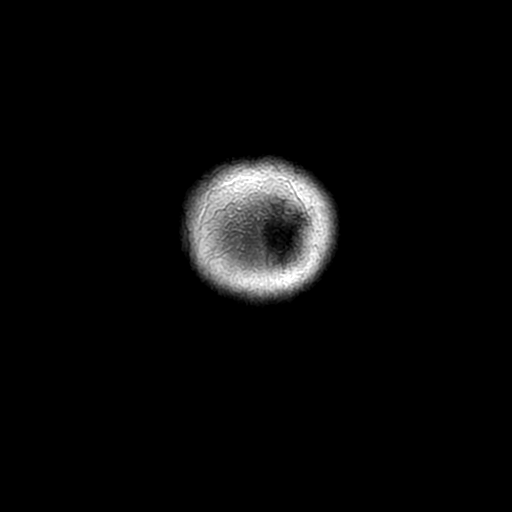

[Series 600: DWI · axial · 3.0mm · 0.94mm/px · z∈[-93,+59]mm · 4 of 54 slices shown (3 of 4)]
[im 1/54]
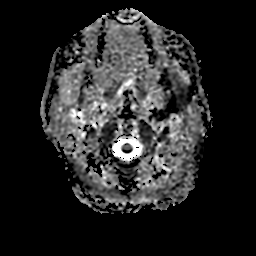
[im 18/54]
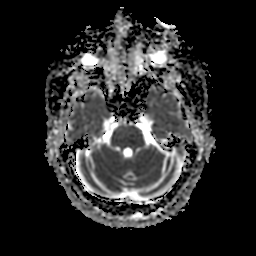
[im 36/54]
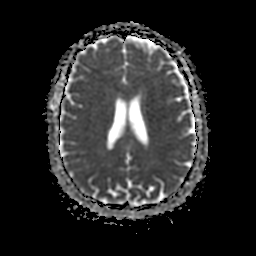
[im 54/54]
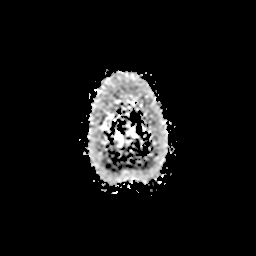

[Series 700: DWI · coronal · 5.0mm · 0.94mm/px · 2 of 36 slices shown (4 of 4)]
[im 1/36]
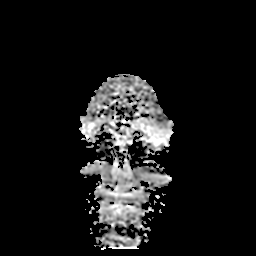
[im 36/36]
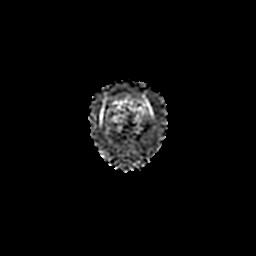

[32 of 48 positions shown; findings below may reference images not displayed]

FINDINGS: No acute infarct.

No intracranial hemorrhage.

No evidence of mesial temporal sclerosis.

No hydrocephalus.

No intracranial mass lesion noted on this unenhanced exam.

Major intracranial vascular structures are patent.

Minimal paranasal sinus mucosal thickening most notable anterior
medial left maxillary sinus knee in the infundibulum.

Cervical medullary junction, pituitary region, pineal region and
orbital structures unremarkable.

Increased number of periparotid lymph nodes of questionable
significance.
IMPRESSION: No acute infarct.

No intracranial hemorrhage.

No evidence of mesial temporal sclerosis.

No hydrocephalus.

No intracranial mass lesion noted on this unenhanced exam.

Increased number of periparotid lymph nodes of questionable
significance.

## 2016-03-12 ENCOUNTER — Other Ambulatory Visit: Payer: Self-pay

## 2016-03-12 MED ORDER — LEVETIRACETAM 750 MG PO TABS: 1500.0000 mg | ORAL_TABLET | Freq: Two times a day (BID) | ORAL | 2 refills | Status: DC

## 2016-03-12 NOTE — Telephone Encounter (Signed)
1.  Continue Keppra 1500mg  twice daily

## 2016-03-13 ENCOUNTER — Ambulatory Visit: Payer: 59 | Admitting: Internal Medicine

## 2016-03-19 ENCOUNTER — Encounter: Payer: Self-pay | Admitting: Internal Medicine

## 2016-03-19 ENCOUNTER — Ambulatory Visit (INDEPENDENT_AMBULATORY_CARE_PROVIDER_SITE_OTHER): Payer: 59 | Admitting: Internal Medicine

## 2016-03-19 DIAGNOSIS — T148 Other injury of unspecified body region: Secondary | ICD-10-CM

## 2016-03-19 DIAGNOSIS — T148XXA Other injury of unspecified body region, initial encounter: Secondary | ICD-10-CM

## 2016-03-19 MED ORDER — MELOXICAM 15 MG PO TABS: 15.0000 mg | ORAL_TABLET | Freq: Every day | ORAL | 0 refills | Status: DC

## 2016-03-19 NOTE — Progress Notes (Signed)
Pre visit review using our clinic review tool, if applicable. No additional management support is needed unless otherwise documented below in the visit note. 

## 2016-03-19 NOTE — Patient Instructions (Signed)
We have sent in meloxicam which is the anti-inflammatory that will help the muscle heal. Take 1 pill daily for the next 1-2 weeks to help.    Shoulder Range of Motion Exercises Shoulder range of motion (ROM) exercises are designed to keep the shoulder moving freely. They are often recommended for people who have shoulder pain. MOVEMENT EXERCISE When you are able, do this exercise 5-6 days per week, or as told by your health care provider. Work toward doing 2 sets of 10 swings. Pendulum Exercise How To Do This Exercise Lying Down 1. Lie face-down on a bed with your abdomen close to the side of the bed. 2. Let your arm hang over the side of the bed. 3. Relax your shoulder, arm, and hand. 4. Slowly and gently swing your arm forward and back. Do not use your neck muscles to swing your arm. They should be relaxed. If you are struggling to swing your arm, have someone gently swing it for you. When you do this exercise for the first time, swing your arm at a 15 degree angle for 15 seconds, or swing your arm 10 times. As pain lessens over time, increase the angle of the swing to 30-45 degrees. 5. Repeat steps 1-4 with the other arm. How To Do This Exercise While Standing 1. Stand next to a sturdy chair or table and hold on to it with your hand.  Bend forward at the waist.  Bend your knees slightly.  Relax your other arm and let it hang limp.  Relax the shoulder blade of the arm that is hanging and let it drop.  While keeping your shoulder relaxed, use body motion to swing your arm in small circles. The first time you do this exercise, swing your arm for about 30 seconds or 10 times. When you do it next time, swing your arm for a little longer.  Stand up tall and relax.  Repeat steps 1-7, this time changing the direction of the circles. 2. Repeat steps 1-8 with the other arm. STRETCHING EXERCISES Do these exercises 3-4 times per day on 5-6 days per week or as told by your health care provider.  Work toward holding the stretch for 20 seconds. Stretching Exercise 1 1. Lift your arm straight out in front of you. 2. Bend your arm 90 degrees at the elbow (right angle) so your forearm goes across your body and looks like the letter "L." 3. Use your other arm to gently pull the elbow forward and across your body. 4. Repeat steps 1-3 with the other arm. Stretching Exercise 2 You will need a towel or rope for this exercise. 1. Bend one arm behind your back with the palm facing outward. 2. Hold a towel with your other hand. 3. Reach the arm that holds the towel above your head, and bend that arm at the elbow. Your wrist should be behind your neck. 4. Use your free hand to grab the free end of the towel. 5. With the higher hand, gently pull the towel up behind you. 6. With the lower hand, pull the towel down behind you. 7. Repeat steps 1-6 with the other arm. STRENGTHENING EXERCISES Do each of these exercises at four different times of day (sessions) every day or as told by your health care provider. To begin with, repeat each exercise 5 times (repetitions). Work toward doing 3 sets of 12 repetitions or as told by your health care provider. Strengthening Exercise 1 You will need a light weight  for this activity. As you grow stronger, you may use a heavier weight. 1. Standing with a weight in your hand, lift your arm straight out to the side until it is at the same height as your shoulder. 2. Bend your arm at 90 degrees so that your fingers are pointing to the ceiling. 3. Slowly raise your hand until your arm is straight up in the air. 4. Repeat steps 1-3 with the other arm. Strengthening Exercise 2 You will need a light weight for this activity. As you grow stronger, you may use a heavier weight. 1. Standing with a weight in your hand, gradually move your straight arm in an arc, starting at your side, then out in front of you, then straight up over your head. 2. Gradually move your other arm  in an arc, starting at your side, then out in front of you, then straight up over your head. 3. Repeat steps 1-2 with the other arm. Strengthening Exercise 3 You will need an elastic band for this activity. As you grow stronger, gradually increase the size of the bands or increase the number of bands that you use at one time. 1. While standing, hold an elastic band in one hand and raise that arm up in the air. 2. With your other hand, pull down the band until that hand is by your side. 3. Repeat steps 1-2 with the other arm.   This information is not intended to replace advice given to you by your health care provider. Make sure you discuss any questions you have with your health care provider.   Document Released: 04/04/2003 Document Revised: 11/20/2014 Document Reviewed: 07/02/2014 Elsevier Interactive Patient Education [employer].

## 2016-03-19 NOTE — Progress Notes (Signed)
   Subjective:    Patient ID: Rebecca Silva, female    DOB: 1971/02/05, 45 y.o.   MRN: [id number]  HPI The patient is a 45 YO female coming in for left shoulder pain for about 2 weeks. She does not know of any injury or overuse that could have caused it. She was camping when it started and sleeping on a different mattress (in a camper). She denies hiking or other heavy lifting exercises. Did not improve when she got home. Hurts in her shoulder and over the collar bone and into the neck sometimes. Worse in the morning then gets better during the day. Then is worse when trying to sleep at night time. She denies that exercise makes it worse. No chest pain or SOB. No cough.   Review of Systems  Constitutional: Negative for activity change, appetite change, fatigue, fever and unexpected weight change.  HENT: Negative.   Eyes: Negative.   Respiratory: Negative for cough, chest tightness, shortness of breath and wheezing.   Cardiovascular: Negative for chest pain, palpitations and leg swelling.  Gastrointestinal: Negative for abdominal distention, abdominal pain, blood in stool, constipation, diarrhea and nausea.  Musculoskeletal: Positive for myalgias and neck pain. Negative for arthralgias and gait problem.  Neurological: Negative for dizziness, facial asymmetry and numbness.      Objective:   Physical Exam  Constitutional: She is oriented to person, place, and time. She appears well-developed and well-nourished.  HENT:  Head: Normocephalic and atraumatic.  Right Ear: External ear normal.  Left Ear: External ear normal.  Eyes: EOM are normal.  Neck: Normal range of motion.  Cardiovascular: Normal rate and regular rhythm.   No murmur heard. Pulmonary/Chest: Effort normal and breath sounds normal. No respiratory distress. She has no wheezes.  Abdominal: Soft.  Musculoskeletal: She exhibits no edema.  Tenderness over the platysmas muscle left side. Full ROM with but pain. No pain in the  arm. Neck is non-tender.   Neurological: She is alert and oriented to person, place, and time. Coordination normal.  Skin: Skin is warm and dry.   Vitals:   03/19/16 1305  BP: 112/80  Pulse: 64  Resp: 14  Temp: 98.5 F (36.9 C)  TempSrc: Oral  SpO2: 98%  Weight: 196 lb 6.4 oz (89.1 kg)  Height: 5' 8.5" (1.74 m)      Assessment & Plan:

## 2016-03-19 NOTE — Assessment & Plan Note (Signed)
Rx for meloxicam for the next 1-2 weeks and given some stretching exercises for ROM protection.

## 2016-03-27 IMAGING — US US SOFT TISSUE HEAD/NECK
1 series · 14 of 25 positions shown · non-contrast
Comparison: 12/13/2014

CLINICAL DATA: Periparotid Lymphadenopathy noted on recent MRI
examination.

EXAM:
ULTRASOUND OF HEAD/NECK SOFT TISSUES
TECHNIQUE: Ultrasound examination of the head and neck soft tissues was
performed in the area of clinical concern.

[Series 1: us soft tissue head/neck · 0.08mm/px · 14 of 49 slices shown]
[im 1/49]
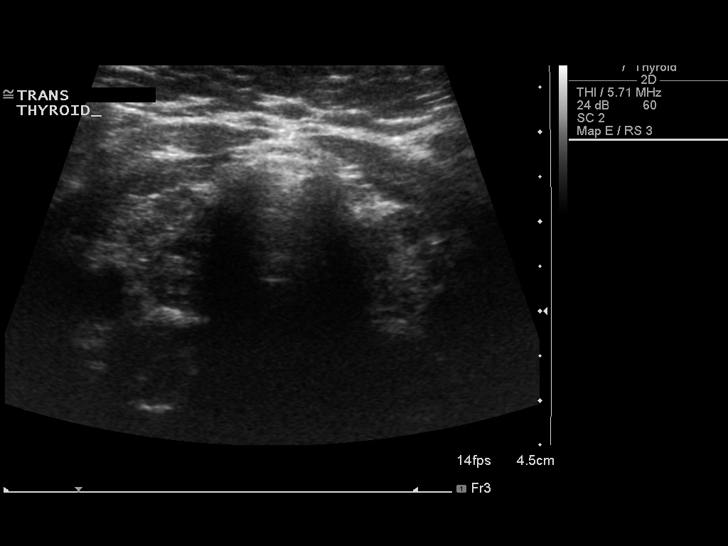
[im 5/49]
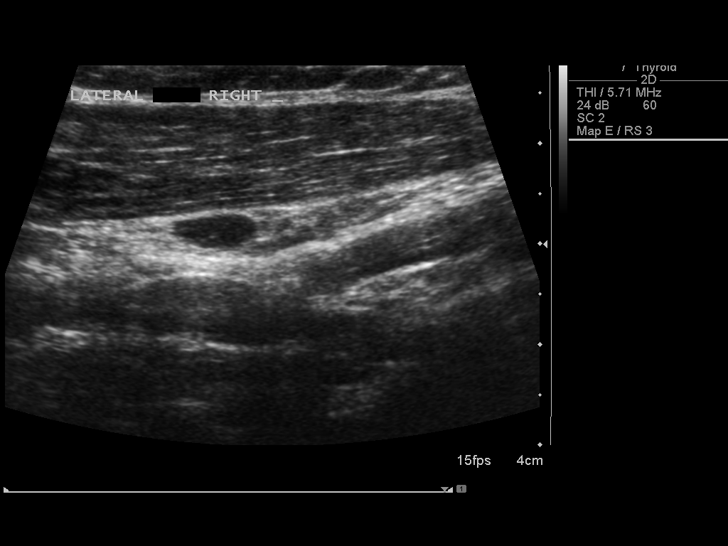
[im 9/49]
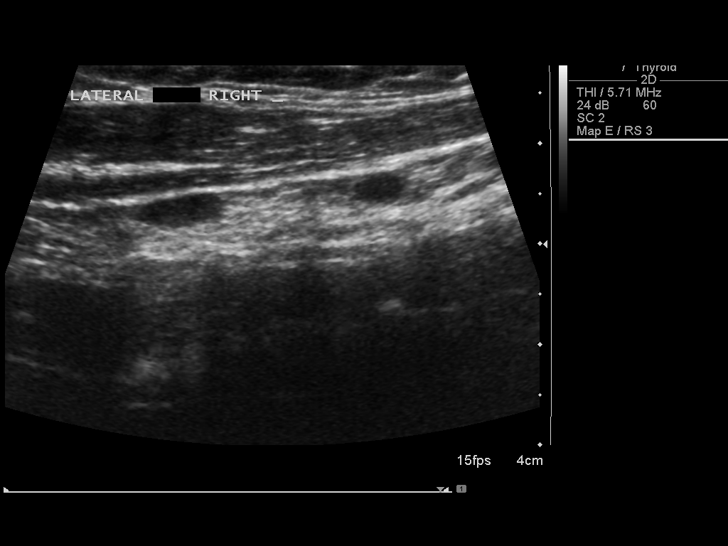
[im 13/49]
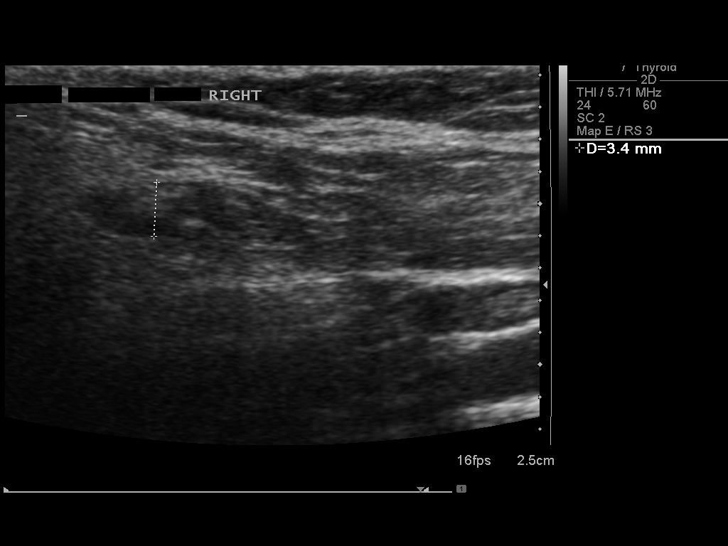
[im 17/49]
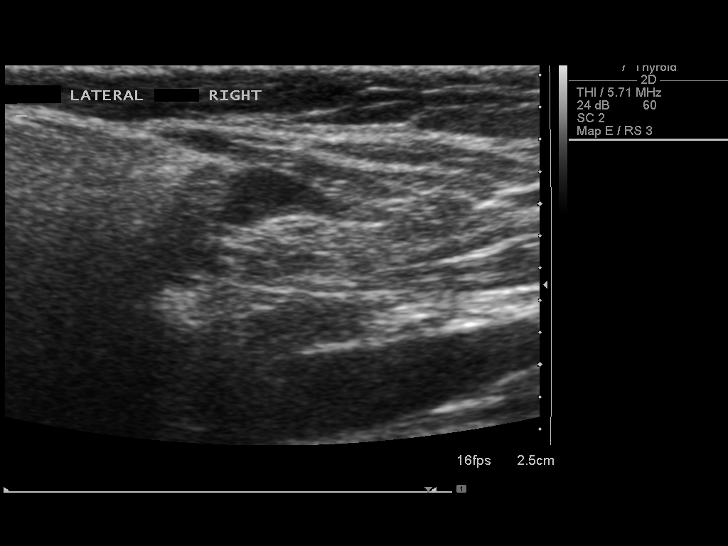
[im 19/49]
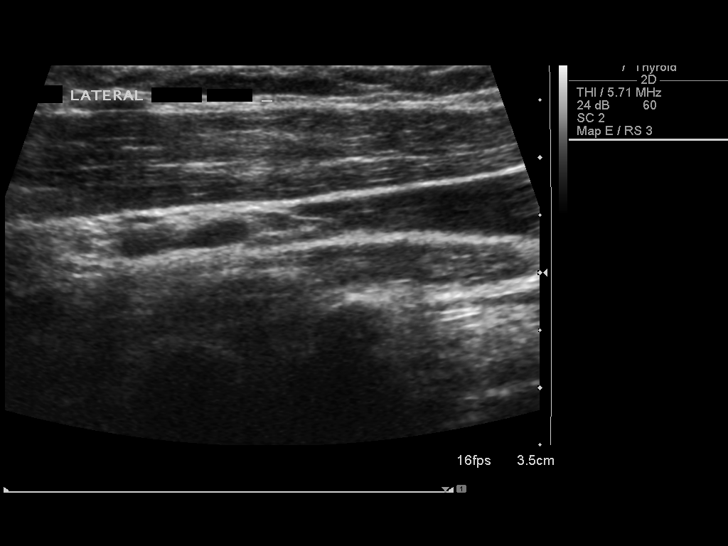
[im 23/49]
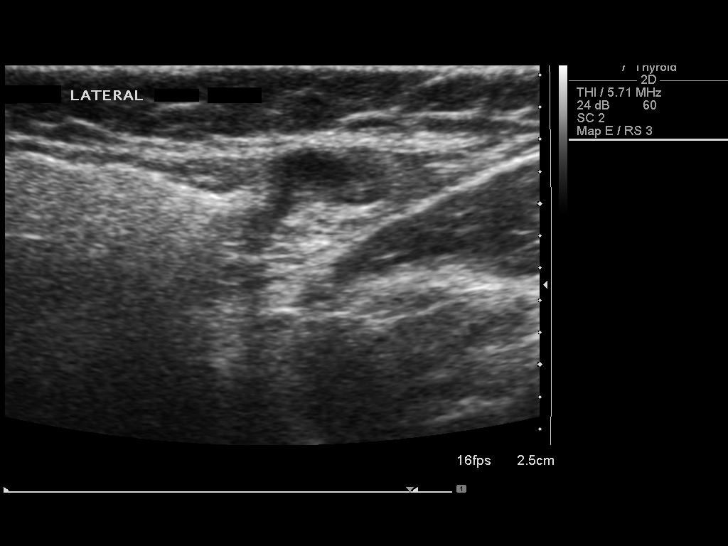
[im 27/49]
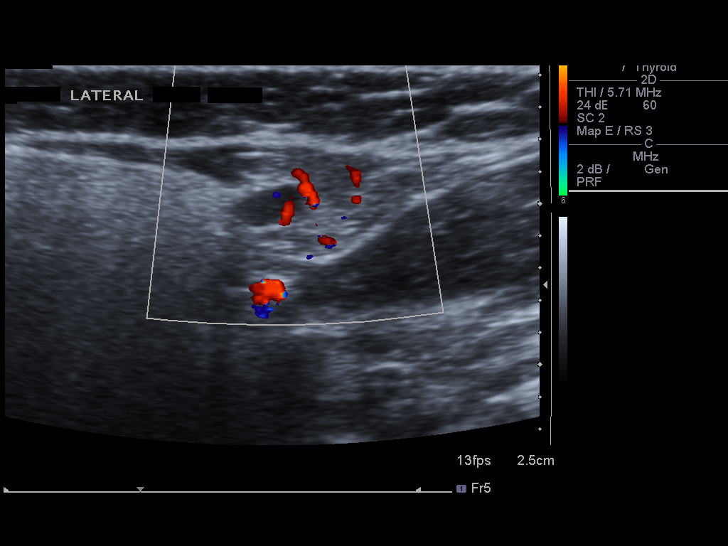
[im 31/49]
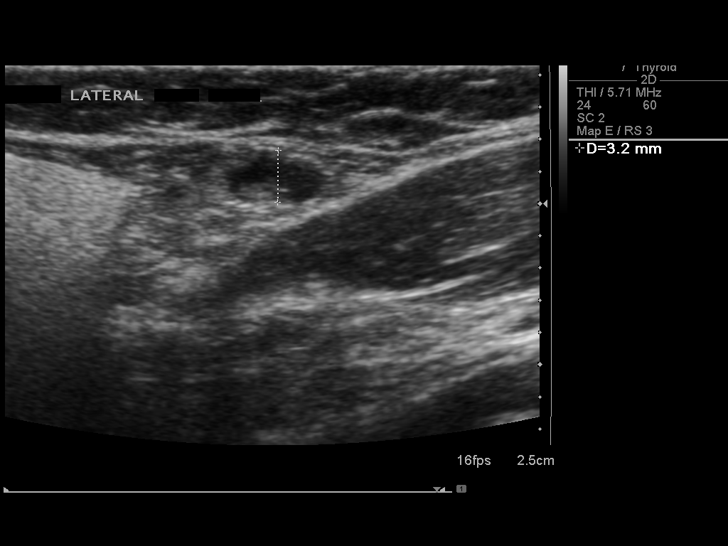
[im 33/49]
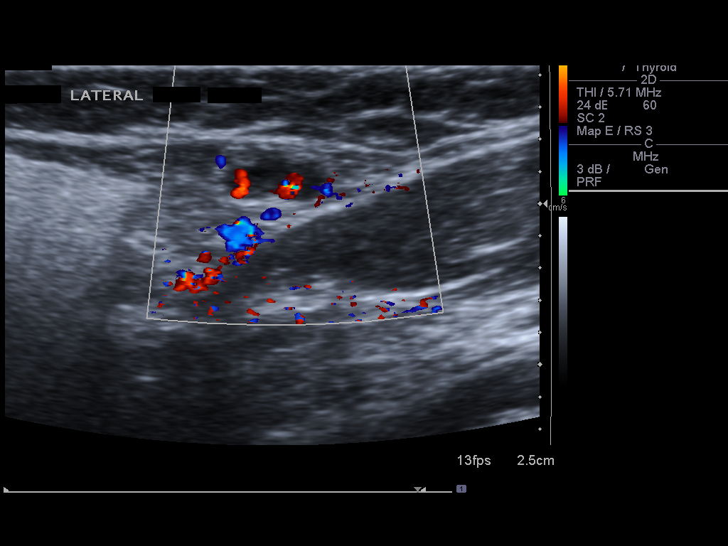
[im 37/49]
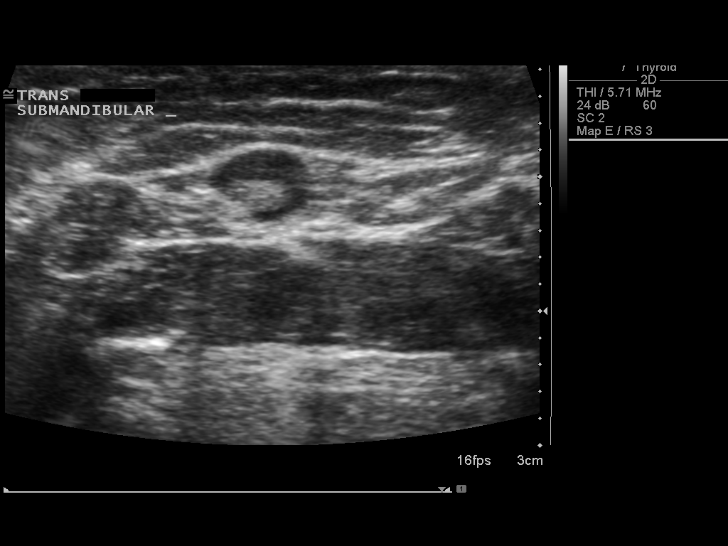
[im 41/49]
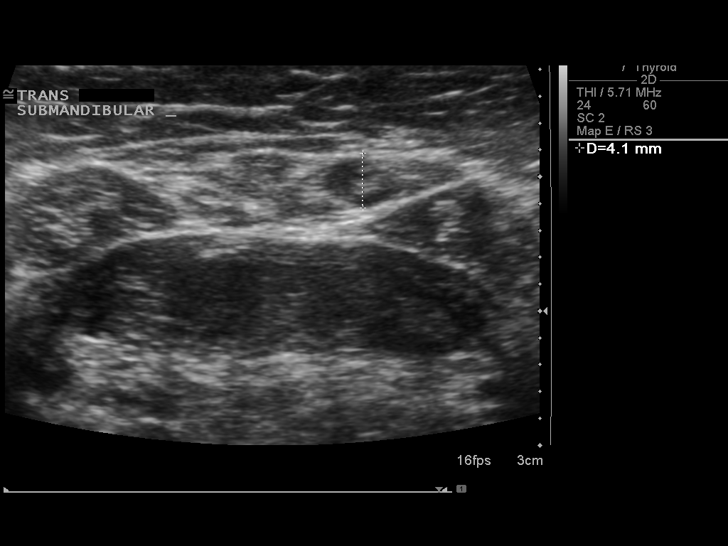
[im 45/49]
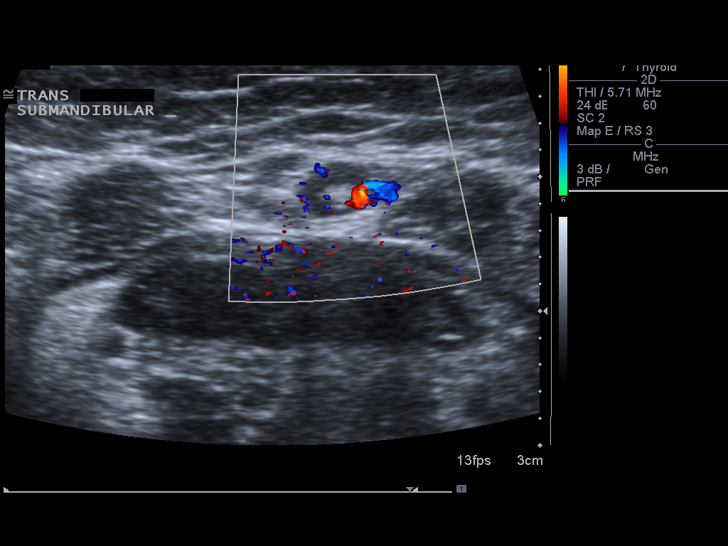
[im 49/49]
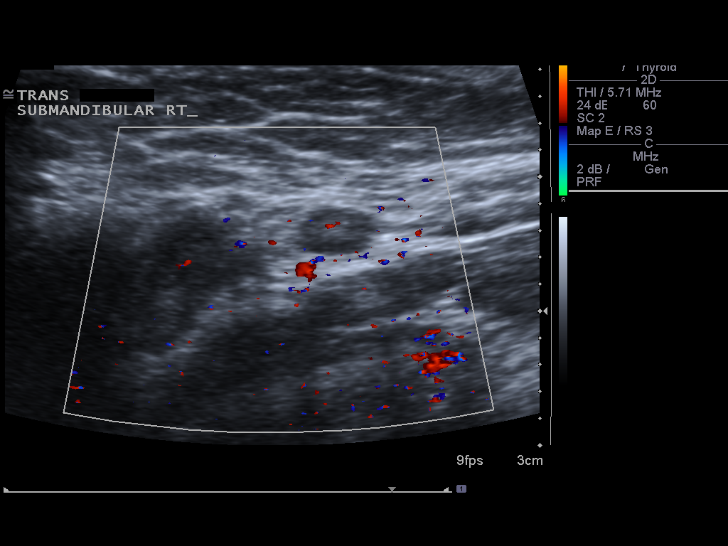

[14 of 25 positions shown; findings below may reference images not displayed]

FINDINGS: Multiple small lymph nodes are identified throughout the neck
bilaterally adjacent to the parotid gland and submandibular gland.
These all measure less than 5-6 mm and have normal fatty hila. No
parotid mass lesion is noted.
IMPRESSION: Multiple small lymph nodes are noted scattered throughout the neck.
These all demonstrate normal fatty hila an are small in size.
Clinical followup is recommended.

## 2016-04-20 ENCOUNTER — Telehealth: Payer: Self-pay | Admitting: Internal Medicine

## 2016-04-20 ENCOUNTER — Other Ambulatory Visit: Payer: Self-pay | Admitting: Internal Medicine

## 2016-04-20 NOTE — Telephone Encounter (Signed)
Spoke with the pt and advised that proair is fine to use instead of symbicort  She already has refills and does not need rx  She will continue to follow with PCP  Nothing further needed

## 2016-05-11 ENCOUNTER — Other Ambulatory Visit: Payer: Self-pay | Admitting: Internal Medicine

## 2016-06-05 ENCOUNTER — Other Ambulatory Visit: Payer: Self-pay | Admitting: Internal Medicine

## 2016-06-08 ENCOUNTER — Telehealth: Payer: Self-pay | Admitting: Internal Medicine

## 2016-06-08 NOTE — Telephone Encounter (Signed)
lmtcb for pt.  

## 2016-06-08 NOTE — Telephone Encounter (Signed)
Pt. Returned our call about her refill of her Symbicort that was denied by our office. I Informed pt. That it looks like she needs a follow up appt. To discuss with Dr. [name] to discuss if he wanted to keep her on that inhaler. She agreed to the follow up appt. And the appt, was made for this Thurs,. Nothing further is needed

## 2016-06-10 ENCOUNTER — Ambulatory Visit (INDEPENDENT_AMBULATORY_CARE_PROVIDER_SITE_OTHER): Payer: Managed Care, Other (non HMO) | Admitting: Internal Medicine

## 2016-06-10 ENCOUNTER — Encounter: Payer: Self-pay | Admitting: Internal Medicine

## 2016-06-10 VITALS — BP 124/78 | HR 75 | Ht 69.0 in | Wt 194.0 lb

## 2016-06-10 DIAGNOSIS — J4540 Moderate persistent asthma, uncomplicated: Secondary | ICD-10-CM

## 2016-06-10 MED ORDER — ALBUTEROL SULFATE HFA 108 (90 BASE) MCG/ACT IN AERS: INHALATION_SPRAY | RESPIRATORY_TRACT | 1 refills | Status: DC

## 2016-06-10 MED ORDER — BUDESONIDE-FORMOTEROL FUMARATE 80-4.5 MCG/ACT IN AERO: INHALATION_SPRAY | RESPIRATORY_TRACT | 11 refills | Status: DC

## 2016-06-10 NOTE — Assessment & Plan Note (Signed)
-  07/24/2014   > trial of qvar 80 2bid - PFTs 11/01/2014 FEV1  2.36 (69%) ratio 74 p 33% improvement from saba/ dlco 80% -  11/01/2014 p extensive coaching HFA effectiveness =    75% > try symbicort 160 2bid > marked clinical  improvement 02/08/15   -  06/10/2016  After extensive coaching HFA effectiveness =   90% from baseline 75% > try symb 80 2bid  Despite suboptima baseline hfa, All goals of chronic asthma control met including optimal function and elimination of symptoms with minimal need for rescue therapy. Therefore should be able to do stepdown to symb 80  2bid   Contingencies discussed in full including contacting this office immediately if not controlling the symptoms using the rule of two's.     F/u yearly, sooner prn

## 2016-06-10 NOTE — Patient Instructions (Addendum)
Try symbicort 80 Take 2 puffs first thing in am and then another 2 puffs about 12 hours later.   Work on inhaler technique:  relax and gently blow all the way out then take a nice smooth deep breath back in, triggering the inhaler at same time you start breathing in.  Hold for up to 5 seconds if you can. Blow out thru nose. Rinse and gargle with water when done      Only use your albuterol as a rescue medication to be used if you can't catch your breath by resting or doing a relaxed purse lip breathing pattern.  - The less you use it, the better it will work when you need it. - Ok to use up to 2 puffs  every 4 hours if you must but call for immediate appointment if use goes up over your usual need - Don't leave home without it !!  (think of it like the spare tire for your car)   Please schedule a follow up visit in 12  months but call sooner if needed

## 2016-06-10 NOTE — Progress Notes (Signed)
Subjective:    Patient ID: Rebecca Silva, female    DOB: 06/01/1971   MRN: [id number]    Brief patient profile:  43 yowf never smoker with tendency to bronchitis all her life sev times a year never required prednisone or breathing treatments until mid 10s but started then on prn saba  But never needed maint inhaler or more than twice weekly saba but since Dec 2014 needed it more often as much as twice daily then admitted:   Admit date: 06/06/2014 Discharge date: 06/08/2014   Discharge Diagnoses:  Principal Problem:  Asthma exacerbation   Hypothyroidism  Leukocytosis  Viral URI with cough  Tachycardia    History of Present Illness  06/20/2014 1st [facility] office visit/ [name]  / referred by [facility] after above admit  Chief Complaint  Patient presents with  . Pulmonary Consult    Self referral. Pt states dxed with Asthma approx 10 yrs ago. She c/o SOB and cough since mid Nov 2015.  Her cough is prod with minimal yellow sputum.  She is using rescue inhaler on average 2 x per day.     much better since admit but note over use of saba at baseline and still doe x exertion but not at rest/ sleeping or adls rec Plan A = automatic = qvar 80 Take 2 puffs first thing in am and then another 2 puffs about 12 hours later (other option is dulera but probably don't need it)  Plan B = backup saba hfa Plan C= Crisis = nebulizer if plan A and B don't work  Plan D = Doctor, call me if not satisfied with the above       11/01/2014 f/u ov/Aniruddh Ciavarella re: moderate persistent asthma  qvar 80 2 bid / more sob than cough  Chief Complaint  Patient presents with  . Follow-up    PFT done today. Pt states that her breathing is unchanged since her last visit. She uses rescue inhaler 1-2 x per wk. She c/o Qvar sometimes making her feel that she is about to have an asthma attack.   marked decrease in need for saba since starting qvar / no noct flares  rec Change qvar to symbicort 160 Take 2 puffs  first thing in am and then another 2 puffs about 12 hours later.  Only use your albuterol as a rescue medication     02/08/2015 f/u ov/Mariamawit Depaoli re: chronic asthma  Chief Complaint  Patient presents with  . Follow-up    Pt states breathing is doing well. She uses albuterol about once per month.   Not limited by breathing from desired activities   If the drugstore doesn't honor your zero coupon please let me know  Continue symbicort 160 Take 2 puffs first thing in am and then another 2 puffs about 12 hours later.  Only use your albuterol as a rescue medication     06/10/2016  f/u ov/Leray Garverick re: chronic asthma  As long as takes symb 160 regularly has done great x > 1 year with min saba use despite suboptimal hfa (see a/p)       No obvious day to day or daytime variabilty or assoc cough or  cp or chest tightness, subjective wheeze overt sinus or hb symptoms. No unusual exp hx or h/o childhood pna/ asthma or knowledge of premature birth.  Sleeping ok without nocturnal  or early am exacerbation  of respiratory  c/o's or need for noct saba. Also denies any obvious fluctuation of  symptoms with weather or environmental changes or other aggravating or alleviating factors except as outlined above   Current Medications, Allergies, Complete Past Medical History, Past Surgical History, Family History, and Social History were reviewed in [employer] record.  ROS  The following are not active complaints unless bolded sore throat, dysphagia, dental problems, itching, sneezing,  nasal congestion or excess/ purulent secretions, ear ache,   fever, chills, sweats, unintended wt loss, pleuritic or exertional cp, hemoptysis,  orthopnea pnd or leg swelling, presyncope, palpitations, heartburn, abdominal pain, anorexia, nausea, vomiting, diarrhea  or change in bowel or urinary habits, change in stools or urine, dysuria,hematuria,  rash, arthralgias, visual complaints, headache, numbness weakness or  ataxia or problems with walking or coordination,  change in mood/affect or memory.                      Objective:   Physical Exam  amb wf nad   07/24/2014          200  > 11/01/2014  199 >  02/08/2015 198 > 06/10/2016   194     06/20/14 199 lb (90.266 kg)  06/06/14 196 lb (88.905 kg)  06/06/14 196 lb (88.905 kg)    Vital signs reviewed  HEENT: nl dentition, turbinates, and orophanx. Nl external ear canals without cough reflex   NECK :  without JVD/Nodes/TM/ nl carotid upstrokes bilaterally   LUNGS: no acc muscle use, clear to A and P bilaterally without cough on insp or exp maneuvers   CV:  RRR  no s3 or murmur or increase in P2, no edema   ABD:  soft and nontender with nl excursion in the supine position. No bruits or organomegaly, bowel sounds nl  MS:  warm without deformities, calf tenderness, cyanosis or clubbing  SKIN: warm and dry without lesions    NEURO:  alert, approp, no deficits                  Assessment & Plan:

## 2016-06-28 NOTE — Progress Notes (Signed)
Subjective:    Patient ID: Rebecca Silva, female    DOB: [birth year], 45 y.o.   MRN: [id number]  HPI Pt returns for f/u of primary hypothyroidism (dx'ed 2003; she has been on thyroid hormone supplement since then; she has been on the current dosage of 150 mcg/day, since late 2015; this is the highest dosage she has ever taken; [place] in 2013 and 2017 showed heterogeneous tissue, but no nodule).  She takes synthroid 150/d, as rx'ed.  Hypovitaminosis-D: pt states she feels well in general, except she still has intermittent leg cramps.  She takes 500 units/d.  Past Medical History:  Diagnosis Date  . Allergy    seasonal  . Asthma   . Heart murmur   . Seizure disorder ([place])   . Thyroid disease    hypothyroidism  . Urinary tract infection   . Urine incontinence     No past surgical history on file.  Social History   Social History  . Marital status: Married    Spouse name: N/A  . Number of children: N/A  . 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 Smoking status: Never Smoker  . Smokeless tobacco: Never Used  . Alcohol use No  . Drug use: No  . Sexual activity: Yes    Partners: Male   Other Topics Concern  . 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 albuterol (PROAIR HFA) 108 (90 Base) MCG/ACT inhaler 2 puffs every 4 hours as needed only  if your can't catch your breath 1 Inhaler 1  . albuterol (PROVENTIL) (2.5 MG/3ML) 0.083% nebulizer solution Take 3 mLs (2.5 mg total) by nebulization every 6 (six) hours as needed for wheezing or shortness of breath. 75 mL 0  . budesonide-formoterol (SYMBICORT) 80-4.5 MCG/ACT inhaler Take 2 puffs first thing in am and then another 2 puffs about 12  hours later. 1 Inhaler 11  . levETIRAcetam (KEPPRA) 750 MG tablet Take 2 tablets (1,500 mg total) by mouth 2 (two) times daily. 1000 mg twice daily 120 tablet 2  . Cholecalciferol (VITAMIN D PO) Take 1 tablet by mouth daily. Reported on 12/31/2015     No current facility-administered medications on file prior to visit.     No Known Allergies  Family History  Problem Relation Age of Onset  . Heart disease Mother 8    open heart surgery  . Heart disease Maternal Grandmother   . Epilepsy Maternal Grandfather     BP 132/76   Pulse 82   Ht 5\' 9"  (1.753 m)   Wt 195 lb (88.5 kg)   SpO2 97%   BMI 28.80 kg/m    Review of Systems Denies edema    Objective:   Physical Exam VITAL SIGNS:  See vs page GENERAL: no distress NECK: thyroid is slightly enlarged, R>L.    Lab Results  Component Value Date   TSH 1.91 07/01/2016  25-OH vit-D=21    Assessment & Plan:  Hypothyroidism, due for recheck.  vit-D deficiency: she needs increased rx: increase to 2000 units per day  Patient is advised the following: Patient Instructions  blood tests are  requested for you today.  We'll let you know about the results.  Please come back for a follow-up appointment in 6-12 months.

## 2016-07-01 ENCOUNTER — Ambulatory Visit (INDEPENDENT_AMBULATORY_CARE_PROVIDER_SITE_OTHER): Payer: Managed Care, Other (non HMO) | Admitting: Endocrinology

## 2016-07-01 ENCOUNTER — Encounter: Payer: Self-pay | Admitting: Endocrinology

## 2016-07-01 VITALS — BP 132/76 | HR 82 | Ht 69.0 in | Wt 195.0 lb

## 2016-07-01 DIAGNOSIS — G40309 Generalized idiopathic epilepsy and epileptic syndromes, not intractable, without status epilepticus: Secondary | ICD-10-CM | POA: Diagnosis not present

## 2016-07-01 DIAGNOSIS — E038 Other specified hypothyroidism: Secondary | ICD-10-CM

## 2016-07-01 DIAGNOSIS — E063 Autoimmune thyroiditis: Secondary | ICD-10-CM

## 2016-07-01 DIAGNOSIS — E559 Vitamin D deficiency, unspecified: Secondary | ICD-10-CM

## 2016-07-01 LAB — MAGNESIUM: Magnesium: 2.2 mg/dL (ref 1.5–2.5)

## 2016-07-01 LAB — BASIC METABOLIC PANEL
BUN: 10 mg/dL (ref 6–23)
CO2: 27 mEq/L (ref 19–32)
Calcium: 8.8 mg/dL (ref 8.4–10.5)
Chloride: 107 mEq/L (ref 96–112)
Creatinine, Ser: 0.8 mg/dL (ref 0.40–1.20)
GFR: 81.44 mL/min (ref >60.00)
GLUCOSE: 98 mg/dL (ref 70–99)
POTASSIUM: 4.1 meq/L (ref 3.5–5.1)
SODIUM: 139 meq/L (ref 135–145)

## 2016-07-01 LAB — TSH: TSH: 1.91 u[IU]/mL (ref 0.35–4.50)

## 2016-07-01 LAB — VITAMIN D 25 HYDROXY (VIT D DEFICIENCY, FRACTURES): VITD: 20.88 ng/mL — AB (ref 30.00–100.00)

## 2016-07-01 NOTE — Patient Instructions (Addendum)
blood tests are requested for you today.  We'll let you know about the results.   Please come back for a follow-up appointment in 6-12 months.   

## 2016-07-28 ENCOUNTER — Telehealth: Payer: Self-pay | Admitting: Internal Medicine

## 2016-07-28 ENCOUNTER — Ambulatory Visit (INDEPENDENT_AMBULATORY_CARE_PROVIDER_SITE_OTHER): Payer: Managed Care, Other (non HMO) | Admitting: Family Medicine

## 2016-07-28 ENCOUNTER — Emergency Department (HOSPITAL_COMMUNITY)
Admission: EM | Admit: 2016-07-28 | Discharge: 2016-07-28 | Disposition: A | Discharge status: Home / Self Care | Payer: Managed Care, Other (non HMO) | Attending: Emergency Medicine | Admitting: Emergency Medicine

## 2016-07-28 ENCOUNTER — Encounter: Payer: Self-pay | Admitting: Family Medicine

## 2016-07-28 ENCOUNTER — Encounter (HOSPITAL_COMMUNITY): Payer: Self-pay | Admitting: Emergency Medicine

## 2016-07-28 ENCOUNTER — Emergency Department (HOSPITAL_COMMUNITY): Payer: Managed Care, Other (non HMO)

## 2016-07-28 VITALS — BP 124/82 | HR 75 | Temp 97.8°F | Ht 69.0 in | Wt 197.2 lb

## 2016-07-28 DIAGNOSIS — R079 Chest pain, unspecified: Secondary | ICD-10-CM | POA: Diagnosis not present

## 2016-07-28 DIAGNOSIS — Z79899 Other long term (current) drug therapy: Secondary | ICD-10-CM | POA: Diagnosis not present

## 2016-07-28 DIAGNOSIS — M79602 Pain in left arm: Secondary | ICD-10-CM | POA: Diagnosis not present

## 2016-07-28 DIAGNOSIS — R0789 Other chest pain: Secondary | ICD-10-CM | POA: Diagnosis not present

## 2016-07-28 DIAGNOSIS — R42 Dizziness and giddiness: Secondary | ICD-10-CM | POA: Insufficient documentation

## 2016-07-28 DIAGNOSIS — E039 Hypothyroidism, unspecified: Secondary | ICD-10-CM | POA: Insufficient documentation

## 2016-07-28 DIAGNOSIS — J45909 Unspecified asthma, uncomplicated: Secondary | ICD-10-CM | POA: Insufficient documentation

## 2016-07-28 DIAGNOSIS — M542 Cervicalgia: Secondary | ICD-10-CM | POA: Insufficient documentation

## 2016-07-28 LAB — BASIC METABOLIC PANEL
Anion gap: 5 (ref 5–15)
BUN: 11 mg/dL (ref 6–20)
CALCIUM: 8.8 mg/dL — AB (ref 8.9–10.3)
CO2: 27 mmol/L (ref 22–32)
CREATININE: 0.73 mg/dL (ref 0.44–1.00)
Chloride: 106 mmol/L (ref 101–111)
Glucose, Bld: 88 mg/dL (ref 65–99)
Potassium: 3.9 mmol/L (ref 3.5–5.1)
Sodium: 138 mmol/L (ref 135–145)

## 2016-07-28 LAB — CBC
HCT: 31.6 % — ABNORMAL LOW (ref 36.0–46.0)
Hemoglobin: 10.3 g/dL — ABNORMAL LOW (ref 12.0–15.0)
MCH: 24.1 pg — AB (ref 26.0–34.0)
MCHC: 32.6 g/dL (ref 30.0–36.0)
MCV: 74 fL — ABNORMAL LOW (ref 78.0–100.0)
PLATELETS: 271 10*3/uL (ref 150–400)
RBC: 4.27 MIL/uL (ref 3.87–5.11)
RDW: 15.6 % — ABNORMAL HIGH (ref 11.5–15.5)
WBC: 10.6 10*3/uL — ABNORMAL HIGH (ref 4.0–10.5)

## 2016-07-28 LAB — I-STAT TROPONIN, ED: TROPONIN I, POC: 0 ng/mL (ref 0.00–0.08)

## 2016-07-28 MED ORDER — NAPROXEN 500 MG PO TABS: 500.0000 mg | ORAL_TABLET | Freq: Two times a day (BID) | ORAL | 0 refills | Status: DC

## 2016-07-28 NOTE — Progress Notes (Signed)
HPI:  Rebecca Silva is a very pleasant 46 year old with a complicated past medical history significant for asthma, hypothyroidism, epilepsy, seasonal allergies, and a heart murmur per history here for an acute visit for left shoulder pain. The pain started acutely today on her way to work as she was driving. She reports she had a Dull mild pain in the left collarbone region that radiated down the left arm and up the anterior left side of her neck. The pain lasted for about 15 minutes then resolved spontaneously. She did take a baby aspirin when she got to work. She denies any other symptoms with the episode, shortness of breath, dyspnea on exertion, palpitations, wheezing, nausea, vomiting, weakness, numbness, speech changes. She did have a mild headache this morning on the left frontal side of her head, this is not unusual for her. She also has had some mild sinus issues. She has a history of migraine headaches. This did not seem like a bad headache for her and is resolved. She reports she had some left shoulder pain in the past and did see her PCP for this. It resolved after about a week. No recent trauma or injury. She is worried about her heart because her mother had heart disease at age 5. The patient herself reports she has no personal history of cholesterol, diabetes, blood pressure heart problems. Reports hx cardiology evaluation several years ago neg for murmur.  ROS: See pertinent positives and negatives per HPI.  Past Medical History:  Diagnosis Date  . Allergy    seasonal  . Asthma   . Heart murmur   . Seizure disorder ([place])   . Thyroid disease    hypothyroidism  . Urinary tract infection   . Urine incontinence     No past surgical history on file.  Family History  Problem Relation Age of Onset  . Heart disease Mother 66    open heart surgery  . Heart disease Maternal Grandmother   . Epilepsy Maternal Grandfather     Social History   Social History  . Marital status:  Married    Spouse name: N/A  . Number of children: N/A  . 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 Smoking status: Never Smoker  . Smokeless tobacco: Never Used  . Alcohol use No  . Drug use: No  . Sexual activity: Yes    Partners: Male   Other Topics Concern  . 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  albuterol (PROAIR HFA) 108 (90 Base) MCG/ACT inhaler, 2 puffs every 4 hours as needed only  if your can't catch your breath, Disp: 1 Inhaler, Rfl: 1 .  albuterol (PROVENTIL) (2.5 MG/3ML) 0.083% nebulizer solution, Take 3 mLs (2.5 mg total) by nebulization every 6 (six) hours as needed for wheezing or shortness of breath., Disp: 75 mL, Rfl: 0 .  budesonide-formoterol (SYMBICORT) 80-4.5 MCG/ACT inhaler, Take 2 puffs first thing in am and then another 2 puffs about 12 hours later., Disp: 1 Inhaler, Rfl: 11 .  Cholecalciferol (VITAMIN D PO), Take 1 tablet by mouth daily. Reported on 12/31/2015, Disp: , Rfl:  .  levETIRAcetam (KEPPRA) 750 MG tablet, Take 2 tablets (1,500  mg total) by mouth 2 (two) times daily. 1000 mg twice daily, Disp: 120 tablet, Rfl: 2 .  levothyroxine (SYNTHROID, LEVOTHROID) 150 MCG tablet, Take 150 mcg by mouth daily before breakfast., Disp: , Rfl:   EXAM:  Vitals:   07/28/16 1039  BP: 124/82  Pulse: 75  Temp: 97.8 F (36.6 C)    Body mass index is 29.12 kg/m.  GENERAL: vitals reviewed and listed above, alert, oriented, appears well hydrated and in no acute distress  HEENT: atraumatic, conjunttiva clear, no obvious abnormalities on inspection of external nose and ears  NECK: no obvious masses on inspection, no carotid bruits, normal range of motion of the headache neck without reproduction of symptoms, normal palpatory  exam of the neck except for some mild cervical paraspinal muscle tenderness to palpation bilaterally  LUNGS: clear to auscultation bilaterally, no wheezes, rales or rhonchi, good air movement  CV: HRRR, barely audible 1/VI SEM, no peripheral edema  MS/NEURO: moves all extremities without noticeable abnormality, she actually has some mild tenderness to palpation in the upper fibers of her left pectoralis muscle with some reproduction of her pain, otherwise no significant muscle tenderness except for in the neck as noted above. Cranial nerves II through XII grossly intact, finger to nose normal, normal strength and sensitivity to light touch throughout in both upper extremities.  PSYCH: pleasant and cooperative, no obvious depression or anxiety  ASSESSMENT AND PLAN:  Discussed the following assessment and plan:  Left arm pain - Plan: EKG 12-Lead, Exercise Tolerance Test  Left-sided chest wall pain - Plan: Exercise Tolerance Test  -we discussed possible serious and likely etiologies, workup and treatment, treatment risks and return precautions - suspect musculoskeletal given reproducible pain, anxiety, atypical migraine (or combo of) vs other -EKG with NSR and no obvious acute ischemic findings, reviewed case and EKG with another physician provider as well given her concerns  -after this discussion, Rebecca Silva opted for stress test, tx for muscle soreness, she plans to also discuss with neurology, ER precautions discussed should symptoms recur in interim -follow up advised with PCP in 1 month -of course, we advised Rebecca Silva  to return or notify a doctor immediately if symptoms worsen or persist or new concerns arise.   Patient Instructions  Schedule follow up with your doctor in 1 month.  We placed a referral for you as discussed for the stress test.If you have not heard from [place] regarding this appointment in 1 week please contact our office.  Seek emergency care if symptoms recur - chest pain  that radiates to jaw or arm or accompanied by other worrisome symptoms as we discussed.    Colin Benton R., DO

## 2016-07-28 NOTE — Patient Instructions (Signed)
Schedule follow up with your doctor in 1 month.  We placed a referral for you as discussed for the stress test.If you have not heard from [place] regarding this appointment in 1 week please contact our office.  Seek emergency care if symptoms recur - chest pain that radiates to jaw or arm or accompanied by other worrisome symptoms as we discussed.

## 2016-07-28 NOTE — Telephone Encounter (Signed)
[facility] - Client [place] [facility]  Patient Name: Rebecca Silva  DOB: [birth year]    Initial Comment Caller states she is wanting to make an appt. She is having pain in her left shoulder that radiates to her jaw and down her arm. Symptoms came on suddenly. Denies injury and no chest pain.    Nurse Assessment  Nurse: Wynetta Emery, RN, Baker Janus Date/Time Eilene Ghazi Time): 07/28/2016 9:22:36 AM  Confirm and document reason for call. If symptomatic, describe symptoms. ---She is having pain in her left shoulder that radiates to her jaw and down her arm onset this am and is intermittent. Symptoms came on suddenly. Denies injury and no chest pain.  Does the patient have any new or worsening symptoms? ---Yes  Will a triage be completed? ---Yes  Related visit to physician within the last 2 weeks? ---No  Does the PT have any chronic conditions? (i.e. diabetes, asthma, etc.) ---No  Is the patient pregnant or possibly pregnant? (Ask all females between the ages of 76-55) ---No  Is this a behavioral health or substance abuse call? ---No     Guidelines    Guideline Title Affirmed Question Affirmed Notes  Shoulder Pain Shoulder pain (all triage questions negative)    Final Disposition User   See Physician within 4 Hours (or PCP triage) Wynetta Emery, RN, Baker Janus    Comments  NOTE: no availability today for MD at [facility] office; 1/03-2017 11am Jethro Bolus MD for shoulder jaw pain

## 2016-07-28 NOTE — Telephone Encounter (Signed)
Visit with Dr. Maudie Mercury noted.

## 2016-07-28 NOTE — ED Triage Notes (Signed)
Patient complaining of left arm pain radiating into left shoulder with left sided facial numbness since 0800 this morning. States "I went to my doctor and they did an EKG and it was fine but I just don't feel right."

## 2016-07-28 NOTE — Discharge Instructions (Signed)
Workup for the chest pain without any acute findings. Trial of Naprosyn see if it helps with the shoulder and arm pain. Also EKG showed no acute cardiac changes. 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 Recommend follow-up with cardiology for this. Return for any new or worse symptoms. Work note provided.

## 2016-07-28 NOTE — Progress Notes (Signed)
Pre visit review using our clinic review tool, if applicable. No additional management support is needed unless otherwise documented below in the visit note. 

## 2016-07-28 NOTE — ED Provider Notes (Signed)
[place] DEPT Provider Note   CSN: [id number] Arrival date & time: 07/28/16  1827     History   Chief Complaint Chief Complaint  Patient presents with  . Arm Pain  . Numbness    HPI Rebecca Silva is a 46 y.o. female.  Patient with onset of left arm shoulder discomfort and left lateral chest area at 8:00 this morning while driving to work. Patient felt hot but did not get diaphoretic no true fevers. Had some lightheadedness no syncope. No diaphoresis no nausea vomiting. No prior history of similar problem. No cardiac risk factors. Patient does have a history of thyroid disease and is on Synthroid. Patient has a history of seizure disorder and is on Keppra.      Past Medical History:  Diagnosis Date  . Allergy    seasonal  . Asthma   . Heart murmur   . Seizure disorder ([place])   . Thyroid disease    hypothyroidism  . Urinary tract infection   . Urine incontinence     Patient Active Problem List   Diagnosis Date Noted  . Muscle strain 03/19/2016  . Routine general medical examination at a health care facility 04/15/2015  . Generalized idiopathic epilepsy, not intractable, without status epilepticus ([place]) 03/18/2015  . Vitamin D deficiency 10/10/2014  . Tachycardia 06/06/2014  . Moderate persistent asthma without complication [id number]  . Hypothyroidism 05/19/2013    History reviewed. No pertinent surgical history.  OB History    No data available       Home Medications    Prior to Admission medications   Medication Sig Start Date End Date Taking? Authorizing Provider  albuterol (PROAIR HFA) 108 (90 Base) MCG/ACT inhaler 2 puffs every 4 hours as needed only  if your can't catch your breath 06/10/16  Yes Tanda Rockers, MD  budesonide-formoterol [facility]) 80-4.5 MCG/ACT inhaler Take 2 puffs first thing in am and then another 2 puffs about 12 hours later. 06/10/16  Yes Tanda Rockers, MD  Cholecalciferol (VITAMIN D) 2000 units tablet Take 1 tablet by  mouth daily. Reported on 12/31/2015   Yes Historical Provider, MD  levETIRAcetam (KEPPRA) 750 MG tablet Take 2 tablets (1,500 mg total) by mouth 2 (two) times daily. 1000 mg twice daily Patient taking differently: Take 1,500 mg by mouth 2 (two) times daily.  03/12/16  Yes Adam Telford Nab, DO  levothyroxine (SYNTHROID, LEVOTHROID) 150 MCG tablet Take 150 mcg by mouth daily before breakfast.   Yes Historical Provider, MD  albuterol (PROVENTIL) (2.5 MG/3ML) 0.083% nebulizer solution Take 3 mLs (2.5 mg total) by nebulization every 6 (six) hours as needed for wheezing or shortness of breath. 10/08/15   Hoyt Koch, MD  naproxen (NAPROSYN) 500 MG tablet Take 1 tablet (500 mg total) by mouth 2 (two) times daily. 07/28/16   Fredia Sorrow, MD    Family History Family History  Problem Relation Age of Onset  . Heart disease Mother 25    open heart surgery  . Heart disease Maternal Grandmother   . Epilepsy Maternal Grandfather     Social History Social History  Substance Use Topics  . Smoking status: Never Smoker  . Smokeless tobacco: Never Used  . Alcohol use No     Allergies   Patient has no known allergies.   Review of Systems Review of Systems  Constitutional: Negative for chills, diaphoresis, fatigue and fever.  HENT: Negative for congestion and trouble swallowing.   Eyes: Negative for visual disturbance.  Respiratory: Negative for shortness of breath.   Cardiovascular: Positive for chest pain.  Gastrointestinal: Negative for abdominal pain, nausea and vomiting.  Musculoskeletal: Positive for back pain and neck pain.  Skin: Negative for rash.  Neurological: Positive for light-headedness. Negative for syncope, facial asymmetry, speech difficulty, weakness, numbness and headaches.  Hematological: Does not bruise/bleed easily.  Psychiatric/Behavioral: Negative for confusion.     Physical Exam Updated Vital Signs BP 151/91 (BP Location: Left Arm)   Pulse 62   Temp 98.2 F  (36.8 C) (Oral)   Resp 18   Ht 5\' 9"  (1.753 m)   Wt 89.4 kg   LMP 07/24/2016   SpO2 100%   BMI 29.09 kg/m   Physical Exam  Constitutional: She is oriented to person, place, and time. She appears well-developed and well-nourished. No distress.  HENT:  Head: Normocephalic and atraumatic.  Mouth/Throat: Oropharynx is clear and moist.  Eyes: Conjunctivae and EOM are normal. Pupils are equal, round, and reactive to light.  Neck: Normal range of motion. Neck supple.  Cardiovascular: Normal rate, regular rhythm and normal heart sounds.   Pulmonary/Chest: Effort normal and breath sounds normal. No respiratory distress.  Abdominal: Soft. Bowel sounds are normal. There is no tenderness.  Musculoskeletal: Normal range of motion. She exhibits no edema.  Neurological: She is alert and oriented to person, place, and time. No cranial nerve deficit or sensory deficit. She exhibits normal muscle tone. Coordination normal.  Skin: Skin is warm and dry.  Nursing note and vitals reviewed.    ED Treatments / Results  Labs (all labs ordered are listed, but only abnormal results are displayed) Labs Reviewed  BASIC METABOLIC PANEL - Abnormal; Notable for the following:       Result Value   Calcium 8.8 (*)    All other components within normal limits  CBC - Abnormal; Notable for the following:    WBC 10.6 (*)    Hemoglobin 10.3 (*)    HCT 31.6 (*)    MCV 74.0 (*)    MCH 24.1 (*)    RDW 15.6 (*)    All other components within normal limits  I-STAT TROPOININ, ED    EKG  EKG Interpretation  Date/Time:  Tuesday July 28 2016 18:46:01 EST Ventricular Rate:  69 PR Interval:  118 QRS Duration: 128 QT Interval:  446 QTC Calculation: 477 R Axis:   14 Text Interpretation:  Normal sinus rhythm Wolff-Parkinson-White Abnormal ECG suggestive of WPW Confirmed by Braycen Burandt  MD, Izrael Peak [phone]) on 07/28/2016 7:03:41 PM       Radiology Dg Chest 2 View  Result Date: 07/28/2016 CLINICAL DATA:  LEFT  arm pain, LEFT facial numbness beginning today. History of asthma. EXAM: CHEST  2 VIEW COMPARISON:  Chest radiograph June 06, 2014 FINDINGS: Cardiomediastinal silhouette is normal. No pleural effusions or focal consolidations. Trachea projects midline and there is no pneumothorax. Soft tissue planes and included osseous structures are non-suspicious. IMPRESSION: Stable examination:  No acute cardiopulmonary process. Electronically Signed   By: Elon Alas M.D.   On: 07/28/2016 19:43    Procedures Procedures (including critical care time)  Medications Ordered in ED Medications - No data to display   Initial Impression / Assessment and Plan / ED Course  I have reviewed the triage vital signs and the nursing notes.  Pertinent labs & imaging results that were available during my care of the patient were reviewed by me and considered in my medical decision making (see chart for details).  Clinical  Course     Workup for the left shoulder arm pain and lateral chest pain without any acute findings. No evidence of concern for pulmonary embolus I she has saturations are normal chest x-ray negative for any acute findings. No tachycardia. Troponin negative basic labs normal. 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 For this recommend follow-up with cardiology.   In addition patient is on thyroid medicine. Did not check TSH. But having that followed up would be important by her primary care doctor or by cardiology. A sheet notified of this.   Final Clinical Impressions(s) / ED Diagnoses   Final diagnoses:  Chest pain, unspecified type  Left arm pain    New Prescriptions New Prescriptions   NAPROXEN (NAPROSYN) 500 MG TABLET    Take 1 tablet (500 mg total) by mouth 2 (two) times daily.     Fredia Sorrow, MD 07/28/16 2003

## 2016-07-30 ENCOUNTER — Encounter: Payer: Self-pay | Admitting: Nurse Practitioner

## 2016-07-30 ENCOUNTER — Ambulatory Visit (INDEPENDENT_AMBULATORY_CARE_PROVIDER_SITE_OTHER): Payer: Managed Care, Other (non HMO) | Admitting: Nurse Practitioner

## 2016-07-30 ENCOUNTER — Encounter: Payer: Self-pay | Admitting: Cardiology

## 2016-07-30 VITALS — BP 128/71 | HR 72 | Ht 69.0 in | Wt 196.0 lb

## 2016-07-30 DIAGNOSIS — R011 Cardiac murmur, unspecified: Secondary | ICD-10-CM

## 2016-07-30 DIAGNOSIS — R0789 Other chest pain: Secondary | ICD-10-CM | POA: Diagnosis not present

## 2016-07-30 DIAGNOSIS — I456 Pre-excitation syndrome: Secondary | ICD-10-CM | POA: Diagnosis not present

## 2016-07-30 DIAGNOSIS — E039 Hypothyroidism, unspecified: Secondary | ICD-10-CM | POA: Diagnosis not present

## 2016-07-30 NOTE — Patient Instructions (Signed)
Your physician recommends that you continue on your current medications as directed. Please refer to the Current Medication list given to you today.   Your physician has requested that you have an echocardiogram. Echocardiography is a painless test that uses sound waves to create images of your heart. It provides your doctor with information about the size and shape of your heart and how well your heart's chambers and valves are working. This procedure takes approximately one hour. There are no restrictions for this procedure.   Your physician has requested that you have an exercise tolerance test. For further information please visit [email or web address]. Please also follow instruction sheet, as given.  Follow up: Your physician wants you to follow-up in: [place]. You will receive a reminder letter in the mail two months in advance. If you don't receive a letter, please call our office IN MAY to schedule the follow-up appointment.

## 2016-07-30 NOTE — Progress Notes (Signed)
Cardiology Clinic Note   Patient Name: Rebecca Silva Date of Encounter: 07/30/2016  Primary Care Provider:  Hoyt Koch, MD Primary Cardiologist:  Rebecca Silva - will f/u with Rebecca Ross, MD   Patient Profile    46 y/o ? with a h/o heart murmur, asthma, Sz d/o, and hypothyroidism, who presents for eval r/t recent finding of WPW pattern.  Past Medical History    Past Medical History:  Diagnosis Date  . Asthma   . Heart murmur   . History of UTI   . Hypothyroidism   . Seasonal allergies   . Seizure disorder (Rebecca Silva)   . Urine incontinence   . Wolff-Parkinson-White pattern    No past surgical history on file.  Allergies  No Known Allergies  History of Present Illness    46 y/o ? with a h/o heart murmur, Sz d/o, hypothyroidism and asthma.  She lives in [place] with her husband.  They have 2 college aged children.  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  She does not routinely exercise.  She says that she had a stress test @ [facility] in ~ 2009.  She thought it was being done b/c of h/o heart murmur but she doesn't really remember.  She does not believe that she was having symptoms @ the time.   She was in her usual state of health until the morning of 1/9, when she was driving to work and noted left shoulder, arm, upper chest, and jaw discomfort.  There was some associated soreness.  She was also mildly LH.  No associated dyspnea, palpitations, n, v, or diaphoresis.  Ss continued @ work, though at a lower level, and so she saw her PCP.  ECG was non-acute but did show WPW pattern.  Arrangements were made for outpt stress testing and she was advised to present to the ED if Ss return/worsen.  Ss did worsen again and persisted throughout the day.  For that reason, she presented to the ED on the evening of 1/9.  There, left shoulder pain was somewhat reproducible with palpation.  Despite prolonged Ss, ECG was non-acute and troponin was normal.   She was advised to f/u with cardiology given WPW pattern.    Ms. Rebecca Silva says that her left shoulder and left upper chest is still very mildly tender.  She has taken OTC naproxen @ the recommendation of the ED.  She is not aware of any recent trauma.    From the standpoint of WPW, has occasionally noted a brief, skipped beat/flutter/palpitation, but denies any h/o prolonged tachyarrhythmias.  Except for the time that she passed out while watching her son have blood drawn, she has never had syncope.  She denies dyspnea, pnd, orthopnea, n, v, edema, weight gain, or early satiety.   Home Medications    Prior to Admission medications   Medication Sig Start Date End Date Taking? Authorizing Provider  albuterol (PROAIR HFA) 108 (90 Base) MCG/ACT inhaler 2 puffs every 4 hours as needed only  if your can't catch your breath 06/10/16  Yes Rebecca Rockers, MD  albuterol (PROVENTIL) (2.5 MG/3ML) 0.083% nebulizer solution Take 3 mLs (2.5 mg total) by nebulization every 6 (six) hours as needed for wheezing or shortness of breath. 10/08/15  Yes Rebecca Koch, MD  budesonide-formoterol [facility]) 80-4.5 MCG/ACT inhaler Take 2 puffs first thing in am and then another 2 puffs about 12 hours later. 06/10/16  Yes Rebecca Deem  Wert, MD  Cholecalciferol (VITAMIN D) 2000 units tablet Take 1 tablet by mouth daily. Reported on 12/31/2015   Yes Historical Provider, MD  levETIRAcetam (KEPPRA) 750 MG tablet Take 2 tablets (1,500 mg total) by mouth 2 (two) times daily. 1000 mg twice daily Patient taking differently: Take 1,500 mg by mouth 2 (two) times daily.  03/12/16  Yes Rebecca Telford Nab, DO  levothyroxine (SYNTHROID, LEVOTHROID) 150 MCG tablet Take 150 mcg by mouth daily before breakfast.   Yes Historical Provider, MD  naproxen (NAPROSYN) 500 MG tablet Take 1 tablet (500 mg total) by mouth 2 (two) times daily. 07/28/16  Yes Rebecca Sorrow, MD    Family History    Family History  Problem Relation Age of Onset  . Heart  disease Mother 53    open heart surgery  . Heart disease Maternal Grandmother   . Epilepsy Maternal Grandfather     Social History    Social History   Social History  . Marital status: Married    Spouse name: N/A  . Number of children: N/A  . 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 Smoking status: Never Smoker  . Smokeless tobacco: Never Used  . Alcohol use No  . Drug use: No  . Sexual activity: Yes    Partners: Male   Other Topics Concern  . 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  Cardiovascular:  +++ left shoulder/arm/jaw/chest pain and tenderness - now improved, no dyspnea on exertion, edema, orthopnea, palpitations, paroxysmal nocturnal dyspnea. Dermatological: No rash, lesions/masses Respiratory: No cough, dyspnea Urologic: No hematuria, dysuria Abdominal:   No nausea, vomiting, diarrhea, bright red blood per rectum, melena, or hematemesis Neurologic:  No visual changes, wkns, changes in mental status. All other systems reviewed and are otherwise negative except as noted above.  Physical Exam    VS:  BP 128/71   Pulse 72   Ht 5\' 9"  (1.753 m)   Wt 196 lb (88.9 kg)   LMP 07/24/2016   BMI 28.94 kg/m  , BMI Body mass index is 28.94 kg/m. GEN: Well nourished, well developed, in no acute distress.  HEENT: normal.  Neck: Supple, no JVD, carotid bruits, or masses. Cardiac: RRR, 2/6 SEM @ RUSB, rubs, or gallops. No clubbing, cyanosis, edema.  Radials/DP/PT 2+ and equal bilaterally.  Respiratory:  Respirations regular and unlabored, clear to auscultation bilaterally. GI: Soft, nontender, nondistended, BS + x 4. MS: no deformity or atrophy. Skin: warm and dry, no rash. Neuro:   Strength and sensation are intact. Psych: Normal affect.  Accessory Clinical Findings    ECG - RSR, 69, WPW pattern.  No acute st/t changes.  Lab Results  Component Value Date   CREATININE 0.73 07/28/2016   BUN 11 07/28/2016   NA 138 07/28/2016   K 3.9 07/28/2016   CL 106 07/28/2016   CO2 27 07/28/2016   Lab Results  Component Value Date   WBC 10.6 (H) 07/28/2016   HGB 10.3 (L) 07/28/2016   HCT 31.6 (L) 07/28/2016   MCV 74.0 (L) 07/28/2016   PLT 271 07/28/2016   Lab Results  Component Value Date   TSH 1.91 07/01/2016    Assessment & Plan   1.  WPW Pattern:  Pt was evaluated in the ED on 1/9 for left arm/shoulder/chest/jaw discomfort and tenderness.  Trop was nl, ECG w/o acute ST/T changes, however WPW pattern was noted. Delta waves can also be seen on ECG from earlier that day in PCP office and also dating back to 05/2014 (hospitalized @ that time for asthma flare).  She denies any prior h/o prolonged tachyarrhythmias or syncope.  She is unaware of any prior dx of WPW pattern, but it is notable that she underwent ETT @ [facility] in ~ 2009.  She was under the impression that that was performed b/c of murmur.  I discussed her case with Dr. Stanford Breed today.  I will arrange for repeat ETT and also echo.  Provided that testing is nl, she can continue to be managed conservatively.  I advised that if she were to develop worsening palpitations/tachyarrhthmias or syncope, we would refer her to EP @ that point.  2.  Systolic murmur:  Likely an innocent murmur.  Obtaining echo in setting of above.  3.  Atypical chest/arm pain:  She had a prolonged episode of left shoulder/arm/chest/jaw pain on 1/9, which was associated with tenderness.  She is still mildly tender.  OTC nsaids have helped.  Doubt cardiac in origin.  Obtaining ETT in setting of WPW - can assess for ST changes @ that time.  4.  Hypothyroidism:  TFT's recently nl.  5.  Sz D/o:  Quiescent on keppra.  6.  Asthma:  No  active wheezing.  Followed by pulmonology.  On inhaler therapy.  7.  Disposition: f/u echo/ETT. Provided that these are nl, plan to f/u with Dr. Stanford Breed in 6 mos.  Murray Hodgkins, NP 07/30/2016, 2:32 PM

## 2016-08-06 ENCOUNTER — Other Ambulatory Visit (HOSPITAL_COMMUNITY): Payer: Managed Care, Other (non HMO)

## 2016-08-07 ENCOUNTER — Telehealth (HOSPITAL_COMMUNITY): Payer: Self-pay

## 2016-08-07 NOTE — Telephone Encounter (Signed)
Encounter complete. 

## 2016-08-12 ENCOUNTER — Ambulatory Visit (HOSPITAL_COMMUNITY)
Admission: RE | Admit: 2016-08-12 | Discharge: 2016-08-12 | Disposition: A | Discharge status: Home / Self Care | Payer: Managed Care, Other (non HMO) | Source: Ambulatory Visit | Attending: Cardiovascular Disease | Admitting: Cardiovascular Disease

## 2016-08-12 DIAGNOSIS — I456 Pre-excitation syndrome: Secondary | ICD-10-CM

## 2016-08-12 LAB — EXERCISE TOLERANCE TEST
CHL CUP MPHR: 173 {beats}/min
CHL CUP RESTING HR STRESS: 70 {beats}/min
CSEPHR: 98 %
CSEPPHR: 171 {beats}/min
Estimated workload: 10.4 METS
Exercise duration (min): 9 min
Exercise duration (sec): 15 s
RPE: 17

## 2016-08-17 ENCOUNTER — Other Ambulatory Visit: Payer: Self-pay | Admitting: Neurology

## 2016-08-20 ENCOUNTER — Ambulatory Visit (HOSPITAL_COMMUNITY): Payer: Managed Care, Other (non HMO) | Attending: Cardiovascular Disease

## 2016-08-20 ENCOUNTER — Other Ambulatory Visit: Payer: Self-pay

## 2016-08-20 DIAGNOSIS — I456 Pre-excitation syndrome: Secondary | ICD-10-CM | POA: Insufficient documentation

## 2016-08-28 ENCOUNTER — Ambulatory Visit: Payer: Managed Care, Other (non HMO) | Admitting: Family Medicine

## 2016-08-28 NOTE — Progress Notes (Deleted)
  HPI:  Rebecca Silva is a pleasant  ROS: See pertinent positives and negatives per HPI.  Past Medical History:  Diagnosis Date  . Asthma   . Heart murmur   . History of UTI   . Hypothyroidism   . Seasonal allergies   . Seizure disorder ([place])   . Urine incontinence   . Wolff-Parkinson-White pattern     No past surgical history on file.  Family History  Problem Relation Age of Onset  . Heart disease Mother 60    open heart surgery  . Heart disease Maternal Grandmother   . Epilepsy Maternal Grandfather     Social History   Social History  . Marital status: Married    Spouse name: N/A  . Number of children: N/A  . 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 Smoking status: Never Smoker  . Smokeless tobacco: Never Used  . Alcohol use No  . Drug use: No  . Sexual activity: Yes    Partners: Male   Other Topics Concern  . 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  albuterol (PROAIR HFA) 108 (90 Base) MCG/ACT inhaler, 2 puffs every 4 hours as needed only  if your can't catch your breath, Disp: 1 Inhaler, Rfl: 1 .  albuterol (PROVENTIL) (2.5 MG/3ML) 0.083% nebulizer solution, Take 3 mLs (2.5 mg total) by nebulization every 6 (six) hours as needed for wheezing or shortness of breath., Disp: 75 mL, Rfl: 0 .  budesonide-formoterol (SYMBICORT) 80-4.5 MCG/ACT inhaler, Take 2 puffs first thing in am and then another 2 puffs about 12 hours later., Disp: 1 Inhaler, Rfl: 11 .  Cholecalciferol (VITAMIN D) 2000 units tablet, Take 1 tablet by mouth daily. Reported on 12/31/2015, Disp: , Rfl:  .  levETIRAcetam (KEPPRA) 750 MG tablet, TAKE (2) TABLETS BY MOUTH TWICE DAILY., Disp: 120 tablet, Rfl: 5 .  levothyroxine (SYNTHROID, LEVOTHROID) 150  MCG tablet, Take 150 mcg by mouth daily before breakfast., Disp: , Rfl:  .  naproxen (NAPROSYN) 500 MG tablet, Take 1 tablet (500 mg total) by mouth 2 (two) times daily., Disp: 14 tablet, Rfl: 0  EXAM:  There were no vitals filed for this visit.  There is no height or weight on file to calculate BMI.  GENERAL: vitals reviewed and listed above, alert, oriented, appears well hydrated and in no acute distress  HEENT: atraumatic, conjunttiva clear, no obvious abnormalities on inspection of external nose and ears  NECK: no obvious masses on inspection  LUNGS: clear to auscultation bilaterally, no wheezes, rales or rhonchi, good air movement  CV: HRRR, no peripheral edema  MS: moves all extremities without noticeable abnormality  PSYCH: pleasant and cooperative, no obvious depression or anxiety  ASSESSMENT AND PLAN:  Discussed the following assessment and plan:  No diagnosis found.  -Patient advised to return or notify a doctor immediately if symptoms worsen or persist or new concerns arise.  There are no Patient Instructions on file for this visit.  Colin Benton R., DO

## 2016-09-22 ENCOUNTER — Other Ambulatory Visit: Payer: Self-pay | Admitting: Internal Medicine

## 2016-09-22 DIAGNOSIS — J4540 Moderate persistent asthma, uncomplicated: Secondary | ICD-10-CM

## 2016-10-02 ENCOUNTER — Encounter: Payer: Self-pay | Admitting: Podiatry

## 2016-10-02 ENCOUNTER — Ambulatory Visit (INDEPENDENT_AMBULATORY_CARE_PROVIDER_SITE_OTHER): Payer: Managed Care, Other (non HMO) | Admitting: Podiatry

## 2016-10-02 ENCOUNTER — Ambulatory Visit (INDEPENDENT_AMBULATORY_CARE_PROVIDER_SITE_OTHER): Payer: Managed Care, Other (non HMO)

## 2016-10-02 VITALS — BP 126/70 | HR 67 | Resp 16 | Ht 69.0 in | Wt 193.0 lb

## 2016-10-02 DIAGNOSIS — D169 Benign neoplasm of bone and articular cartilage, unspecified: Secondary | ICD-10-CM | POA: Diagnosis not present

## 2016-10-02 DIAGNOSIS — M79672 Pain in left foot: Secondary | ICD-10-CM | POA: Diagnosis not present

## 2016-10-02 DIAGNOSIS — M2041 Other hammer toe(s) (acquired), right foot: Secondary | ICD-10-CM

## 2016-10-02 DIAGNOSIS — L84 Corns and callosities: Secondary | ICD-10-CM

## 2016-10-02 NOTE — Progress Notes (Signed)
   Subjective:    Patient ID: Rebecca Silva, female    DOB: 1971-03-03, 46 y.o.   MRN: [id number]  HPI  Chief Complaint  Patient presents with  . Toe Pain    Left; 3rd and 4th toes x 1 month. Pt stated "it hurts to wear certain shoes"   . Foot Pain    Left; medial side; beneath great toes x 1 month. Pt stated "unsure if this is caused by walking more on the medial side of foot due to the toe pain"   . Foot Pain    Left; bottom of heel x 1 week. Pt stated that the pain has improved now        Review of Systems     Objective:   Physical Exam        Assessment & Plan:

## 2016-10-04 NOTE — Progress Notes (Signed)
Subjective:     Patient ID: Rebecca Silva, female   DOB: 09/15/1970, 46 y.o.   MRN: [id number]  HPI patient presents with very painful fourth and fifth toe left foot and feels a sharp pin type sensation. States it's been present for several months and getting worse   Review of Systems  All other systems reviewed and are negative.      Objective:   Physical Exam  Constitutional: She is oriented to person, place, and time.  Cardiovascular: Intact distal pulses.   Musculoskeletal: Normal range of motion.  Neurological: She is oriented to person, place, and time.  Skin: Skin is warm.  Nursing note and vitals reviewed.  neurovascular status intact muscle strength adequate range of motion within normal limits with patient found to have very painful left fourth and fifth toes left with keratotic tissue on the fifth digit rotation of the toe and pressing between the 2 toes. 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    Plan:     H&P x-ray reviewed and discussed condition and also discussed long-term surgical correction. At this point I debrided lesions applied padding and reappoint 6 weeks for consideration of surgery for this particular condition  X-rays indicate rotation of the fifth digit left pressing against the fourth digit left with lesion formation

## 2016-11-13 ENCOUNTER — Ambulatory Visit: Payer: Managed Care, Other (non HMO) | Admitting: Podiatry

## 2016-11-19 ENCOUNTER — Ambulatory Visit: Payer: Managed Care, Other (non HMO) | Admitting: Podiatry

## 2016-11-23 ENCOUNTER — Ambulatory Visit (INDEPENDENT_AMBULATORY_CARE_PROVIDER_SITE_OTHER): Payer: Managed Care, Other (non HMO) | Admitting: Internal Medicine

## 2016-11-23 ENCOUNTER — Encounter: Payer: Self-pay | Admitting: Internal Medicine

## 2016-11-23 ENCOUNTER — Other Ambulatory Visit: Payer: Managed Care, Other (non HMO)

## 2016-11-23 VITALS — BP 134/80 | HR 84 | Temp 98.2°F | Resp 12 | Ht 69.0 in | Wt 198.0 lb

## 2016-11-23 DIAGNOSIS — E063 Autoimmune thyroiditis: Secondary | ICD-10-CM | POA: Diagnosis not present

## 2016-11-23 DIAGNOSIS — Z0000 Encounter for general adult medical examination without abnormal findings: Secondary | ICD-10-CM

## 2016-11-23 DIAGNOSIS — E038 Other specified hypothyroidism: Secondary | ICD-10-CM

## 2016-11-23 DIAGNOSIS — J4540 Moderate persistent asthma, uncomplicated: Secondary | ICD-10-CM | POA: Diagnosis not present

## 2016-11-23 NOTE — Progress Notes (Signed)
   Subjective:    Patient ID: Rebecca Silva, female    DOB: 08/26/1970, 46 y.o.   MRN: [id number]  HPI The patient is a 46 YO female coming in for wellness. No new concerns.  PMH, [facility], social history reviewed and updated.   Review of Systems  Constitutional: Negative.   HENT: Negative.   Eyes: Negative.   Respiratory: Negative for cough, chest tightness and shortness of breath.   Cardiovascular: Negative for chest pain, palpitations and leg swelling.  Gastrointestinal: Negative for abdominal distention, abdominal pain, constipation, diarrhea, nausea and vomiting.  Musculoskeletal: Positive for arthralgias. Negative for back pain, gait problem, joint swelling and neck pain.  Skin: Negative.   Neurological: Negative.   Psychiatric/Behavioral: Negative.       Objective:   Physical Exam  Constitutional: She is oriented to person, place, and time. She appears well-developed and well-nourished.  HENT:  Head: Normocephalic and atraumatic.  Eyes: EOM are normal.  Neck: Normal range of motion.  Cardiovascular: Normal rate and regular rhythm.   Pulmonary/Chest: Effort normal and breath sounds normal. No respiratory distress. She has no wheezes. She has no rales.  Abdominal: Soft. Bowel sounds are normal. She exhibits no distension. There is no tenderness. There is no rebound.  Musculoskeletal: She exhibits no edema.  Neurological: She is alert and oriented to person, place, and time. Coordination normal.  Skin: Skin is warm and dry.  Psychiatric: She has a normal mood and affect.   Vitals:   11/23/16 1316  BP: 134/80  Pulse: 84  Resp: 12  Temp: 98.2 F (36.8 C)  TempSrc: Oral  SpO2: 100%  Weight: 198 lb (89.8 kg)  Height: 5\' 9"  (1.753 m)      Assessment & Plan:

## 2016-11-23 NOTE — Progress Notes (Signed)
Pre visit review using our clinic review tool, if applicable. No additional management support is needed unless otherwise documented below in the visit note. 

## 2016-11-23 NOTE — Assessment & Plan Note (Signed)
Since starting symbicort less albuterol usage and no flare today.

## 2016-11-23 NOTE — Assessment & Plan Note (Signed)
Taking synthroid 150 mcg daily and checking TSH and free T4 and adjust as needed.

## 2016-11-23 NOTE — Patient Instructions (Signed)
We will check the labs today and call you back with the results.   Health Maintenance, Female Adopting a healthy lifestyle and getting preventive care can go a long way to promote health and wellness. Talk with your health care provider about what schedule of regular examinations is right for you. This is a good chance for you to check in with your provider about disease prevention and staying healthy. In between checkups, there are plenty of things you can do on your own. Experts have done a lot of research about which lifestyle changes and preventive measures are most likely to keep you healthy. Ask your health care provider for more information. Weight and diet Eat a healthy diet  Be sure to include plenty of vegetables, fruits, low-fat dairy products, and lean protein.  Do not eat a lot of foods high in solid fats, added sugars, or salt.  Get regular exercise. This is one of the most important things you can do for your health.  Most adults should exercise for at least 150 minutes each week. The exercise should increase your heart rate and make you sweat (moderate-intensity exercise).  Most adults should also do strengthening exercises at least twice a week. This is in addition to the moderate-intensity exercise. Maintain a healthy weight  Body mass index (BMI) is a measurement that can be used to identify possible weight problems. It estimates body fat based on height and weight. Your health care provider can help determine your BMI and help you achieve or maintain a healthy weight.  For females 61 years of age and older:  A BMI below 18.5 is considered underweight.  A BMI of 18.5 to 24.9 is normal.  A BMI of 25 to 29.9 is considered overweight.  A BMI of 30 and above is considered obese. Watch levels of cholesterol and blood lipids  You should start having your blood tested for lipids and cholesterol at 48 years of age, then have this test every 5 years.  You may need to have  your cholesterol levels checked more often if:  Your lipid or cholesterol levels are high.  You are older than 49 years of age.  You are at high risk for heart disease. Cancer screening Lung Cancer  Lung cancer screening is recommended for adults 38-12 years old who are at high risk for lung cancer because of a history of smoking.  A yearly low-dose CT scan of the lungs is recommended for people who:  Currently smoke.  Have quit within the past 15 years.  Have at least a 30-pack-year history of smoking. A pack year is smoking an average of one pack of cigarettes a day for 1 year.  Yearly screening should continue until it has been 15 years since you quit.  Yearly screening should stop if you develop a health problem that would prevent you from having lung cancer treatment. Breast Cancer  Practice breast self-awareness. This means understanding how your breasts normally appear and feel.  It also means doing regular breast self-exams. Let your health care provider know about any changes, no matter how small.  If you are in your 20s or 30s, you should have a clinical breast exam (CBE) by a health care provider every 1-3 years as part of a regular health exam.  If you are 71 or older, have a CBE every year. Also consider having a breast X-ray (mammogram) every year.  If you have a family history of breast cancer, talk to your health care  provider about genetic screening.  If you are at high risk for breast cancer, talk to your health care provider about having an MRI and a mammogram every year.  Breast cancer gene (BRCA) assessment is recommended for women who have family members with BRCA-related cancers. BRCA-related cancers include:  Breast.  Ovarian.  Tubal.  Peritoneal cancers.  Results of the assessment will determine the need for genetic counseling and BRCA1 and BRCA2 testing. Cervical Cancer  Your health care provider may recommend that you be screened regularly  for cancer of the pelvic organs (ovaries, uterus, and vagina). This screening involves a pelvic examination, including checking for microscopic changes to the surface of your cervix (Pap test). You may be encouraged to have this screening done every 3 years, beginning at age 21.  For women ages 30-65, health care providers may recommend pelvic exams and Pap testing every 3 years, or they may recommend the Pap and pelvic exam, combined with testing for human papilloma virus (HPV), every 5 years. Some types of HPV increase your risk of cervical cancer. Testing for HPV may also be done on women of any age with unclear Pap test results.  Other health care providers may not recommend any screening for nonpregnant women who are considered low risk for pelvic cancer and who do not have symptoms. Ask your health care provider if a screening pelvic exam is right for you.  If you have had past treatment for cervical cancer or a condition that could lead to cancer, you need Pap tests and screening for cancer for at least 20 years after your treatment. If Pap tests have been discontinued, your risk factors (such as having a new sexual partner) need to be reassessed to determine if screening should resume. Some women have medical problems that increase the chance of getting cervical cancer. In these cases, your health care provider may recommend more frequent screening and Pap tests. Colorectal Cancer  This type of cancer can be detected and often prevented.  Routine colorectal cancer screening usually begins at 48 years of age and continues through 48 years of age.  Your health care provider may recommend screening at an earlier age if you have risk factors for colon cancer.  Your health care provider may also recommend using home test kits to check for hidden blood in the stool.  A small camera at the end of a tube can be used to examine your colon directly (sigmoidoscopy or colonoscopy). This is done to check  for the earliest forms of colorectal cancer.  Routine screening usually begins at age 50.  Direct examination of the colon should be repeated every 5-10 years through 48 years of age. However, you may need to be screened more often if early forms of precancerous polyps or small growths are found. Skin Cancer  Check your skin from head to toe regularly.  Tell your health care provider about any new moles or changes in moles, especially if there is a change in a mole's shape or color.  Also tell your health care provider if you have a mole that is larger than the size of a pencil eraser.  Always use sunscreen. Apply sunscreen liberally and repeatedly throughout the day.  Protect yourself by wearing long sleeves, pants, a wide-brimmed hat, and sunglasses whenever you are outside. Heart disease, diabetes, and high blood pressure  High blood pressure causes heart disease and increases the risk of stroke. High blood pressure is more likely to develop in:  People who   have blood pressure in the high end of the normal range (130-139/85-89 mm Hg).  People who are overweight or obese.  People who are African American.  If you are 66-44 years of age, have your blood pressure checked every 3-5 years. If you are 81 years of age or older, have your blood pressure checked every year. You should have your blood pressure measured twice-once when you are at a hospital or clinic, and once when you are not at a hospital or clinic. Record the average of the two measurements. To check your blood pressure when you are not at a hospital or clinic, you can use:  An automated blood pressure machine at a pharmacy.  A home blood pressure monitor.  If you are between 53 years and 57 years old, ask your health care provider if you should take aspirin to prevent strokes.  Have regular diabetes screenings. This involves taking a blood sample to check your fasting blood sugar level.  If you are at a normal weight  and have a low risk for diabetes, have this test once every three years after 48 years of age.  If you are overweight and have a high risk for diabetes, consider being tested at a younger age or more often. Preventing infection Hepatitis B  If you have a higher risk for hepatitis B, you should be screened for this virus. You are considered at high risk for hepatitis B if:  You were born in a country where hepatitis B is common. Ask your health care provider which countries are considered high risk.  Your parents were born in a high-risk country, and you have not been immunized against hepatitis B (hepatitis B vaccine).  You have HIV or AIDS.  You use needles to inject street drugs.  You live with someone who has hepatitis B.  You have had sex with someone who has hepatitis B.  You get hemodialysis treatment.  You take certain medicines for conditions, including cancer, organ transplantation, and autoimmune conditions. Hepatitis C  Blood testing is recommended for:  Everyone born from [birth year] through 1967.  Anyone with known risk factors for hepatitis C. Sexually transmitted infections (STIs)  You should be screened for sexually transmitted infections (STIs) including gonorrhea and chlamydia if:  You are sexually active and are younger than 48 years of age.  You are older than 48 years of age and your health care provider tells you that you are at risk for this type of infection.  Your sexual activity has changed since you were last screened and you are at an increased risk for chlamydia or gonorrhea. Ask your health care provider if you are at risk.  If you do not have HIV, but are at risk, it may be recommended that you take a prescription medicine daily to prevent HIV infection. This is called pre-exposure prophylaxis (PrEP). You are considered at risk if:  You are sexually active and do not regularly use condoms or know the HIV status of your partner(s).  You take drugs by  injection.  You are sexually active with a partner who has HIV. Talk with your health care provider about whether you are at high risk of being infected with HIV. If you choose to begin PrEP, you should first be tested for HIV. You should then be tested every 3 months for as long as you are taking PrEP. Pregnancy  If you are premenopausal and you may become pregnant, ask your health care provider about preconception counseling.  If you may become pregnant, take 400 to 800 micrograms (mcg) of folic acid every day.  If you want to prevent pregnancy, talk to your health care provider about birth control (contraception). Osteoporosis and menopause  Osteoporosis is a disease in which the bones lose minerals and strength with aging. This can result in serious bone fractures. Your risk for osteoporosis can be identified using a bone density scan.  If you are 37 years of age or older, or if you are at risk for osteoporosis and fractures, ask your health care provider if you should be screened.  Ask your health care provider whether you should take a calcium or vitamin D supplement to lower your risk for osteoporosis.  Menopause may have certain physical symptoms and risks.  Hormone replacement therapy may reduce some of these symptoms and risks. Talk to your health care provider about whether hormone replacement therapy is right for you. Follow these instructions at home:  Schedule regular health, dental, and eye exams.  Stay current with your immunizations.  Do not use any tobacco products including cigarettes, chewing tobacco, or electronic cigarettes.  If you are pregnant, do not drink alcohol.  If you are breastfeeding, limit how much and how often you drink alcohol.  Limit alcohol intake to no more than 1 drink per day for nonpregnant women. One drink equals 12 ounces of beer, 5 ounces of wine, or 1 ounces of hard liquor.  Do not use street drugs.  Do not share needles.  Ask  your health care provider for help if you need support or information about quitting drugs.  Tell your health care provider if you often feel depressed.  Tell your health care provider if you have ever been abused or do not feel safe at home. This information is not intended to replace advice given to you by your health care provider. Make sure you discuss any questions you have with your health care provider. Document Released: 01/19/2011 Document Revised: 12/12/2015 Document Reviewed: 04/09/2015 Elsevier Interactive Patient Education  2017 [employer].

## 2016-11-23 NOTE — Assessment & Plan Note (Signed)
Tdap and pap smear and mammogram up to date. Counseled on sun safety and mole surveillance. Counseled on the dangers of distracted driving. Declines HIV screening. Given screening recommendations.

## 2016-11-25 ENCOUNTER — Ambulatory Visit: Payer: Managed Care, Other (non HMO) | Admitting: Podiatry

## 2016-11-26 ENCOUNTER — Other Ambulatory Visit (INDEPENDENT_AMBULATORY_CARE_PROVIDER_SITE_OTHER): Payer: Managed Care, Other (non HMO)

## 2016-11-26 DIAGNOSIS — Z0000 Encounter for general adult medical examination without abnormal findings: Secondary | ICD-10-CM | POA: Diagnosis not present

## 2016-11-26 LAB — LIPID PANEL
CHOLESTEROL: 196 mg/dL (ref 0–200)
HDL: 39.9 mg/dL (ref >39.00)
LDL Cholesterol: 133 mg/dL — ABNORMAL HIGH (ref 0–99)
NONHDL: 156.09
Total CHOL/HDL Ratio: 5
Triglycerides: 116 mg/dL (ref 0.0–149.0)
VLDL: 23.2 mg/dL (ref 0.0–40.0)

## 2016-11-26 LAB — CBC
HCT: 34.7 % — ABNORMAL LOW (ref 36.0–46.0)
HEMOGLOBIN: 11.1 g/dL — AB (ref 12.0–15.0)
MCHC: 31.9 g/dL (ref 30.0–36.0)
MCV: 73.4 fl — ABNORMAL LOW (ref 78.0–100.0)
Platelets: 299 10*3/uL (ref 150.0–400.0)
RBC: 4.73 Mil/uL (ref 3.87–5.11)
RDW: 16.7 % — AB (ref 11.5–15.5)
WBC: 8.6 10*3/uL (ref 4.0–10.5)

## 2016-11-26 LAB — COMPREHENSIVE METABOLIC PANEL
ALT: 15 U/L (ref 0–35)
AST: 14 U/L (ref 0–37)
Albumin: 4.3 g/dL (ref 3.5–5.2)
Alkaline Phosphatase: 52 U/L (ref 39–117)
BILIRUBIN TOTAL: 0.5 mg/dL (ref 0.2–1.2)
BUN: 12 mg/dL (ref 6–23)
CO2: 24 meq/L (ref 19–32)
CREATININE: 0.83 mg/dL (ref 0.40–1.20)
Calcium: 8.8 mg/dL (ref 8.4–10.5)
Chloride: 107 mEq/L (ref 96–112)
GFR: 77.92 mL/min (ref >60.00)
GLUCOSE: 98 mg/dL (ref 70–99)
Potassium: 4.2 mEq/L (ref 3.5–5.1)
Sodium: 139 mEq/L (ref 135–145)
Total Protein: 7.1 g/dL (ref 6.0–8.3)

## 2016-11-26 LAB — HEMOGLOBIN A1C: HEMOGLOBIN A1C: 5.5 % (ref 4.6–6.5)

## 2016-11-26 LAB — VITAMIN D 25 HYDROXY (VIT D DEFICIENCY, FRACTURES): VITD: 19.59 ng/mL — ABNORMAL LOW (ref 30.00–100.00)

## 2016-11-26 LAB — VITAMIN B12: VITAMIN B 12: 277 pg/mL (ref 211–911)

## 2016-11-26 LAB — T4, FREE: FREE T4: 1.01 ng/dL (ref 0.60–1.60)

## 2016-11-26 LAB — TSH: TSH: 3.22 u[IU]/mL (ref 0.35–4.50)

## 2016-12-17 ENCOUNTER — Ambulatory Visit (INDEPENDENT_AMBULATORY_CARE_PROVIDER_SITE_OTHER): Payer: Managed Care, Other (non HMO) | Admitting: Internal Medicine

## 2016-12-17 ENCOUNTER — Encounter: Payer: Self-pay | Admitting: Internal Medicine

## 2016-12-17 DIAGNOSIS — J301 Allergic rhinitis due to pollen: Secondary | ICD-10-CM | POA: Diagnosis not present

## 2016-12-17 MED ORDER — MONTELUKAST SODIUM 10 MG PO TABS: 10.0000 mg | ORAL_TABLET | Freq: Every day | ORAL | 3 refills | Status: DC

## 2016-12-17 NOTE — Progress Notes (Signed)
   Subjective:    Patient ID: Rebecca Silva, female    DOB: 1971-03-06, 46 y.o.   MRN: [id number]  HPI The patient is a 46 YO female coming in for ear pain and congestion for the last 3-4 days. Denies fevers or chills. Mild cough which is productive but without SOB. She has not used her albuterol inhaler more often the last several days. She has some mild sinus congestion. Denies headaches or migraines. She has only tried mucinex for the symptoms which has helped to break up the congestion in her lungs some. Still taking her symbicort regularly. No change to her activities.   Review of Systems  Constitutional: Negative for activity change, appetite change, chills, fatigue, fever and unexpected weight change.  HENT: Positive for congestion, ear pain and rhinorrhea. Negative for dental problem, drooling, ear discharge, postnasal drip, sinus pain, sinus pressure, sore throat, tinnitus and trouble swallowing.   Eyes: Negative.   Respiratory: Positive for cough. Negative for chest tightness, shortness of breath and wheezing.   Cardiovascular: Negative.   Gastrointestinal: Negative.   Musculoskeletal: Negative.   Neurological: Negative.       Objective:   Physical Exam  Constitutional: She is oriented to person, place, and time. She appears well-developed and well-nourished.  HENT:  Head: Normocephalic and atraumatic.  Right Ear: External ear normal.  Left Ear: External ear normal.  Nose: Nose normal.  Oropharynx with redness and clear drainage. No sinus pressure  Eyes: EOM are normal.  Neck: Normal range of motion. No JVD present.  Cardiovascular: Normal rate and regular rhythm.   Pulmonary/Chest: Effort normal and breath sounds normal. No respiratory distress. She has no wheezes. She has no rales.  Abdominal: Soft.  Lymphadenopathy:    She has no cervical adenopathy.  Neurological: She is alert and oriented to person, place, and time.  Skin: Skin is warm and dry.   Vitals:   12/17/16 0950  BP: (!) 142/80  Pulse: 97  Resp: 14  Temp: 98.6 F (37 C)  TempSrc: Oral  SpO2: 99%  Weight: 195 lb (88.5 kg)  Height: 5\' 9"  (1.753 m)      Assessment & Plan:

## 2016-12-17 NOTE — Patient Instructions (Signed)
We have called in singulair which is the allergy medicine. Take 1 pill daily.   If you are not feeling any better Monday or Tuesday call us back and we can call in another medicine for you.   Allergic Rhinitis Allergic rhinitis is when the mucous membranes in the nose respond to allergens. Allergens are particles in the air that cause your body to have an allergic reaction. This causes you to release allergic antibodies. Through a chain of events, these eventually cause you to release histamine into the blood stream. Although meant to protect the body, it is this release of histamine that causes your discomfort, such as frequent sneezing, congestion, and an itchy, runny nose. What are the causes? Seasonal allergic rhinitis (hay fever) is caused by pollen allergens that may come from grasses, trees, and weeds. Year-round allergic rhinitis (perennial allergic rhinitis) is caused by allergens such as house dust mites, pet dander, and mold spores. What are the signs or symptoms?  Nasal stuffiness (congestion).  Itchy, runny nose with sneezing and tearing of the eyes. How is this diagnosed? Your health care provider can help you determine the allergen or allergens that trigger your symptoms. If you and your health care provider are unable to determine the allergen, skin or blood testing may be used. Your health care provider will diagnose your condition after taking your health history and performing a physical exam. Your health care provider may assess you for other related conditions, such as asthma, pink eye, or an ear infection. How is this treated? Allergic rhinitis does not have a cure, but it can be controlled by:  Medicines that block allergy symptoms. These may include allergy shots, nasal sprays, and oral antihistamines.  Avoiding the allergen.  Hay fever may often be treated with antihistamines in pill or nasal spray forms. Antihistamines block the effects of histamine. There are  over-the-counter medicines that may help with nasal congestion and swelling around the eyes. Check with your health care provider before taking or giving this medicine. If avoiding the allergen or the medicine prescribed do not work, there are many new medicines your health care provider can prescribe. Stronger medicine may be used if initial measures are ineffective. Desensitizing injections can be used if medicine and avoidance does not work. Desensitization is when a patient is given ongoing shots until the body becomes less sensitive to the allergen. Make sure you follow up with your health care provider if problems continue. Follow these instructions at home: It is not possible to completely avoid allergens, but you can reduce your symptoms by taking steps to limit your exposure to them. It helps to know exactly what you are allergic to so that you can avoid your specific triggers. Contact a health care provider if:  You have a fever.  You develop a cough that does not stop easily (persistent).  You have shortness of breath.  You start wheezing.  Symptoms interfere with normal daily activities. This information is not intended to replace advice given to you by your health care provider. Make sure you discuss any questions you have with your health care provider. Document Released: 03/31/2001 Document Revised: 03/06/2016 Document Reviewed: 03/13/2013 Elsevier Interactive Patient Education  2017 [employer].

## 2016-12-18 DIAGNOSIS — J309 Allergic rhinitis, unspecified: Secondary | ICD-10-CM | POA: Insufficient documentation

## 2016-12-18 NOTE — Assessment & Plan Note (Signed)
Rx for singulair to help with her allergies, no sign of an asthma flare. She is not taking any allergy medicine and advised her to start zyrtec as well with the singulair for relief.

## 2016-12-22 ENCOUNTER — Encounter: Payer: Self-pay | Admitting: Internal Medicine

## 2016-12-28 ENCOUNTER — Encounter: Payer: Self-pay | Admitting: Neurology

## 2016-12-28 ENCOUNTER — Ambulatory Visit (INDEPENDENT_AMBULATORY_CARE_PROVIDER_SITE_OTHER): Payer: Managed Care, Other (non HMO) | Admitting: Neurology

## 2016-12-28 VITALS — BP 120/80 | HR 72 | Ht 69.0 in | Wt 192.5 lb

## 2016-12-28 DIAGNOSIS — G40309 Generalized idiopathic epilepsy and epileptic syndromes, not intractable, without status epilepticus: Secondary | ICD-10-CM | POA: Diagnosis not present

## 2016-12-28 NOTE — Progress Notes (Signed)
NEUROLOGY FOLLOW UP OFFICE NOTE  Rebecca Silva [id number]  HISTORY OF PRESENT ILLNESS: Rebecca Silva is a 46 year old right-handed woman with history of Hashimoto's thyroiditis, asthma and Wolff-Parkinson-White who follows up for primary generalized epilepsy with absence seizures.     UPDATE: She takes Keppra 1500mg  twice daily.  She has not had any noticeable events.  She is feeling well.  She splits the morning dose of Keppra from 1500mg  twice daily to 750mg  in AM, 750mg  at noon and 1500mg  at bedtime because it makes her sleepy.   HISTORY: She has had these episodes of eye flutter at least since adolescence.  Suddenly, both of her eyes will roll back for one or two seconds.  She is not aware that it is happening.  Initially, they occured several times a day, every few minutes.  There is a question about whether she loses awareness during an episode.  If you start a sentence when it occurs, she is not aware of what you said.  If she is speaking, she will stop and say "um" when it occurs.  She denies headache or focal abnormalities.  She denies any abnormal movements or gait abnormalities.  They occur spontaneously and not triggered or relieved by anything particular.  She says that her mom has it but less severe.  Her grandfather had epilepsy.    She had an ambulatory EEG, which showed brief intermittent bursts of generalized slowing with imbedded sharp waves, suggestive of primary generalized epilepsy.    She also had an MRI of the brain performed on 12/13/14, which was personally reviewed and showed no evidence of acute infarct, mass lesion, hemorrhage or mesial temporal sclerosis.  PAST MEDICAL HISTORY: Past Medical History:  Diagnosis Date  . Asthma   . Heart murmur   . History of UTI   . Hypothyroidism   . Seasonal allergies   . Seizure disorder ([place])   . Urine incontinence   . Wolff-Parkinson-White pattern     MEDICATIONS: Current Outpatient Prescriptions on File Prior to  Visit  Medication Sig Dispense Refill  . albuterol (PROVENTIL) (2.5 MG/3ML) 0.083% nebulizer solution Take 3 mLs (2.5 mg total) by nebulization every 6 (six) hours as needed for wheezing or shortness of breath. 75 mL 0  . budesonide-formoterol (SYMBICORT) 80-4.5 MCG/ACT inhaler Take 2 puffs first thing in am and then another 2 puffs about 12 hours later. 1 Inhaler 11  . Cholecalciferol (VITAMIN D) 2000 units tablet Take 1 tablet by mouth daily. Reported on 12/31/2015    . levETIRAcetam (KEPPRA) 750 MG tablet TAKE (2) TABLETS BY MOUTH TWICE DAILY. 120 tablet 5  . levothyroxine (SYNTHROID, LEVOTHROID) 150 MCG tablet Take 150 mcg by mouth daily before breakfast.    . montelukast (SINGULAIR) 10 MG tablet Take 1 tablet (10 mg total) by mouth at bedtime. 30 tablet 3  . naproxen (NAPROSYN) 500 MG tablet Take 1 tablet (500 mg total) by mouth 2 (two) times daily. 14 tablet 0  . PROAIR HFA 108 (90 Base) MCG/ACT inhaler INHALE 2 PUFFS EVERY 4 HOURS AS NEEDED. ONLY IF YOU CAN'T CATCH YOUR BREATH. 8.5 g 0   No current facility-administered medications on file prior to visit.     ALLERGIES: No Known Allergies  FAMILY HISTORY: Family History  Problem Relation Age of Onset  . Heart disease Mother 21       open heart surgery  . Heart disease Maternal Grandmother   . Epilepsy Maternal Grandfather     SOCIAL  HISTORY: Social History   Social History  . Marital status: Married    Spouse name: N/A  . Number of children: N/A  . 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 Smoking status: Never Smoker  . Smokeless tobacco: Never Used  . Alcohol use No  . Drug use: No  . Sexual activity: Yes    Partners: Male   Other Topics Concern  . 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   Children: 19 yrs and 15 yrs   Regular exercise: walking/running 2 x a wk   Caffeine use: coffee daily, sweet tea     REVIEW OF  SYSTEMS: Constitutional: No fevers, chills, or sweats, no generalized fatigue, change in appetite Eyes: No visual changes, double vision, eye pain Ear, nose and throat: No hearing loss, ear pain, nasal congestion, sore throat Cardiovascular: No chest pain, palpitations Respiratory:  No shortness of breath at rest or with exertion, wheezes GastrointestinaI: No nausea, vomiting, diarrhea, abdominal pain, fecal incontinence Genitourinary:  No dysuria, urinary retention or frequency Musculoskeletal:  No neck pain, back pain Integumentary: No rash, pruritus, skin lesions Neurological: as above Psychiatric: No depression, insomnia, anxiety Endocrine: No palpitations, fatigue, diaphoresis, mood swings, change in appetite, change in weight, increased thirst Hematologic/Lymphatic:  No purpura, petechiae. Allergic/Immunologic: no itchy/runny eyes, nasal congestion, recent allergic reactions, rashes  PHYSICAL EXAM: Vitals:   12/28/16 1404  BP: 120/80  Pulse: 72   General: No acute distress.  Patient appears well-groomed.  normal body habitus. Head:  Normocephalic/atraumatic Eyes:  Fundi examined but not visualized Neck: supple, no paraspinal tenderness, full range of motion Heart:  Regular rate and rhythm Lungs:  Clear to auscultation bilaterally Back: No paraspinal tenderness Neurological Exam: alert and oriented to person, place, and time. Attention span and concentration intact, recent and remote memory intact, fund of knowledge intact.  Speech fluent and not dysarthric, language intact.  CN II-XII intact. Bulk and tone normal, muscle strength 5/5 throughout.  Sensation to light touch, temperature and vibration intact.  Deep tendon reflexes 2+ throughout, toes downgoing.  Finger to nose and heel to shin testing intact.  Gait normal, Romberg negative.  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  PLAN: 1.  Continue Keppra 1500mg  twice daily (or  750mg /750mg /1500mg ).  I suggested extended-release levetiracetam 3000mg  at bedtime may be a consideration in the future. 2.  Follow up in one year.  Metta Clines, DO  CC: Pricilla Holm, MD

## 2017-02-05 ENCOUNTER — Other Ambulatory Visit: Payer: Self-pay | Admitting: Endocrinology

## 2017-03-13 ENCOUNTER — Other Ambulatory Visit: Payer: Self-pay | Admitting: Internal Medicine

## 2017-03-13 DIAGNOSIS — J4540 Moderate persistent asthma, uncomplicated: Secondary | ICD-10-CM

## 2017-03-18 ENCOUNTER — Ambulatory Visit (INDEPENDENT_AMBULATORY_CARE_PROVIDER_SITE_OTHER): Payer: Managed Care, Other (non HMO) | Admitting: Orthopedic Surgery

## 2017-03-23 IMAGING — US US SOFT TISSUE HEAD/NECK
1 series · 14 of 25 positions shown · non-contrast
Comparison: 01/10/2015

CLINICAL DATA: Goiter

EXAM:
THYROID ULTRASOUND
TECHNIQUE: Ultrasound examination of the thyroid gland and adjacent soft
tissues was performed.

[Series 1: us soft tissue head/neck · 0.07mm/px · 14 of 32 slices shown]
[im 1/32]
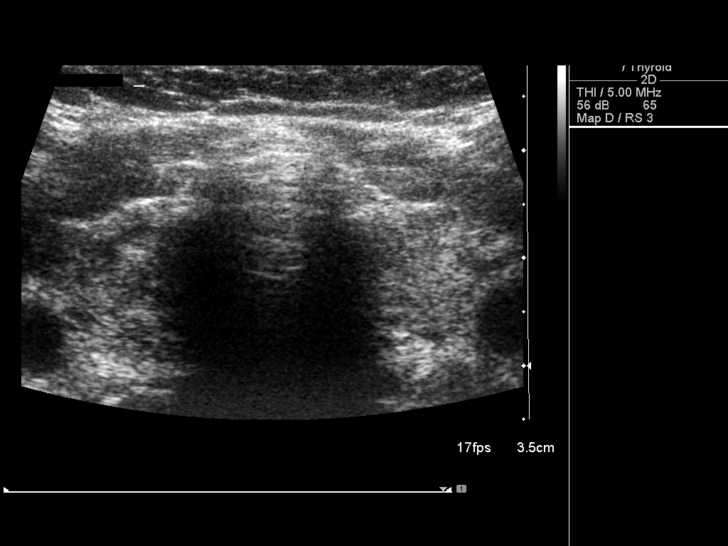
[im 3/32]
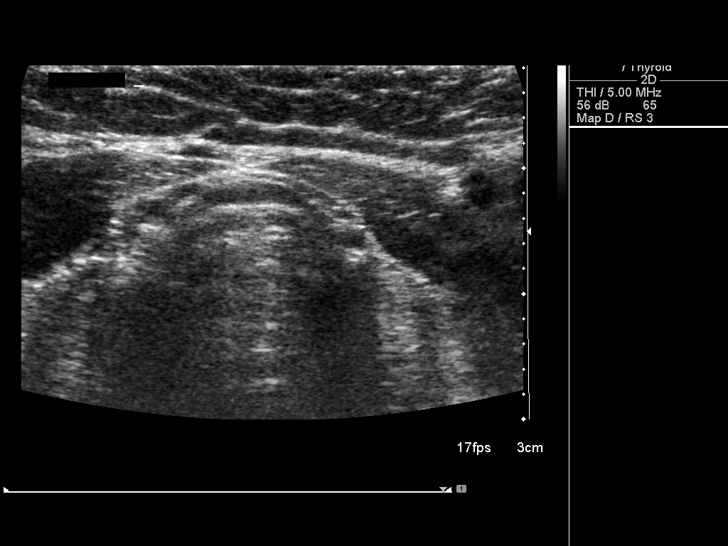
[im 6/32]
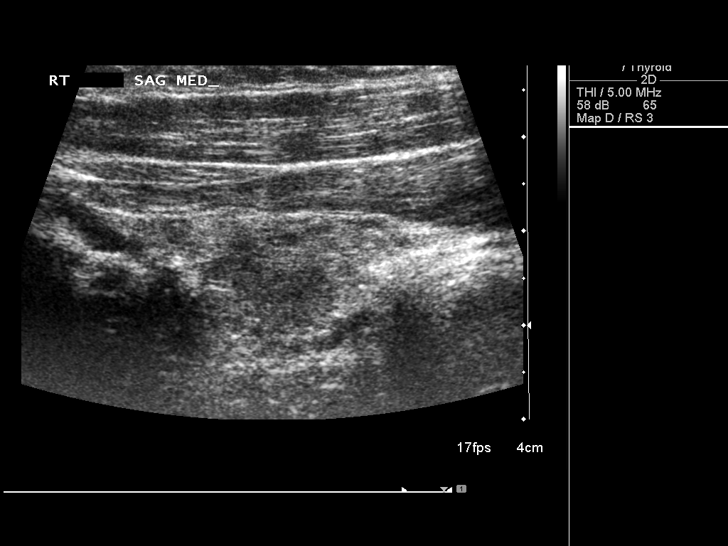
[im 8/32]
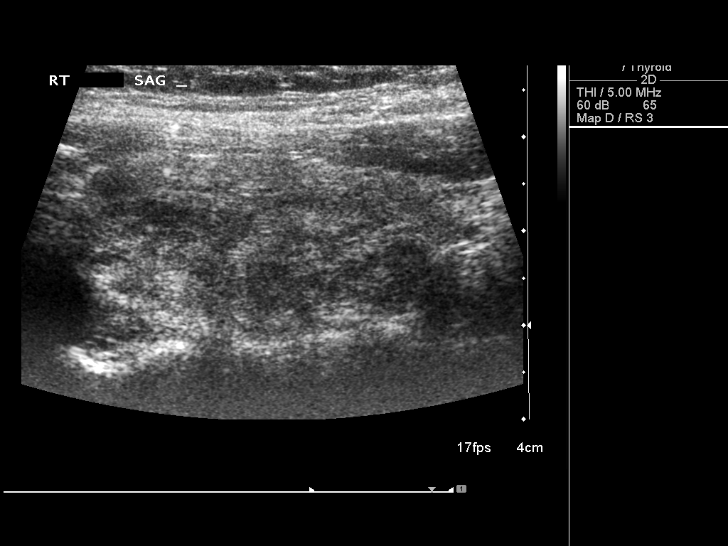
[im 11/32]
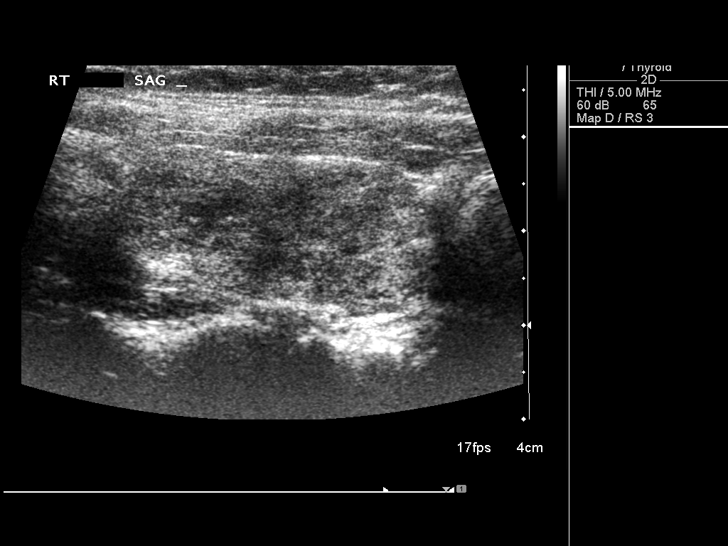
[im 12/32]
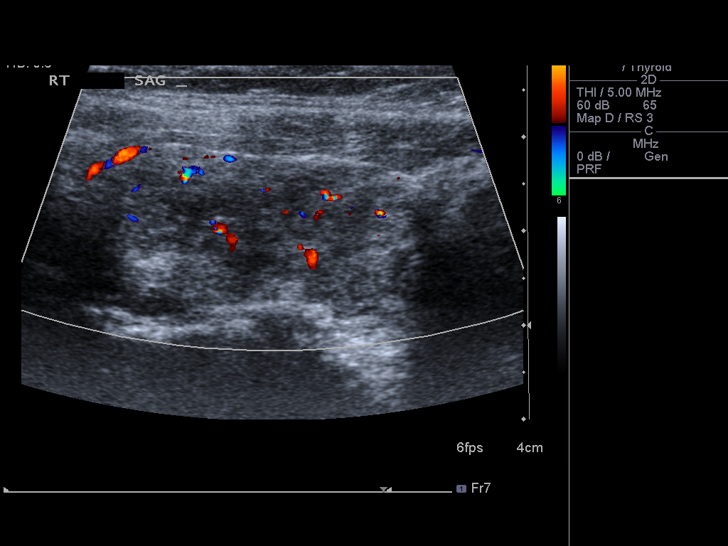
[im 15/32]
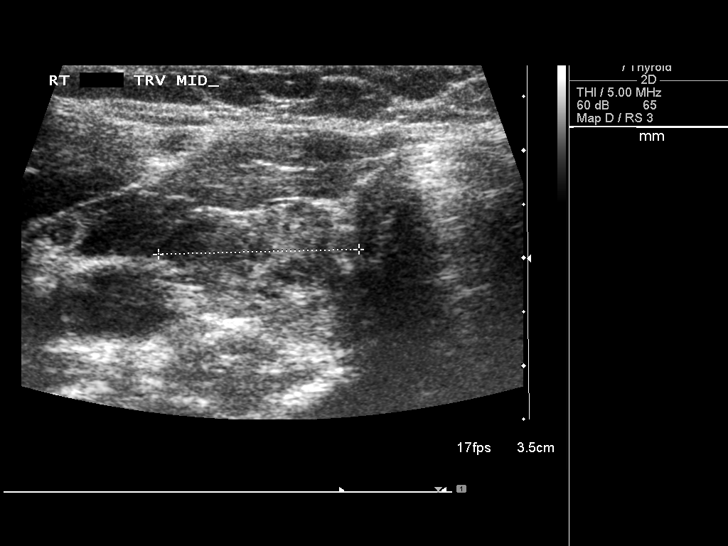
[im 17/32]
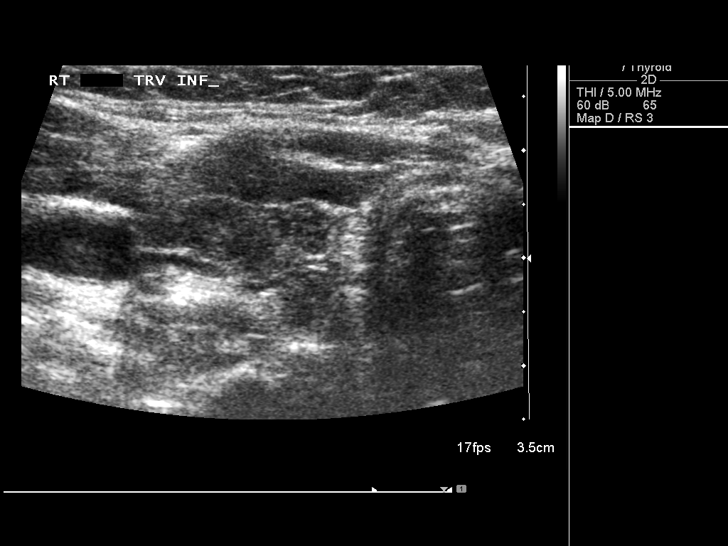
[im 20/32]
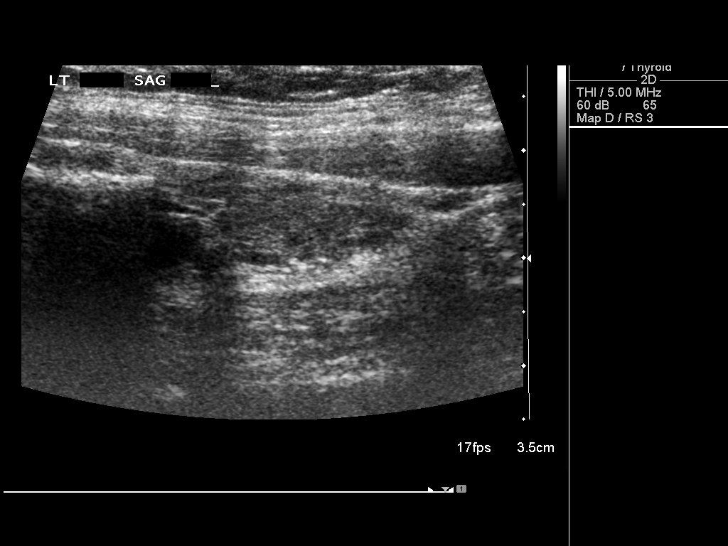
[im 21/32]
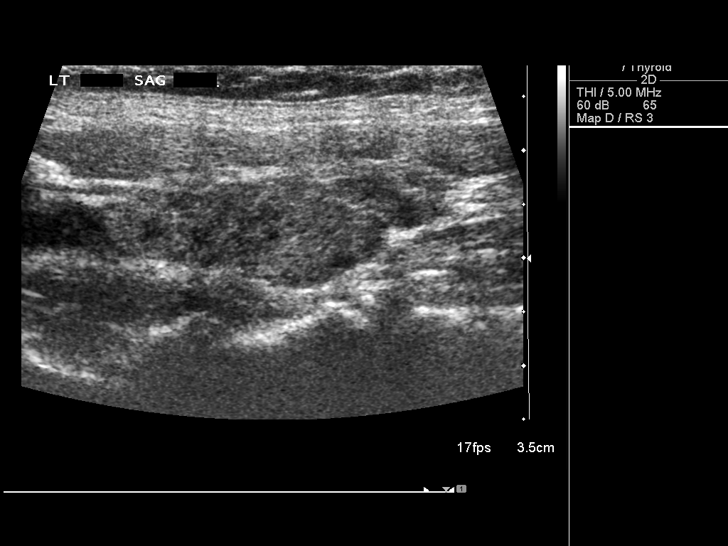
[im 24/32]
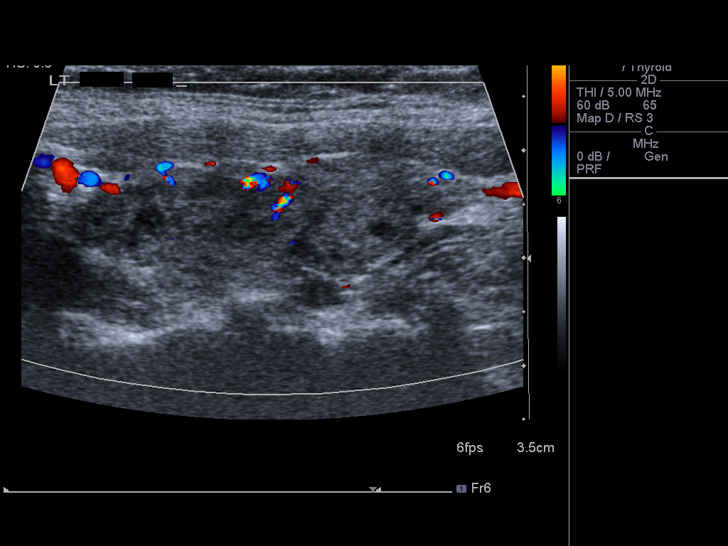
[im 26/32]
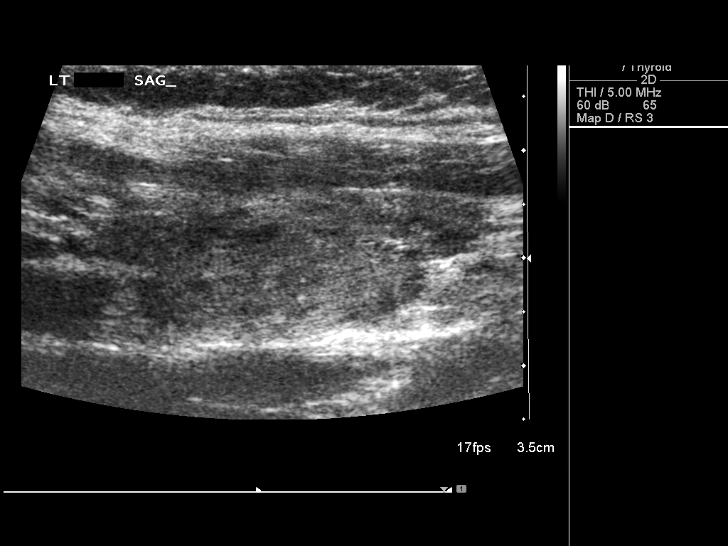
[im 29/32]
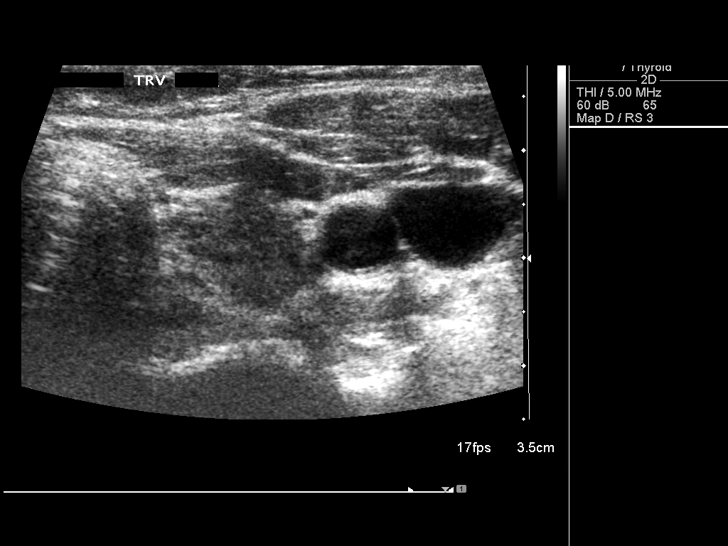
[im 32/32]
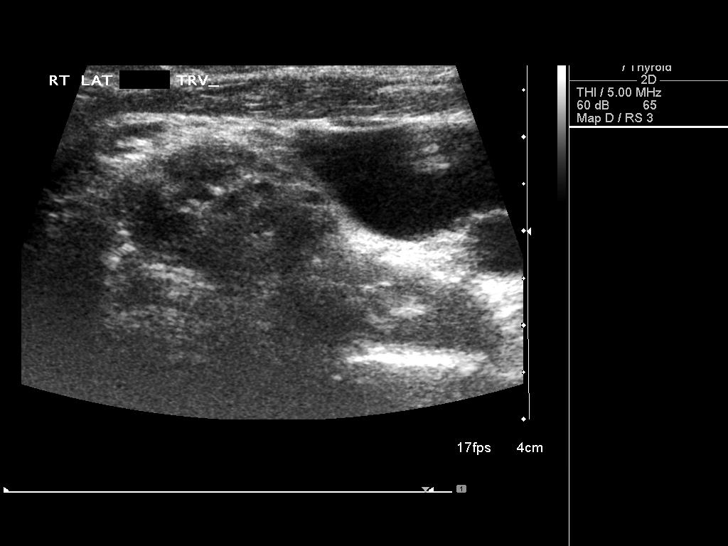

[14 of 25 positions shown; findings below may reference images not displayed]

FINDINGS: Right thyroid lobe

Measurements: 3.9 x 1.6 x 1.9 cm. Inhomogeneous background
echotexture without focal lesion or hyperemia.

Left thyroid lobe

Measurements: 3.9 x 1.5 x 1.8 cm.  No nodules visualized.

Isthmus

Thickness: 0.2 cm.  No nodules visualized.

Lymphadenopathy

None visualized.
IMPRESSION: 1. Small inhomogeneous thyroid without focal lesion.

## 2017-03-31 ENCOUNTER — Ambulatory Visit (INDEPENDENT_AMBULATORY_CARE_PROVIDER_SITE_OTHER): Payer: Managed Care, Other (non HMO) | Admitting: Orthopedic Surgery

## 2017-03-31 ENCOUNTER — Telehealth (INDEPENDENT_AMBULATORY_CARE_PROVIDER_SITE_OTHER): Payer: Self-pay | Admitting: Radiology

## 2017-03-31 ENCOUNTER — Encounter (INDEPENDENT_AMBULATORY_CARE_PROVIDER_SITE_OTHER): Payer: Self-pay | Admitting: Orthopedic Surgery

## 2017-03-31 ENCOUNTER — Ambulatory Visit (INDEPENDENT_AMBULATORY_CARE_PROVIDER_SITE_OTHER): Payer: Managed Care, Other (non HMO)

## 2017-03-31 DIAGNOSIS — M25512 Pain in left shoulder: Secondary | ICD-10-CM

## 2017-03-31 DIAGNOSIS — M5412 Radiculopathy, cervical region: Secondary | ICD-10-CM

## 2017-03-31 DIAGNOSIS — G8929 Other chronic pain: Secondary | ICD-10-CM | POA: Diagnosis not present

## 2017-03-31 MED ORDER — METHOCARBAMOL 500 MG PO TABS: 500.0000 mg | ORAL_TABLET | Freq: Three times a day (TID) | ORAL | 0 refills | Status: DC | PRN

## 2017-03-31 MED ORDER — METHYLPREDNISOLONE 4 MG PO TABS: ORAL_TABLET | ORAL | 0 refills | Status: DC

## 2017-03-31 MED ORDER — BACLOFEN 10 MG PO TABS: 10.0000 mg | ORAL_TABLET | Freq: Two times a day (BID) | ORAL | 0 refills | Status: DC | PRN

## 2017-03-31 NOTE — Telephone Encounter (Signed)
Please advise. Thanks.  

## 2017-03-31 NOTE — Telephone Encounter (Signed)
wil you please fax this

## 2017-03-31 NOTE — Telephone Encounter (Signed)
Script sent to pharmacy.

## 2017-03-31 NOTE — Progress Notes (Deleted)
Cardiology Office Note    Date:  03/31/2017   ID:  Rebecca Silva, DOB 02/09/1971, MRN [id number]  PCP:  Hoyt Koch, MD  Cardiologist: Dr. Stanford Breed   No chief complaint on file.   History of Present Illness:    Rebecca Silva is a 47 y.o. female with past medical history of hypothyroidism, asthma, seizure disorder, and WPW pattern who presents to the office today for 45-month follow-up.   She was last examined by Ignacia Bayley, NP as a new patient referral in 07/2016 for evaluation of chest pain. Her symptoms were thought to be atypical as pain was reproducible on palpation and relieved with NSAIDS. An ETT was performed and showed EKG changes reflective of WPW waveforms and no significant evidence of heart block. An echocardiogram also showed an EF of 55-60% with no significant WMA or valve abnormalities.     Past Medical History:  Diagnosis Date  . Asthma   . Heart murmur   . History of UTI   . Hypothyroidism   . Seasonal allergies   . Seizure disorder ([place])   . Urine incontinence   . Wolff-Parkinson-White pattern     No past surgical history on file.  Current Medications: Outpatient Medications Prior to Visit  Medication Sig Dispense Refill  . albuterol (PROVENTIL) (2.5 MG/3ML) 0.083% nebulizer solution Take 3 mLs (2.5 mg total) by nebulization every 6 (six) hours as needed for wheezing or shortness of breath. 75 mL 0  . budesonide-formoterol (SYMBICORT) 80-4.5 MCG/ACT inhaler Take 2 puffs first thing in am and then another 2 puffs about 12 hours later. 1 Inhaler 11  . Cholecalciferol (VITAMIN D) 2000 units tablet Take 1 tablet by mouth daily. Reported on 12/31/2015    . levETIRAcetam (KEPPRA) 750 MG tablet TAKE (2) TABLETS BY MOUTH TWICE DAILY. 120 tablet 5  . montelukast (SINGULAIR) 10 MG tablet Take 1 tablet (10 mg total) by mouth at bedtime. 30 tablet 3  . naproxen (NAPROSYN) 500 MG tablet Take 1 tablet (500 mg total) by mouth 2 (two) times daily. 14  tablet 0  . PROAIR HFA 108 (90 Base) MCG/ACT inhaler INHALE 2 PUFFS EVERY 4 HOURS AS NEEDED. ONLY IF YOU CAN'T CATCH YOUR BREATH. 8.5 g 0  . SYNTHROID 150 MCG tablet TAKE ONE TABLET BY MOUTH EVERY MORNING 60 tablet 0   No facility-administered medications prior to visit.      Allergies:   Patient has no known allergies.   Social History   Social History  . Marital status: Married    Spouse name: N/A  . Number of children: N/A  . 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 Smoking status: Never Smoker  . Smokeless tobacco: Never Used  . Alcohol use No  . Drug use: No  . Sexual activity: Yes    Partners: Male   Other Topics Concern  . 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   Review of Systems:   Please see the history of present illness.  General:  No chills, fever, night sweats or weight changes.  Cardiovascular:  No chest pain, dyspnea on exertion, edema, orthopnea, palpitations, paroxysmal nocturnal dyspnea. Dermatological: No rash, lesions/masses Respiratory: No cough, dyspnea Urologic: No hematuria, dysuria Abdominal:   No nausea, vomiting, diarrhea, bright red blood per rectum, melena, or hematemesis Neurologic:  No visual changes, wkns, changes in mental status. All other systems reviewed and are otherwise negative except as noted above.   Physical Exam:    VS:  There were no vitals taken for this visit.   General: Well developed, well nourished,female appearing in no acute distress. Head: Normocephalic, atraumatic, sclera non-icteric, no xanthomas, nares are without  discharge.  Neck: No carotid bruits. JVD not elevated.  Lungs: Respirations regular and unlabored, without wheezes or rales.  Heart: ***Regular rate and rhythm. No S3 or S4.  No murmur, no rubs, or gallops appreciated. Abdomen: Soft, non-tender, non-distended with normoactive bowel sounds. No hepatomegaly. No rebound/guarding. No obvious abdominal masses. Msk:  Strength and tone appear normal for age. No joint deformities or effusions. Extremities: No clubbing or cyanosis. No edema.  Distal pedal pulses are 2+ bilaterally. Neuro: Alert and oriented X 3. Moves all extremities spontaneously. No focal deficits noted. Psych:  Responds to questions appropriately with a normal affect. Skin: No rashes or lesions noted  Wt Readings from Last 3 Encounters:  12/28/16 192 lb 8 oz (87.3 kg)  12/17/16 195 lb (88.5 kg)  11/23/16 198 lb (89.8 kg)        Studies/Labs Reviewed:   EKG:  EKG is*** ordered today.  The ekg ordered today demonstrates ***  Recent Labs: 07/01/2016: Magnesium 2.2 11/26/2016: ALT 15; BUN 12; Creatinine, Ser 0.83; Hemoglobin 11.1; Platelets 299.0; Potassium 4.2; Sodium 139; TSH 3.22   Lipid Panel    Component Value Date/Time   CHOL 196 11/26/2016 0832   TRIG 116.0 11/26/2016 0832   HDL 39.90 11/26/2016 0832   CHOLHDL 5 11/26/2016 0832   VLDL 23.2 11/26/2016 0832   LDLCALC 133 (H) 11/26/2016 0832    Additional studies/ records that were reviewed today include:   ETT: 07/2016   Blood pressure demonstrated a hypertensive response to exercise.  No T wave inversion was noted during stress.  Horizontal ST segment depression ST segment depression of 2 mm was noted during stress in the II, III, aVF, V6, V5 and V4 leads, and returning to baseline after 1-5 minutes of recovery.  Blood pressure demonstrated a hypertensive response to exercise.  Heart rate demonstrated an exaggerated response to exercise  Overall, the patient's exercise capacity was normal.  [facility]  Treadmill Score: intermediate risk   Borderline abnormal ETT with 2 mm horizontal to upsloping ST segment depression inferiorly and laterally. Given the finding of delta waves suggestive of WPW, the findings are more consistent with pre-excitation and not likely ischemia.   Echocardiogram: 08/2016  Study Conclusions  - Left ventricle: The cavity size was normal. Systolic function was   normal. The estimated ejection fraction was in the range of 55%   to 60%. Wall motion was normal; there were no regional wall   motion abnormalities. Left ventricular diastolic function   parameters were normal.  Assessment:    No diagnosis found.   Plan:   In order of problems listed above:  1. ***    Medication Adjustments/Labs and Tests Ordered: Current medicines are reviewed at length with the patient today.  Concerns regarding medicines are outlined above.  Medication changes, Labs and Tests ordered today are listed  in the Patient Instructions below. There are no Patient Instructions on file for this visit.   Signed, Erma Heritage, PA-C  03/31/2017 8:23 AM    [facility], [place], [place]  [address] Phone: [phone]; Fax: [phone]  [address], [place], [place] [address] Phone: [phone]

## 2017-03-31 NOTE — Telephone Encounter (Signed)
Try baclofen 10 mg po bid  spasm # 30 pls clal htx

## 2017-03-31 NOTE — Addendum Note (Signed)
Addended by: Meyer Cory on: 03/31/2017 05:55 PM   Modules accepted: Orders

## 2017-03-31 NOTE — Telephone Encounter (Signed)
Rebecca Silva is calling states that the methocarbamol is on backorder from the manufacturer and they are unable to get it.  He wants to know if Dr. Marlou Sa would like to change the rx to something different.  Please call them to advise.  Thanks

## 2017-04-01 ENCOUNTER — Ambulatory Visit: Payer: Managed Care, Other (non HMO) | Admitting: Student

## 2017-04-02 ENCOUNTER — Ambulatory Visit: Payer: Managed Care, Other (non HMO) | Admitting: Endocrinology

## 2017-04-03 NOTE — Progress Notes (Signed)
Office Visit Note   Patient: Rebecca Silva           Date of Birth: 1971-03-29           MRN: [id number] Visit Date: 03/31/2017 Requested by: Hoyt Koch, MD [place], [place] [id number] PCP: Hoyt Koch, MD  Subjective: Chief Complaint  Patient presents with  . Left Shoulder - Pain    HPI: Rogelio is a 46 year old patient with left shoulder pain.  Denies any history of injury.  She has had pain in the shoulder and left arm since January.  The pain comes and goes.  She is unable to pinpoint what makes it worse.  She is right-hand dominant.  Occasionally she will wake with pain from sleep.  Reports a radicular component of the pain radiating into the left shoulder into the neck and down her arm.  She takes naproxen for pain.  She works as a Radio broadcast assistant.  She can sleep on the left-hand side.  Denies any weakness.  Does report some numbness and tingling in her hand.  Symptoms also occasionally go into her face.  She also reports pain in the shoulder blade area              ROS: All systems reviewed are negative as they relate to the chief complaint within the history of present illness.  Patient denies  fevers or chills.   Assessment & Plan: Visit Diagnoses:  1. Chronic left shoulder pain   2. Cervical radiculopathy     Plan: Impression is likely left-sided radiculopathy with normal shoulder exam and a history of radiation into the neck and shoulder blade which makes it consistent mostly with radiculopathy.  Plan is Medrol dosepak and muscle relaxer with MRI scan of the cervical spine to evaluate left-sided radiculopathy.  I'll see her back after that study  Follow-Up Instructions: Return for after MRI.   Orders:  Orders Placed This Encounter  Procedures  . XR Shoulder Left  . XR Cervical Spine 2 or 3 views  . MR Cervical Spine w/o contrast   Meds ordered this encounter  Medications  . methylPREDNISolone (MEDROL) 4 MG tablet    Sig: Take  dosepak as directed    Dispense:  21 tablet    Refill:  0  . methocarbamol (ROBAXIN) 500 MG tablet    Sig: Take 1 tablet (500 mg total) by mouth every 8 (eight) hours as needed for muscle spasms.    Dispense:  30 tablet    Refill:  0      Procedures: No procedures performed   Clinical Data: No additional findings.  Objective: Vital Signs: There were no vitals taken for this visit.  Physical Exam:   Constitutional: Patient appears well-developed HEENT:  Head: Normocephalic Eyes:EOM are normal Neck: Normal range of motion Cardiovascular: Normal rate Pulmonary/chest: Effort normal Neurologic: Patient is alert Skin: Skin is warm Psychiatric: Patient has normal mood and affect    Ortho Exam: Orthopedic exam demonstrates full active and passive range of motion of the left shoulder.  Negative apprehension relocation testing.  Negative impingement signs.  Patient has good shoulder stability.  No other masses, adenopathy or skin changes noted in the shoulder girdle region.  Rotator cuff strength intact and passive range of motion is symmetric left versus right shoulder.  Cervical spine shows good range of motion flexion extension rotation with 5 out of 5 grip EPL FPL interosseous wrist flexion-extension biceps triceps and deltoid strength.  No  definite paresthesias C5-T1.  Reflexes symmetric bilateral biceps and triceps  Specialty Comments:  No specialty comments available.  Imaging: No results found.   PMFS History: Patient Active Problem List   Diagnosis Date Noted  . Allergic rhinitis 12/18/2016  . Wolff-Parkinson-White (WPW) pattern 07/30/2016  . Routine general medical examination at a health care facility 04/15/2015  . Generalized idiopathic epilepsy, not intractable, without status epilepticus ([place]) 03/18/2015  . Vitamin D deficiency 10/10/2014  . Moderate persistent asthma without complication 17/40/8144  . Hypothyroidism 05/19/2013   Past Medical History:    Diagnosis Date  . Asthma   . Heart murmur   . History of UTI   . Hypothyroidism   . Seasonal allergies   . Seizure disorder ([place])   . Urine incontinence   . Wolff-Parkinson-White pattern     Family History  Problem Relation Age of Onset  . Heart disease Mother 51       open heart surgery  . Heart disease Maternal Grandmother   . Epilepsy Maternal Grandfather     No past surgical history on file. Social History   Occupational History  . Paralegal     Social History Main Topics  . Smoking status: Never Smoker  . Smokeless tobacco: Never Used  . Alcohol use No  . Drug use: No  . Sexual activity: Yes    Partners: Male

## 2017-04-15 ENCOUNTER — Ambulatory Visit
Admission: RE | Admit: 2017-04-15 | Discharge: 2017-04-15 | Disposition: A | Discharge status: Home / Self Care | Payer: Managed Care, Other (non HMO) | Source: Ambulatory Visit | Attending: Orthopedic Surgery | Admitting: Orthopedic Surgery

## 2017-04-15 DIAGNOSIS — M5412 Radiculopathy, cervical region: Secondary | ICD-10-CM

## 2017-04-19 ENCOUNTER — Encounter (INDEPENDENT_AMBULATORY_CARE_PROVIDER_SITE_OTHER): Payer: Self-pay | Admitting: Orthopedic Surgery

## 2017-04-19 ENCOUNTER — Ambulatory Visit (INDEPENDENT_AMBULATORY_CARE_PROVIDER_SITE_OTHER): Payer: BLUE CROSS/BLUE SHIELD | Admitting: Orthopedic Surgery

## 2017-04-19 DIAGNOSIS — M5412 Radiculopathy, cervical region: Secondary | ICD-10-CM | POA: Diagnosis not present

## 2017-04-22 NOTE — Progress Notes (Signed)
Office Visit Note   Patient: Rebecca Silva           Date of Birth: 1971/02/07           MRN: [id number] Visit Date: 04/19/2017 Requested by: Hoyt Koch, MD [place], [place] [address] PCP: Hoyt Koch, MD  Subjective: Chief Complaint  Patient presents with  . Neck - Follow-up    HPI: [name] is a 46 year old patient with neck pain.  Since I have seen her she has had MRI of cervical spine.  She's had pain for several months.  Scan is reviewed with the patient.  It shows a small central disc at C5-6.  It is only bothered her one time since her last office visit.  The Medrol Dosepak helped.  She was initially having left shoulder and shoulder blade pain.  Occasionally it would radiate to the clavicle.  She denies any weakness in her arm.              ROS: All systems reviewed are negative as they relate to the chief complaint within the history of present illness.  Patient denies  fevers or chills.   Assessment & Plan: Visit Diagnoses:  1. Cervical radiculopathy     Plan: Impression is cervical spine pain with small central disc.  Plan is medical management first with anti-inflammatories and muscle relaxers.  If pain persists then she will call and we will arrange for epidural steroid injection.  In general the Medrol Dosepak is calm her symptoms down in the central disc protrusion doesn't match up with her level of symptoms.  I will see her back as needed  Follow-Up Instructions: No Follow-up on file.   Orders:  No orders of the defined types were placed in this encounter.  No orders of the defined types were placed in this encounter.     Procedures: No procedures performed   Clinical Data: No additional findings.  Objective: Vital Signs: There were no vitals taken for this visit.  Physical Exam:   Constitutional: Patient appears well-developed HEENT:  Head: Normocephalic Eyes:EOM are normal Neck: Normal range of  motion Cardiovascular: Normal rate Pulmonary/chest: Effort normal Neurologic: Patient is alert Skin: Skin is warm Psychiatric: Patient has normal mood and affect    Ortho Exam: Orthopedic exam demonstrates good cervical spine range of motion good strength in bilateral upper extremities.  No rotator cuff weakness.  The rest of her exam is unchanged  Specialty Comments:  No specialty comments available.  Imaging: No results found.   PMFS History: Patient Active Problem List   Diagnosis Date Noted  . Allergic rhinitis 12/18/2016  . Wolff-Parkinson-White (WPW) pattern 07/30/2016  . Routine general medical examination at a health care facility 04/15/2015  . Generalized idiopathic epilepsy, not intractable, without status epilepticus ([place]) 03/18/2015  . Vitamin D deficiency 10/10/2014  . Moderate persistent asthma without complication 41/74/0814  . Hypothyroidism 05/19/2013   Past Medical History:  Diagnosis Date  . Asthma   . Heart murmur   . History of UTI   . Hypothyroidism   . Seasonal allergies   . Seizure disorder ([place])   . Urine incontinence   . Wolff-Parkinson-White pattern     Family History  Problem Relation Age of Onset  . Heart disease Mother 59       open heart surgery  . Heart disease Maternal Grandmother   . Epilepsy Maternal Grandfather     No past surgical history on file. Social History  Occupational History  . Paralegal     Social History Main Topics  . Smoking status: Never Smoker  . Smokeless tobacco: Never Used  . Alcohol use No  . Drug use: No  . Sexual activity: Yes    Partners: Male

## 2017-04-25 NOTE — Progress Notes (Signed)
Cardiology Office Note    Date:  04/26/2017   ID:  YULIZA CARA, DOB 1970-08-09, MRN [id number]  PCP:  Hoyt Koch, MD  Cardiologist: Dr. Stanford Breed   Chief Complaint  Patient presents with  . Follow-up    6 months    History of Present Illness:    Rebecca Silva is a 46 y.o. female  with past medical history of hypothyroidism, asthma, seizure disorder, and WPW pattern who presents to the office today for 90-month follow-up.   She was last examined by Ignacia Bayley, NP as a new patient referral in 07/2016 for evaluation of chest pain. Her symptoms were thought to be atypical as pain was reproducible on palpation and relieved with NSAIDS. An ETT was performed and showed EKG changes reflective of WPW waveforms and no significant evidence of heart block. An echocardiogram also showed an EF of 55-60% with no significant WMA or valve abnormalities.   In talking with the patient today, she reports doing well from a cardiac perspective since her last office visit. She denies any repeat episodes of chest pain or dyspnea on exertion. She ran her first 5K in over 10+ years this past weekend without any anginal symptoms.  She has experienced pain along her left shoulder and has been started on a steroid pack along with Baclofen by her Orthopedist for cervical radiculopathy secondary to a bulging disc. She reports this has significantly helped with her discomfort.  She denies any recent orthopnea, PND, lower extremity edema, palpitations, lightheadedness, dizziness, or presyncope.   Past Medical History:  Diagnosis Date  . Asthma   . Heart murmur   . History of UTI   . Hypothyroidism   . Seasonal allergies   . Seizure disorder ([place])   . Urine incontinence   . Wolff-Parkinson-White pattern    a. noted on EKG in 07/2016. Echo showed a preserved EF of 55-60% with no regional WMA. ETT showed WPW waveforms with no evidence of HB.     No past surgical history on file.  Current  Medications: Outpatient Medications Prior to Visit  Medication Sig Dispense Refill  . albuterol (PROVENTIL) (2.5 MG/3ML) 0.083% nebulizer solution Take 3 mLs (2.5 mg total) by nebulization every 6 (six) hours as needed for wheezing or shortness of breath. 75 mL 0  . baclofen (LIORESAL) 10 MG tablet Take 1 tablet (10 mg total) by mouth 2 (two) times daily as needed for muscle spasms. 30 each 0  . budesonide-formoterol (SYMBICORT) 80-4.5 MCG/ACT inhaler Take 2 puffs first thing in am and then another 2 puffs about 12 hours later. 1 Inhaler 11  . Cholecalciferol (VITAMIN D) 2000 units tablet Take 1 tablet by mouth daily. Reported on 12/31/2015    . levETIRAcetam (KEPPRA) 750 MG tablet TAKE (2) TABLETS BY MOUTH TWICE DAILY. 120 tablet 5  . naproxen (NAPROSYN) 500 MG tablet Take 1 tablet (500 mg total) by mouth 2 (two) times daily. 14 tablet 0  . PROAIR HFA 108 (90 Base) MCG/ACT inhaler INHALE 2 PUFFS EVERY 4 HOURS AS NEEDED. ONLY IF YOU CAN'T CATCH YOUR BREATH. 8.5 g 0  . SYNTHROID 150 MCG tablet TAKE ONE TABLET BY MOUTH EVERY MORNING 60 tablet 0  . methocarbamol (ROBAXIN) 500 MG tablet Take 1 tablet (500 mg total) by mouth every 8 (eight) hours as needed for muscle spasms. 30 tablet 0  . methylPREDNISolone (MEDROL) 4 MG tablet Take dosepak as directed 21 tablet 0  . montelukast (SINGULAIR) 10 MG tablet Take  1 tablet (10 mg total) by mouth at bedtime. 30 tablet 3   No facility-administered medications prior to visit.      Allergies:   Patient has no known allergies.   Social History   Social History  . Marital status: Married    Spouse name: N/A  . Number of children: N/A  . 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 Smoking status: Never Smoker  . Smokeless tobacco: Never Used  . Alcohol use No  . Drug use: No  . Sexual activity: Yes    Partners: Male   Other Topics Concern  . None   Social History Narrative   Paralegal    Married: 23 yrs   Children: 57 yrs and 15 yrs   Regular exercise: walking/running 2 x a wk   Caffeine use: coffee daily, sweet tea      Family History:  The patient's family history includes Epilepsy in her maternal grandfather; Heart disease in her maternal grandmother; Heart disease (age of onset: 105) in her mother.   Review of Systems:   Please see the history of present illness.     General:  No chills, fever, night sweats or weight changes.  Cardiovascular:  No chest pain, dyspnea on exertion, edema, orthopnea, palpitations, paroxysmal nocturnal dyspnea. Dermatological: No rash, lesions/masses Respiratory: No cough, dyspnea Urologic: No hematuria, dysuria MSK: Positive for left shoulder pain.  Abdominal:   No nausea, vomiting, diarrhea, bright red blood per rectum, melena, or hematemesis Neurologic:  No visual changes, wkns, changes in mental status.  All other systems reviewed and are otherwise negative except as noted above.   Physical Exam:    VS:  BP 120/82   Pulse 66   Ht 5\' 9"  (1.753 m)   Wt 190 lb 9.6 oz (86.5 kg)   BMI 28.15 kg/m    General: Well developed, well nourished Caucasian female appearing in no acute distress. Head: Normocephalic, atraumatic, sclera non-icteric, no xanthomas, nares are without discharge.  Neck: No carotid bruits. JVD not elevated.  Lungs: Respirations regular and unlabored, without wheezes or rales.  Heart: Regular rate and rhythm. No S3 or S4.  No murmur, no rubs, or gallops appreciated. Abdomen: Soft, non-tender, non-distended with normoactive bowel sounds. No hepatomegaly. No rebound/guarding. No obvious abdominal masses. Msk:  Strength and tone appear normal for age. No joint deformities or effusions. Extremities: No clubbing or cyanosis. No lower extremity edema.  Distal pedal pulses are 2+ bilaterally. Neuro: Alert and oriented X 3. Moves all extremities spontaneously. No focal deficits noted. Psych:  Responds to questions  appropriately with a normal affect. Skin: No rashes or lesions noted  Wt Readings from Last 3 Encounters:  04/26/17 190 lb 9.6 oz (86.5 kg)  12/28/16 192 lb 8 oz (87.3 kg)  12/17/16 195 lb (88.5 kg)     Studies/Labs Reviewed:   EKG:  EKG is not ordered today.    Recent Labs: 07/01/2016: Magnesium 2.2 11/26/2016: ALT 15; BUN 12; Creatinine, Ser 0.83; Hemoglobin 11.1; Platelets 299.0; Potassium 4.2; Sodium 139; TSH 3.22   Lipid Panel    Component Value Date/Time   CHOL 196 11/26/2016 0832   TRIG 116.0 11/26/2016 0832   HDL 39.90 11/26/2016 0832   CHOLHDL 5 11/26/2016 0832   VLDL 23.2 11/26/2016 0832   LDLCALC 133 (H) 11/26/2016 0832    Additional studies/ records that were reviewed today include:   ETT: 07/2016  Blood  pressure demonstrated a hypertensive response to exercise.  No T wave inversion was noted during stress.  Horizontal ST segment depression ST segment depression of 2 mm was noted during stress in the II, III, aVF, V6, V5 and V4 leads, and returning to baseline after 1-5 minutes of recovery.  Blood pressure demonstrated a hypertensive response to exercise.  Heart rate demonstrated an exaggerated response to exercise  Overall, the patient's exercise capacity was normal.  D[facility] Treadmill Score: intermediate risk   Borderline abnormal ETT with 2 mm horizontal to upsloping ST segment depression inferiorly and laterally. Given the finding of delta waves suggestive of WPW, the findings are more consistent with pre-excitation and not likely ischemia.   Echocardiogram: 08/2016 Study Conclusions  - Left ventricle: The cavity size was normal. Systolic function was   normal. The estimated ejection fraction was in the range of 55%   to 60%. Wall motion was normal; there were no regional wall   motion abnormalities. Left ventricular diastolic function   parameters were normal.  Assessment:    1. Wolff-Parkinson-White (WPW) pattern   2. Atypical chest pain     3. Hypothyroidism, unspecified type      Plan:   In order of problems listed above:  1. WPW Pattern - this was noticed on her EKG in 07/2016. She underwent an ETT which showed EKG changes reflective of WPW waveforms and no significant evidence of heart block. Echo showed an EF of 55-60% with no significant WMA or valve abnormalities.  - she denies any recent chest pain, dyspnea, palpitations, dizziness, or presyncope. Reviewed the etiology of WPW with the patient and she will make our office aware if she develops any concerning symptoms as she would need EP referral at that time.   2. Atypical Chest Pain - occurred in 07/2016. ETT without acute changes and echo showed a preserved EF at that time.  - she denies any recurrent symptoms. Has ben exercising multiple times per week without any anginal symptoms. No further testing is indicated at this time.   3. Hypothyroidism - remains on Synthroid 150 mcg daily. Followed by PCP.    Medication Adjustments/Labs and Tests Ordered: Current medicines are reviewed at length with the patient today.  Concerns regarding medicines are outlined above.  Medication changes, Labs and Tests ordered today are listed in the Patient Instructions below. Patient Instructions  Medication Instructions: Your physician recommends that you continue on your current medications as directed. Please refer to the Current Medication list given to you today.  Follow-Up: Your physician wants you to follow-up in: 12 months with Dr. Stanford Breed. You will receive a reminder letter in the mail two months in advance. If you don't receive a letter, please call our office to schedule the follow-up appointment.  If you need a refill on your cardiac medications before your next appointment, please call your pharmacy.   Signed, Erma Heritage, PA-C  04/26/2017 7:25 PM    [facility] [address], [place], [place]  [address] Phone: [phone]; Fax: [phone]  [address] [place], [place] [address] Phone: [phone]

## 2017-04-26 ENCOUNTER — Encounter: Payer: Self-pay | Admitting: Student

## 2017-04-26 ENCOUNTER — Ambulatory Visit (INDEPENDENT_AMBULATORY_CARE_PROVIDER_SITE_OTHER): Payer: BLUE CROSS/BLUE SHIELD | Admitting: Student

## 2017-04-26 VITALS — BP 120/82 | HR 66 | Ht 69.0 in | Wt 190.6 lb

## 2017-04-26 DIAGNOSIS — I456 Pre-excitation syndrome: Secondary | ICD-10-CM

## 2017-04-26 DIAGNOSIS — R0789 Other chest pain: Secondary | ICD-10-CM | POA: Diagnosis not present

## 2017-04-26 DIAGNOSIS — E039 Hypothyroidism, unspecified: Secondary | ICD-10-CM

## 2017-04-26 NOTE — Patient Instructions (Signed)
Medication Instructions: Your physician recommends that you continue on your current medications as directed. Please refer to the Current Medication list given to you today.  Follow-Up: Your physician wants you to follow-up in: 6 months with Dr. Stanford Breed. You will receive a reminder letter in the mail two months in advance. If you don't receive a letter, please call our office to schedule the follow-up appointment.  If you need a refill on your cardiac medications before your next appointment, please call your pharmacy.

## 2017-05-20 ENCOUNTER — Ambulatory Visit: Payer: Managed Care, Other (non HMO) | Admitting: Cardiology

## 2017-09-01 DIAGNOSIS — Z6828 Body mass index (BMI) 28.0-28.9, adult: Secondary | ICD-10-CM | POA: Diagnosis not present

## 2017-09-01 DIAGNOSIS — Z1231 Encounter for screening mammogram for malignant neoplasm of breast: Secondary | ICD-10-CM | POA: Diagnosis not present

## 2017-09-01 DIAGNOSIS — Z01419 Encounter for gynecological examination (general) (routine) without abnormal findings: Secondary | ICD-10-CM | POA: Diagnosis not present

## 2017-09-01 DIAGNOSIS — Z124 Encounter for screening for malignant neoplasm of cervix: Secondary | ICD-10-CM | POA: Diagnosis not present

## 2017-09-01 DIAGNOSIS — D509 Iron deficiency anemia, unspecified: Secondary | ICD-10-CM | POA: Diagnosis not present

## 2017-09-01 DIAGNOSIS — Z1389 Encounter for screening for other disorder: Secondary | ICD-10-CM | POA: Diagnosis not present

## 2017-09-01 DIAGNOSIS — N920 Excessive and frequent menstruation with regular cycle: Secondary | ICD-10-CM | POA: Diagnosis not present

## 2017-09-01 DIAGNOSIS — Z1151 Encounter for screening for human papillomavirus (HPV): Secondary | ICD-10-CM | POA: Diagnosis not present

## 2017-09-01 DIAGNOSIS — Z130 Encounter for screening for diseases of the blood and blood-forming organs and certain disorders involving the immune mechanism: Secondary | ICD-10-CM | POA: Diagnosis not present

## 2017-09-01 LAB — HM PAP SMEAR

## 2017-09-01 LAB — HM MAMMOGRAPHY

## 2017-09-18 ENCOUNTER — Other Ambulatory Visit: Payer: Self-pay | Admitting: Neurology

## 2017-10-09 ENCOUNTER — Other Ambulatory Visit: Payer: Self-pay | Admitting: Internal Medicine

## 2017-10-09 DIAGNOSIS — J4540 Moderate persistent asthma, uncomplicated: Secondary | ICD-10-CM

## 2017-10-19 ENCOUNTER — Ambulatory Visit: Payer: BLUE CROSS/BLUE SHIELD | Admitting: Internal Medicine

## 2017-10-20 ENCOUNTER — Encounter: Payer: Self-pay | Admitting: Internal Medicine

## 2017-10-20 ENCOUNTER — Ambulatory Visit (INDEPENDENT_AMBULATORY_CARE_PROVIDER_SITE_OTHER): Payer: BLUE CROSS/BLUE SHIELD | Admitting: Internal Medicine

## 2017-10-20 VITALS — BP 110/78 | HR 73 | Ht 69.0 in | Wt 188.0 lb

## 2017-10-20 DIAGNOSIS — J4540 Moderate persistent asthma, uncomplicated: Secondary | ICD-10-CM | POA: Diagnosis not present

## 2017-10-20 NOTE — Patient Instructions (Signed)
Continue symbicort 2 puffs first thing in am and then again if needed 12 hours  Work on inhaler technique:  relax and gently blow all the way out then take a nice smooth deep breath back in, triggering the inhaler at same time you start breathing in.  Hold for up to 5 seconds if you can. Blow out thru nose. Rinse and gargle with water when done    Only use your albuterol as a rescue medication to be used if you can't catch your breath by resting or doing a relaxed purse lip breathing pattern.  - The less you use it, the better it will work when you need it. - Ok to use up to 2 puffs  every 4 hours if you must but call for immediate appointment if use goes up over your usual need - Don't leave home without it !!  (think of it like the spare tire for your car)    Please schedule a follow up visit in 12  months but call sooner if needed

## 2017-10-20 NOTE — Progress Notes (Signed)
Subjective:    Patient ID: Rebecca Silva, female    DOB: 02-05-1971   MRN: [id number]    Brief patient profile:  89 yowf never smoker with tendency to bronchitis all her life sev times a year never required prednisone or breathing treatments until mid 24s but started then on prn saba  But never needed maint inhaler or more than twice weekly saba but since Dec 2014 needed it more often as much as twice daily then admitted:   Admit date: 06/06/2014 Discharge date: 06/08/2014   Discharge Diagnoses:  Principal Problem:  Asthma exacerbation   Hypothyroidism  Leukocytosis  Viral URI with cough  Tachycardia    History of Present Illness  06/20/2014 1st [facility] office visit/ Rebecca Silva  / referred by [facility] after above admit  Chief Complaint  Patient presents with  . Pulmonary Consult    Self referral. Pt states dxed with Asthma approx 10 yrs ago. She c/o SOB and cough since mid Nov 2015.  Her cough is prod with minimal yellow sputum.  She is using rescue inhaler on average 2 x per day.     much better since admit but note over use of saba at baseline and still doe x exertion but not at rest/ sleeping or adls rec Plan A = automatic = qvar 80 Take 2 puffs first thing in am and then another 2 puffs about 12 hours later (other option is dulera but probably don't need it)  Plan B = backup saba hfa Plan C= Crisis = nebulizer if plan A and B don't work  Plan D = Doctor, call me if not satisfied with the above       11/01/2014 f/u ov/Rebecca Silva re: moderate persistent asthma  qvar 80 2 bid / more sob than cough  Chief Complaint  Patient presents with  . Follow-up    PFT done today. Pt states that her breathing is unchanged since her last visit. She uses rescue inhaler 1-2 x per wk. She c/o Qvar sometimes making her feel that she is about to have an asthma attack.   marked decrease in need for saba since starting qvar / no noct flares  rec Change qvar to symbicort 160 Take 2 puffs  first thing in am and then another 2 puffs about 12 hours later.  Only use your albuterol as a rescue medication     02/08/2015 f/u ov/Rebecca Silva re: chronic asthma  Chief Complaint  Patient presents with  . Follow-up    Pt states breathing is doing well. She uses albuterol about once per month.   Not limited by breathing from desired activities   If the drugstore doesn't honor your zero coupon please let me know  Continue symbicort 160 Take 2 puffs first thing in am and then another 2 puffs about 12 hours later.  Only use your albuterol as a rescue medication     06/10/2016  f/u ov/Rebecca Silva re: chronic asthma  As long as takes symb 160 regularly has done great x > 1 year with min saba use despite suboptimal hfa (see a/p)  rec Try symbicort 80 Take 2 puffs first thing in am and then another 2 puffs about 12 hours later.  Work on inhaler technique:  Only use your albuterol as a rescue medication    10/20/2017  f/u ov/Rebecca Silva re: chronic asthma  Chief Complaint  Patient presents with  . Follow-up    Breathing is doing well. She rarely uses her albuterol inhaler and neb.  Dyspnea:  Running up to 30 min ok  Cough: no Sleep: fine SABA use:  Not even once  A week        No obvious day to day or daytime variability or assoc excess/ purulent sputum or mucus plugs or hemoptysis or cp or chest tightness, subjective wheeze or overt sinus or hb symptoms. No unusual exposure hx or h/o childhood pna/ asthma or knowledge of premature birth.  Sleeping ok flat without nocturnal  or early am exacerbation  of respiratory  c/o's or need for noct saba. Also denies any obvious fluctuation of symptoms with weather or environmental changes or other aggravating or alleviating factors except as outlined above   Current Allergies, Complete Past Medical History, Past Surgical History, Family History, and Social History were reviewed in [employer] record.  ROS  The following are not active  complaints unless bolded Hoarseness, sore throat, dysphagia, dental problems, itching, sneezing,  nasal congestion or discharge of excess mucus or purulent secretions, ear ache,   fever, chills, sweats, unintended wt loss or wt gain, classically pleuritic or exertional cp,  orthopnea pnd or leg swelling, presyncope, palpitations, abdominal pain, anorexia, nausea, vomiting, diarrhea  or change in bowel habits or change in bladder habits, change in stools or change in urine, dysuria, hematuria,  rash, arthralgias, visual complaints, headache, numbness, weakness or ataxia or problems with walking or coordination,  change in mood/affect or memory.        Current Meds  Medication Sig  . albuterol (PROVENTIL) (2.5 MG/3ML) 0.083% nebulizer solution Take 3 mLs (2.5 mg total) by nebulization every 6 (six) hours as needed for wheezing or shortness of breath.  . budesonide-formoterol (SYMBICORT) 80-4.5 MCG/ACT inhaler Take 2 puffs first thing in am and then another 2 puffs about 12 hours later.  . Cholecalciferol (VITAMIN D) 2000 units tablet Take 1 tablet by mouth daily. Reported on 12/31/2015  . levETIRAcetam (KEPPRA) 750 MG tablet TAKE (2) TABLETS BY MOUTH TWICE DAILY.  Marland Kitchen PROAIR HFA 108 (90 Base) MCG/ACT inhaler INHALE 2 PUFFS EVERY 4 HOURS AS NEEDED. ONLY IF YOU CAN'T CATCH YOUR BREATH.  . SYNTHROID 150 MCG tablet TAKE ONE TABLET BY MOUTH EVERY MORNING               Objective:   Physical Exam  amb wf nad   07/24/2014          200  > 11/01/2014  199 >  02/08/2015 198 > 06/10/2016   194 >  10/20/2017   188    06/20/14 199 lb (90.266 kg)  06/06/14 196 lb (88.905 kg)  06/06/14 196 lb (88.905 kg)        Vital signs reviewed - Note on arrival 02 sats  100% on RA   HEENT: nl dentition, turbinates bilaterally, and oropharynx. Nl external ear canals without cough reflex   NECK :  without JVD/Nodes/TM/ nl carotid upstrokes bilaterally   LUNGS: no acc muscle use,  Nl contour chest which is clear to  A and P bilaterally without cough on insp or exp maneuvers   CV:  RRR  no s3 or murmur or increase in P2, and no edema   ABD:  soft and nontender with nl inspiratory excursion in the supine position. No bruits or organomegaly appreciated, bowel sounds nl  MS:  Nl gait/ ext warm without deformities, calf tenderness, cyanosis or clubbing No obvious joint restrictions   SKIN: warm and dry without lesions    NEURO:  alert,  approp, nl sensorium with  no motor or cerebellar deficits apparent.                        Assessment & Plan:

## 2017-10-21 ENCOUNTER — Encounter: Payer: Self-pay | Admitting: Internal Medicine

## 2017-10-21 ENCOUNTER — Encounter: Payer: Self-pay | Admitting: Endocrinology

## 2017-10-21 ENCOUNTER — Ambulatory Visit (INDEPENDENT_AMBULATORY_CARE_PROVIDER_SITE_OTHER): Payer: BLUE CROSS/BLUE SHIELD | Admitting: Endocrinology

## 2017-10-21 VITALS — BP 120/72 | HR 72 | Wt 190.0 lb

## 2017-10-21 DIAGNOSIS — E038 Other specified hypothyroidism: Secondary | ICD-10-CM | POA: Diagnosis not present

## 2017-10-21 DIAGNOSIS — E063 Autoimmune thyroiditis: Secondary | ICD-10-CM | POA: Diagnosis not present

## 2017-10-21 DIAGNOSIS — E559 Vitamin D deficiency, unspecified: Secondary | ICD-10-CM | POA: Diagnosis not present

## 2017-10-21 NOTE — Patient Instructions (Addendum)
blood tests are requested for you today.  We'll let you know about the results. Please come back for a follow-up appointment in 1 year.   

## 2017-10-21 NOTE — Assessment & Plan Note (Signed)
-   07/24/2014   > trial of qvar 80 2bid - PFTs 11/01/2014 FEV1  2.36 (69%) ratio 74 p 33% improvement from saba/ dlco 80% -  11/01/2014 p extensive coaching HFA effectiveness =    75% > try symbicort 160 2bid > marked clinical  improvement 02/08/15  -  06/10/2016    try symb 80 2bid > all symptoms resolved    - The proper method of use, as well as anticipated side effects, of a metered-dose inhaler are discussed and demonstrated to the patient. Improved effectiveness after extensive coaching during this visit to a level of approximately 90 % from a baseline of 75 % > continue symb 80 2 each am and pm dose optional  Based on the study from [place]  378; 20 p 1865 (2018) in pts with mild asthma it is reasonable to use low dose symbicort eg 80 2bid "prn" flare in this setting but I emphasized this was only shown with symbicort and takes advantage of the rapid onset of action but is not the same as "rescue therapy" but can be stopped once the acute symptoms have resolved and the need for rescue has been minimized (< 2 x weekly)     I had an extended discussion with the patient reviewing all relevant studies completed to date and  lasting 15 to 20 minutes of a 25 minute visit    Each maintenance medication was reviewed in detail including most importantly the difference between maintenance and prns and under what circumstances the prns are to be triggered using an action plan format that is not reflected in the computer generated alphabetically organized AVS.    Please see AVS for specific instructions unique to this visit that I personally wrote and verbalized to the the pt in detail and then reviewed with pt  by my nurse highlighting any  changes in therapy recommended at today's visit to their plan of care.    F/u can be yearly as long as following above guidlelines including rule of two's reviewed

## 2017-10-21 NOTE — Progress Notes (Signed)
Subjective:    Patient ID: Rebecca Silva, female    DOB: 02-28-1971, 47 y.o.   MRN: [id number]  HPI Pt returns for f/u of primary hypothyroidism (dx'ed 2003; she has been on thyroid hormone supplement since then; she has been on the current dosage of 150 mcg/day, since late 2015; this is the highest dosage she has ever taken; [place] in 2013 and 2017 showed heterogeneous tissue, but no nodule).  She takes synthroid 150/d, as rx'ed.  Hypovitaminosis-D: pt states she feels well in general, except she still has intermittent muscle cramps.  She takes 2000 units/d.  Past Medical History:  Diagnosis Date  . Asthma   . Heart murmur   . History of UTI   . Hypothyroidism   . Seasonal allergies   . Seizure disorder ([place])   . Urine incontinence   . Wolff-Parkinson-White pattern    a. noted on EKG in 07/2016. Echo showed a preserved EF of 55-60% with no regional WMA. ETT showed WPW waveforms with no evidence of HB.     History reviewed. No pertinent surgical history.  Social History   Socioeconomic History  . Marital status: Married    Spouse name: Not on file  . Number of children: Not on file  . Years of education: Not on file  . Highest education level: Not on file  Occupational History  . Occupation: Radio broadcast assistant   Social Needs  . Financial resource strain: Not on file  . Food insecurity:    Worry: Not on file    Inability: Not on file  . Transportation needs:    Medical: Not on file    Non-medical: Not on file  Tobacco Use  . Smoking status: Never Smoker  . Smokeless tobacco: Never Used  Substance and Sexual Activity  . Alcohol use: No    Alcohol/week: 0.0 oz  . Drug use: No  . Sexual activity: Yes    Partners: Male  Lifestyle  . Physical activity:    Days per week: Not on file    Minutes per session: Not on file  . Stress: Not on file  Relationships  . Social connections:    Talks on phone: Not on file    Gets together: Not on file    Attends religious service: Not on  file    Active member of club or organization: Not on file    Attends meetings of clubs or organizations: Not on file    Relationship status: Not on file  . Intimate partner violence:    Fear of current or ex partner: Not on file    Emotionally abused: Not on file    Physically abused: Not on file    Forced sexual activity: Not on file  Other Topics Concern  . 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 albuterol (PROVENTIL) (2.5 MG/3ML) 0.083% nebulizer solution Take 3 mLs (2.5 mg total) by nebulization every 6 (six) hours as needed for wheezing or shortness of breath. 75 mL 0  . Cholecalciferol (VITAMIN D) 2000 units tablet Take 1 tablet by mouth daily. Reported on 12/31/2015    . levETIRAcetam (KEPPRA) 750 MG tablet TAKE (2) TABLETS BY MOUTH TWICE DAILY. 120 tablet  3  . PROAIR HFA 108 (90 Base) MCG/ACT inhaler INHALE 2 PUFFS EVERY 4 HOURS AS NEEDED. ONLY IF YOU CAN'T CATCH YOUR BREATH. 8.5 g 0   No current facility-administered medications on file prior to visit.     No Known Allergies  Family History  Problem Relation Age of Onset  . Heart disease Mother 72       open heart surgery  . Heart disease Maternal Grandmother   . Epilepsy Maternal Grandfather     BP 120/72 (BP Location: Left Arm, Patient Position: Sitting, Cuff Size: Normal)   Pulse 72   Wt 190 lb (86.2 kg)   SpO2 97%   BMI 28.06 kg/m    Review of Systems Denies neck pain.      Objective:   Physical Exam VITAL SIGNS:  See vs page GENERAL: no distress NECK: There is no palpable thyroid enlargement.  No thyroid nodule is palpable.  No palpable lymphadenopathy at the anterior neck.      Assessment & Plan:  Hypothyroidism: due for recheck Vit-D def: due for  recheck  Patient Instructions  blood tests are requested for you today.  We'll let you know about the results.  Please come back for a follow-up appointment in 1 year.

## 2017-10-22 ENCOUNTER — Other Ambulatory Visit: Payer: Self-pay | Admitting: Internal Medicine

## 2017-10-22 ENCOUNTER — Other Ambulatory Visit: Payer: Self-pay | Admitting: Endocrinology

## 2017-10-22 DIAGNOSIS — J4540 Moderate persistent asthma, uncomplicated: Secondary | ICD-10-CM

## 2017-11-12 ENCOUNTER — Encounter: Payer: Self-pay | Admitting: Neurology

## 2017-11-25 ENCOUNTER — Ambulatory Visit (INDEPENDENT_AMBULATORY_CARE_PROVIDER_SITE_OTHER): Payer: BLUE CROSS/BLUE SHIELD | Admitting: Internal Medicine

## 2017-11-25 ENCOUNTER — Encounter: Payer: Self-pay | Admitting: Internal Medicine

## 2017-11-25 ENCOUNTER — Other Ambulatory Visit (INDEPENDENT_AMBULATORY_CARE_PROVIDER_SITE_OTHER): Payer: BLUE CROSS/BLUE SHIELD

## 2017-11-25 VITALS — BP 112/78 | HR 65 | Temp 98.1°F | Ht 69.0 in | Wt 189.0 lb

## 2017-11-25 DIAGNOSIS — E063 Autoimmune thyroiditis: Secondary | ICD-10-CM | POA: Diagnosis not present

## 2017-11-25 DIAGNOSIS — Z0000 Encounter for general adult medical examination without abnormal findings: Secondary | ICD-10-CM

## 2017-11-25 DIAGNOSIS — E038 Other specified hypothyroidism: Secondary | ICD-10-CM | POA: Diagnosis not present

## 2017-11-25 DIAGNOSIS — J4540 Moderate persistent asthma, uncomplicated: Secondary | ICD-10-CM | POA: Diagnosis not present

## 2017-11-25 LAB — LIPID PANEL
Cholesterol: 179 mg/dL (ref 0–200)
HDL: 34.6 mg/dL — AB (ref >39.00)
LDL Cholesterol: 132 mg/dL — ABNORMAL HIGH (ref 0–99)
NonHDL: 144.3
Total CHOL/HDL Ratio: 5
Triglycerides: 63 mg/dL (ref 0.0–149.0)
VLDL: 12.6 mg/dL (ref 0.0–40.0)

## 2017-11-25 LAB — COMPREHENSIVE METABOLIC PANEL
ALK PHOS: 47 U/L (ref 39–117)
ALT: 9 U/L (ref 0–35)
AST: 14 U/L (ref 0–37)
Albumin: 4.1 g/dL (ref 3.5–5.2)
BILIRUBIN TOTAL: 0.4 mg/dL (ref 0.2–1.2)
BUN: 9 mg/dL (ref 6–23)
CALCIUM: 8.7 mg/dL (ref 8.4–10.5)
CO2: 28 mEq/L (ref 19–32)
CREATININE: 0.82 mg/dL (ref 0.40–1.20)
Chloride: 107 mEq/L (ref 96–112)
GFR: 78.69 mL/min (ref >60.00)
GLUCOSE: 86 mg/dL (ref 70–99)
Potassium: 4.5 mEq/L (ref 3.5–5.1)
Sodium: 140 mEq/L (ref 135–145)
TOTAL PROTEIN: 6.9 g/dL (ref 6.0–8.3)

## 2017-11-25 LAB — TSH: TSH: 1.8 u[IU]/mL (ref 0.35–4.50)

## 2017-11-25 LAB — CBC
HCT: 34.7 % — ABNORMAL LOW (ref 36.0–46.0)
HEMOGLOBIN: 11.2 g/dL — AB (ref 12.0–15.0)
MCHC: 32.3 g/dL (ref 30.0–36.0)
MCV: 74.2 fl — ABNORMAL LOW (ref 78.0–100.0)
PLATELETS: 263 10*3/uL (ref 150.0–400.0)
RBC: 4.68 Mil/uL (ref 3.87–5.11)
RDW: 17.8 % — AB (ref 11.5–15.5)
WBC: 7.6 10*3/uL (ref 4.0–10.5)

## 2017-11-25 LAB — VITAMIN B12: VITAMIN B 12: 409 pg/mL (ref 211–911)

## 2017-11-25 LAB — VITAMIN D 25 HYDROXY (VIT D DEFICIENCY, FRACTURES): VITD: 23.61 ng/mL — ABNORMAL LOW (ref 30.00–100.00)

## 2017-11-25 LAB — T4, FREE: Free T4: 1.23 ng/dL (ref 0.60–1.60)

## 2017-11-25 LAB — FERRITIN: Ferritin: 3.5 ng/mL — ABNORMAL LOW (ref 10.0–291.0)

## 2017-11-25 LAB — HEMOGLOBIN A1C: Hgb A1c MFr Bld: 5.6 % (ref 4.6–6.5)

## 2017-11-25 NOTE — Patient Instructions (Signed)

## 2017-11-25 NOTE — Assessment & Plan Note (Signed)
Mammogram and pap smear up to date with gyn and getting records. Reminded about yearly flu shot. Tetanus up to date. Counseled about sun safety and mole surveillance as well as dangers of distracted driving. Given screening recommendations.

## 2017-11-25 NOTE — Progress Notes (Signed)
   Subjective:    Patient ID: Rebecca Silva, female    DOB: 1971/05/31, 47 y.o.   MRN: [id number]  HPI The patient is a 47 YO female coming in for physical.   PMH, [facility], social history reviewed and updated.   Review of Systems  Constitutional: Negative.   HENT: Negative.   Eyes: Negative.   Respiratory: Negative for cough, chest tightness and shortness of breath.   Cardiovascular: Negative for chest pain, palpitations and leg swelling.  Gastrointestinal: Negative for abdominal distention, abdominal pain, constipation, diarrhea, nausea and vomiting.  Musculoskeletal: Negative.   Skin: Negative.   Neurological: Negative.   Psychiatric/Behavioral: Negative.       Objective:   Physical Exam  Constitutional: She is oriented to person, place, and time. She appears well-developed and well-nourished.  HENT:  Head: Normocephalic and atraumatic.  Eyes: EOM are normal.  Neck: Normal range of motion.  Cardiovascular: Normal rate and regular rhythm.  Pulmonary/Chest: Effort normal and breath sounds normal. No respiratory distress. She has no wheezes. She has no rales.  Abdominal: Soft. Bowel sounds are normal. She exhibits no distension. There is no tenderness. There is no rebound.  Musculoskeletal: She exhibits no edema.  Neurological: She is alert and oriented to person, place, and time. Coordination normal.  Skin: Skin is warm and dry.  Psychiatric: She has a normal mood and affect.   Vitals:   11/25/17 0906  BP: 112/78  Pulse: 65  Temp: 98.1 F (36.7 C)  TempSrc: Oral  SpO2: 99%  Weight: 189 lb (85.7 kg)  Height: 5\' 9"  (1.753 m)      Assessment & Plan:

## 2017-11-25 NOTE — Assessment & Plan Note (Signed)
Counseled she can reduce symbicort to daily and albuterol prn since no flares in several years.

## 2017-11-25 NOTE — Progress Notes (Signed)
Abstracted and sent to scan  

## 2017-11-25 NOTE — Assessment & Plan Note (Signed)
Checking TSH and free T4 and adjust synthroid 150 mcg daily as needed.  

## 2017-12-01 ENCOUNTER — Encounter: Payer: Self-pay | Admitting: Internal Medicine

## 2017-12-01 ENCOUNTER — Ambulatory Visit (HOSPITAL_COMMUNITY)
Admission: EM | Admit: 2017-12-01 | Discharge: 2017-12-01 | Disposition: A | Discharge status: Home / Self Care | Payer: BLUE CROSS/BLUE SHIELD | Attending: Family Medicine | Admitting: Family Medicine

## 2017-12-01 ENCOUNTER — Encounter (HOSPITAL_COMMUNITY): Payer: Self-pay | Admitting: Emergency Medicine

## 2017-12-01 ENCOUNTER — Ambulatory Visit: Payer: Self-pay | Admitting: *Deleted

## 2017-12-01 ENCOUNTER — Encounter: Payer: Self-pay | Admitting: Endocrinology

## 2017-12-01 DIAGNOSIS — R0789 Other chest pain: Secondary | ICD-10-CM | POA: Diagnosis not present

## 2017-12-01 DIAGNOSIS — M542 Cervicalgia: Secondary | ICD-10-CM

## 2017-12-01 NOTE — Discharge Instructions (Addendum)
Please monitor your symptoms over the next 12-24 hours, if you have any worsening, increase in frequency, worsening nausea, dizziness, lightheadedness, numbness, shortness of breath please go to emergency room.  You may try tylenol for neck pain

## 2017-12-01 NOTE — ED Triage Notes (Signed)
PT reports central chest pressure and neck stiffness that began last night. PT reports chest pressure worsens with movement like opening a door. Nausea started within last hour. No cardiac history.

## 2017-12-01 NOTE — ED Provider Notes (Signed)
[place]    CSN: [id number] Arrival date & time: 12/01/17  1403     History   Chief Complaint Chief Complaint  Patient presents with  . Chest Pain  . Torticollis    HPI Rebecca Silva is a 47 y.o. female history of Wolff-Parkinson-White, seizures, hypothyroidism, asthma presenting today for evaluation of chest pain and neck pain.  Patient states that last night she began to feel tenderness near her thyroid which then developed into a central chest pressure sensation.  Since she has had this pressure sensation off and on, will last for a few minutes and then will go away.  She is also felt like her neck has become increasingly stiff.  Denies any fevers, denies any coughing or shortness of breath.  Denies recently being sick.  Patient did have some nausea approximately 30 minutes before arrival to the urgent care.  She denies chest discomfort at time of interview.  Chest discomfort is not related to exertion or eating.  Denies abdominal pain.  Patient was seen 1 week ago for her annual physical and TSH was within normal limits.  Patient denies history of hypertension, diabetes, smoking.  Denies family history of dying from an early age from an MI.  Does note that her mother had open heart surgery at age 62.  HPI  Past Medical History:  Diagnosis Date  . Asthma   . Heart murmur   . History of UTI   . Hypothyroidism   . Seasonal allergies   . Seizure disorder ([place])   . Urine incontinence   . Wolff-Parkinson-White pattern    a. noted on EKG in 07/2016. Echo showed a preserved EF of 55-60% with no regional WMA. ETT showed WPW waveforms with no evidence of HB.     Patient Active Problem List   Diagnosis Date Noted  . Allergic rhinitis 12/18/2016  . Wolff-Parkinson-White (WPW) pattern 07/30/2016  . Routine general medical examination at a health care facility 04/15/2015  . Generalized idiopathic epilepsy, not intractable, without status epilepticus ([place]) 03/18/2015  .  Vitamin D deficiency 10/10/2014  . Moderate persistent asthma without complication 77/93/9030  . Hypothyroidism 05/19/2013    History reviewed. No pertinent surgical history.  OB History   None      Home Medications    Prior to Admission medications   Medication Sig Start Date End Date Taking? Authorizing Provider  albuterol (PROVENTIL) (2.5 MG/3ML) 0.083% nebulizer solution Take 3 mLs (2.5 mg total) by nebulization every 6 (six) hours as needed for wheezing or shortness of breath. 10/08/15   Hoyt Koch, MD  budesonide-formoterol (SYMBICORT) 80-4.5 MCG/ACT inhaler INHALE 2 PUFFS FIRST THING IN THE MORNING AND THEN ANOTHER 2 PUFFS ABOUT 12 HOURS LATER. 10/22/17   Tanda Rockers, MD  Cholecalciferol (VITAMIN D) 2000 units tablet Take 1 tablet by mouth daily. Reported on 12/31/2015    [provider]  levETIRAcetam (KEPPRA) 750 MG tablet TAKE (2) TABLETS BY MOUTH TWICE DAILY. 09/20/17   Jaffe, Adam R, DO  PROAIR HFA 108 (90 Base) MCG/ACT inhaler INHALE 2 PUFFS EVERY 4 HOURS AS NEEDED. ONLY IF YOU CAN'T CATCH YOUR BREATH. 03/15/17   Tanda Rockers, MD  SYNTHROID 150 MCG tablet TAKE ONE TABLET BY MOUTH EVERY MORNING 10/23/17   Renato Shin, MD    Family History Family History  Problem Relation Age of Onset  . Heart disease Mother 39       open heart surgery  . Heart disease Maternal Grandmother   .  Epilepsy Maternal Grandfather     Social History Social History   Tobacco Use  . Smoking status: Never Smoker  . Smokeless tobacco: Never Used  Substance Use Topics  . Alcohol use: No    Alcohol/week: 0.0 oz  . Drug use: No     Allergies   Patient has no known allergies.   Review of Systems Review of Systems  Constitutional: Negative for activity change and appetite change.  HENT: Negative for trouble swallowing.   Eyes: Negative for pain and visual disturbance.  Respiratory: Negative for shortness of breath.   Cardiovascular: Positive for chest pain.    Gastrointestinal: Positive for nausea. Negative for abdominal pain and vomiting.  Musculoskeletal: Positive for myalgias, neck pain and neck stiffness. Negative for arthralgias, back pain and gait problem.  Skin: Negative for color change and wound.  Neurological: Negative for dizziness, seizures, syncope, weakness, light-headedness, numbness and headaches.     Physical Exam Triage Vital Signs ED Triage Vitals  Enc Vitals Group     BP 12/01/17 1418 122/60     Pulse Rate 12/01/17 1418 67     Resp 12/01/17 1418 16     Temp 12/01/17 1418 99.2 F (37.3 C)     Temp Source 12/01/17 1418 Oral     SpO2 12/01/17 1418 100 %     Weight 12/01/17 1417 189 lb (85.7 kg)     Height --      Head Circumference --      Peak Flow --      Pain Score 12/01/17 1417 6     Pain Loc --      Pain Edu? --      Excl. in [place]? --    No data found.  Updated Vital Signs BP 122/60   Pulse 67   Temp 99.2 F (37.3 C) (Oral)   Resp 16   Wt 189 lb (85.7 kg)   LMP 11/26/2017   SpO2 100%   BMI 27.91 kg/m   Visual Acuity Right Eye Distance:   Left Eye Distance:   Bilateral Distance:    Right Eye Near:   Left Eye Near:    Bilateral Near:     Physical Exam  Constitutional: She is oriented to person, place, and time. She appears well-developed and well-nourished. No distress.  HENT:  Head: Normocephalic and atraumatic.  Mouth/Throat: Oropharynx is clear and moist.  Eyes: Pupils are equal, round, and reactive to light. Conjunctivae and EOM are normal.  Neck: Neck supple.  Cardiovascular: Normal rate and regular rhythm.  No murmur heard. Pulmonary/Chest: Effort normal and breath sounds normal. No respiratory distress.  Breathing comfortably at rest, CTABL, no wheezing, rales or other adventitious sounds auscultated Mild tenderness to palpation of left sternal area  Abdominal: Soft. There is no tenderness.  Musculoskeletal: She exhibits no edema.  Neurological: She is alert and oriented to person,  place, and time.  Skin: Skin is warm and dry.  Psychiatric: She has a normal mood and affect.  Nursing note and vitals reviewed.    UC Treatments / Results  Labs (all labs ordered are listed, but only abnormal results are displayed) Labs Reviewed - No data to display  EKG None  Radiology No results found.  Procedures Procedures (including critical care time)  Medications Ordered in UC Medications - No data to display  Initial Impression / Assessment and Plan / UC Course  I have reviewed the triage vital signs and the nursing notes.  Pertinent labs & imaging  results that were available during my care of the patient were reviewed by me and considered in my medical decision making (see chart for details).     Patient with atypical chest pain, vital signs stable.  Patient not having chest pain at time of visit.  EKG showing normal sinus rhythm, delta wave present from Wolff-Parkinson-White.  Otherwise no acute changes from previous EKG, no signs of ischemia or infarction.  Patient with negative risk factors for MI.  Low risk for PE.  Gave patient the option of going to emergency room versus monitoring symptoms have been the next 12 to 24 hours.  Patient opted to monitor given she is not having it now.  Advised to go to emergency room if she has any worsening of symptoms, becoming more frequent or developing any new symptoms along with a chest pressure. Discussed strict return precautions. Patient verbalized understanding and is agreeable with plan.  Final Clinical Impressions(s) / UC Diagnoses   Final diagnoses:  Atypical chest pain  Neck pain     Discharge Instructions     Please monitor your symptoms over the next 12-24 hours, if you have any worsening, increase in frequency, worsening nausea, dizziness, lightheadedness, numbness, shortness of breath please go to emergency room.  You may try tylenol for neck pain    ED Prescriptions    None     Controlled Substance  Prescriptions [place] Controlled Substance Registry consulted? Not Applicable   Janith Lima, [place] 12/01/17 1518

## 2017-12-01 NOTE — Telephone Encounter (Signed)
Pt having complaints of feeling heaviness and pressure in the middle of her chest since last night. Pt states that the feeling comes and goes and when she it does come it last between 1-2 minutes. Pt states when she does activity, such as opening a door she feels the pressure more.  Pt states she wouldn't describe the feeling as pain just discomfort and would rate it at 5 on a scale on 1-10. Pt also states that she has pain in her neck around the thyroid.Pt denies any shortness of breath or dizziness at this time. Advised pt to seek treatment in the ED for current symptoms. Pt verbalized understanding.  Reason for Disposition . [1] Intermittent  chest pain or "angina" AND [2] increasing in severity or frequency  (Exception: pains lasting a few seconds)  Answer Assessment - Initial Assessment Questions 1. LOCATION: "Where does it hurt?"       Middle of chest 2. RADIATION: "Does the pain go anywhere else?" (e.g., into neck, jaw, arms, back)     Goes into neck  3. ONSET: "When did the chest pain begin?" (Minutes, hours or days)      Last night 4. PATTERN "Does the pain come and go, or has it been constant since it started?"  "Does it get worse with exertion?"      Comes and goes, notice pressure more with exertion 5. DURATION: "How long does it last" (e.g., seconds, minutes, hours)     Approximately 1-2 min 6. SEVERITY: "How bad is the pain?"  (e.g., Scale 1-10; mild, moderate, or severe)    - MILD (1-3): doesn't interfere with normal activities     - MODERATE (4-7): interferes with normal activities or awakens from sleep    - SEVERE (8-10): excruciating pain, unable to do any normal activities       Moderate 5 7. CARDIAC RISK FACTORS: "Do you have any history of heart problems or risk factors for heart disease?" (e.g., prior heart attack, angina; high blood pressure, diabetes, being overweight, high cholesterol, smoking, or strong family history of heart disease)     No personal history but does  have family history of heart disease 8. PULMONARY RISK FACTORS: "Do you have any history of lung disease?"  (e.g., blood clots in lung, asthma, emphysema, birth control pills)     Asthma 9. CAUSE: "What do you think is causing the chest pain?"     No 10. OTHER SYMPTOMS: "Do you have any other symptoms?" (e.g., dizziness, nausea, vomiting, sweating, fever, difficulty breathing, cough)       No 11. PREGNANCY: "Is there any chance you are pregnant?" "When was your last menstrual period?"       No  Protocols used: CHEST PAIN-A-AH

## 2017-12-01 NOTE — Telephone Encounter (Signed)
Noted patient went to ED  

## 2017-12-02 ENCOUNTER — Other Ambulatory Visit: Payer: Self-pay | Admitting: Endocrinology

## 2017-12-02 DIAGNOSIS — E065 Other chronic thyroiditis: Secondary | ICD-10-CM

## 2017-12-06 ENCOUNTER — Telehealth (INDEPENDENT_AMBULATORY_CARE_PROVIDER_SITE_OTHER): Payer: Self-pay | Admitting: Orthopedic Surgery

## 2017-12-06 ENCOUNTER — Other Ambulatory Visit (INDEPENDENT_AMBULATORY_CARE_PROVIDER_SITE_OTHER): Payer: Self-pay

## 2017-12-06 MED ORDER — METHYLPREDNISOLONE 4 MG PO TABS: ORAL_TABLET | ORAL | 0 refills | Status: DC

## 2017-12-06 MED ORDER — BACLOFEN 10 MG PO TABS: ORAL_TABLET | ORAL | 0 refills | Status: DC

## 2017-12-06 NOTE — Telephone Encounter (Signed)
This is for Medrol dosepak and Baclofen 10 mg.  Last given 04/01/07 for her neck.

## 2017-12-06 NOTE — Telephone Encounter (Signed)
Ok to rf pls call thx

## 2017-12-06 NOTE — Telephone Encounter (Signed)
Patient called asking for a refill on her muscle relaxer and also the pain reliever/steriod. CB # [phone]

## 2017-12-06 NOTE — Telephone Encounter (Signed)
Will you please send these to the pharmacy?

## 2017-12-06 NOTE — Telephone Encounter (Signed)
Rx requests 

## 2017-12-06 NOTE — Telephone Encounter (Signed)
Prescriptions sent to pharmacy

## 2017-12-06 NOTE — Addendum Note (Signed)
Addended by: Brand Males E on: 12/06/2017 05:30 PM   Modules accepted: Orders

## 2017-12-22 ENCOUNTER — Ambulatory Visit
Admission: RE | Admit: 2017-12-22 | Discharge: 2017-12-22 | Disposition: A | Discharge status: Home / Self Care | Payer: BLUE CROSS/BLUE SHIELD | Source: Ambulatory Visit | Attending: Endocrinology | Admitting: Endocrinology

## 2017-12-22 DIAGNOSIS — E065 Other chronic thyroiditis: Secondary | ICD-10-CM | POA: Diagnosis not present

## 2017-12-28 ENCOUNTER — Other Ambulatory Visit (INDEPENDENT_AMBULATORY_CARE_PROVIDER_SITE_OTHER): Payer: BLUE CROSS/BLUE SHIELD

## 2017-12-28 ENCOUNTER — Encounter: Payer: Self-pay | Admitting: Neurology

## 2017-12-28 ENCOUNTER — Ambulatory Visit (INDEPENDENT_AMBULATORY_CARE_PROVIDER_SITE_OTHER): Payer: BLUE CROSS/BLUE SHIELD | Admitting: Neurology

## 2017-12-28 VITALS — BP 122/78 | HR 64 | Ht 69.0 in | Wt 191.0 lb

## 2017-12-28 DIAGNOSIS — E559 Vitamin D deficiency, unspecified: Secondary | ICD-10-CM | POA: Diagnosis not present

## 2017-12-28 DIAGNOSIS — E038 Other specified hypothyroidism: Secondary | ICD-10-CM | POA: Diagnosis not present

## 2017-12-28 DIAGNOSIS — G40309 Generalized idiopathic epilepsy and epileptic syndromes, not intractable, without status epilepticus: Secondary | ICD-10-CM

## 2017-12-28 DIAGNOSIS — E063 Autoimmune thyroiditis: Secondary | ICD-10-CM

## 2017-12-28 LAB — VITAMIN D 25 HYDROXY (VIT D DEFICIENCY, FRACTURES): VITD: 28.93 ng/mL — AB (ref 30.00–100.00)

## 2017-12-28 LAB — TSH: TSH: 1.17 u[IU]/mL (ref 0.35–4.50)

## 2017-12-28 NOTE — Progress Notes (Signed)
NEUROLOGY FOLLOW UP OFFICE NOTE  LAILANI TOOL [phone]  HISTORY OF PRESENT ILLNESS: Rebecca Silva is a 47 year old right-handed woman with history of Hashimoto's thyroiditis, asthma and Wolff-Parkinson-White who follows up for primary generalized epilepsy with absence seizures.     UPDATE: She takes Keppra 750mg  in AM, 750mg  at noon and 1500mg  at bedtime.  She splits the morning dose of Keppra from 1500mg  twice daily to 750mg  in AM, 750mg  at noon because it makes her sleepy.  She is doing well.   HISTORY: She has had these episodes of eye flutter at least since adolescence.  Suddenly, both of her eyes will roll back for one or two seconds.  She is not aware that it is happening.  Initially, they occured several times a day, every few minutes.  There is a question about whether she loses awareness during an episode.  If you start a sentence when it occurs, she is not aware of what you said.  If she is speaking, she will stop and say "um" when it occurs.  She denies headache or focal abnormalities.  She denies any abnormal movements or gait abnormalities.  They occur spontaneously and not triggered or relieved by anything particular.  She says that her mom has it but less severe.  Her grandfather had epilepsy.    She had an ambulatory EEG, which showed brief intermittent bursts of generalized slowing with imbedded sharp waves, suggestive of primary generalized epilepsy.    She also had an MRI of the brain performed on 12/13/14, which was personally reviewed and showed no evidence of acute infarct, mass lesion, hemorrhage or mesial temporal sclerosis.  PAST MEDICAL HISTORY: Past Medical History:  Diagnosis Date  . Asthma   . Heart murmur   . History of UTI   . Hypothyroidism   . Seasonal allergies   . Seizure disorder ([place])   . Urine incontinence   . Wolff-Parkinson-White pattern    a. noted on EKG in 07/2016. Echo showed a preserved EF of 55-60% with no regional WMA. ETT showed  WPW waveforms with no evidence of HB.     MEDICATIONS: Current Outpatient Medications on File Prior to Visit  Medication Sig Dispense Refill  . ferrous sulfate 325 (65 FE) MG EC tablet Take 325 mg by mouth 3 (three) times daily with meals.    . Multiple Vitamin (MULTIVITAMIN) tablet Take 1 tablet by mouth daily.    Marland Kitchen albuterol (PROVENTIL) (2.5 MG/3ML) 0.083% nebulizer solution Take 3 mLs (2.5 mg total) by nebulization every 6 (six) hours as needed for wheezing or shortness of breath. 75 mL 0  . baclofen (LIORESAL) 10 MG tablet Take 1 po bid prn muscle spasms (Patient not taking: Reported on 12/28/2017) 30 each 0  . budesonide-formoterol (SYMBICORT) 80-4.5 MCG/ACT inhaler INHALE 2 PUFFS FIRST THING IN THE MORNING AND THEN ANOTHER 2 PUFFS ABOUT 12 HOURS LATER. 10.2 g 11  . Cholecalciferol (VITAMIN D) 2000 units tablet Take 1 tablet by mouth daily. Reported on 12/31/2015    . levETIRAcetam (KEPPRA) 750 MG tablet TAKE (2) TABLETS BY MOUTH TWICE DAILY. 120 tablet 3  . methylPREDNISolone (MEDROL) 4 MG tablet Dosepak, take as directed (Patient not taking: Reported on 12/28/2017) 21 tablet 0  . PROAIR HFA 108 (90 Base) MCG/ACT inhaler INHALE 2 PUFFS EVERY 4 HOURS AS NEEDED. ONLY IF YOU CAN'T CATCH YOUR BREATH. 8.5 g 0  . SYNTHROID 150 MCG tablet TAKE ONE TABLET BY MOUTH EVERY MORNING 60 tablet 0  No current facility-administered medications on file prior to visit.     ALLERGIES: No Known Allergies  FAMILY HISTORY: Family History  Problem Relation Age of Onset  . Heart disease Mother 20       open heart surgery  . Heart disease Maternal Grandmother   . Epilepsy Maternal Grandfather     SOCIAL HISTORY: Social History   Socioeconomic History  . Marital status: Married    Spouse name: Not on file  . Number of children: Not on file  . Years of education: Not on file  . Highest education level: Not on file  Occupational History  . Occupation: Radio broadcast assistant   Social Needs  . Financial resource  strain: Not on file  . Food insecurity:    Worry: Not on file    Inability: Not on file  . Transportation needs:    Medical: Not on file    Non-medical: Not on file  Tobacco Use  . Smoking status: Never Smoker  . Smokeless tobacco: Never Used  Substance and Sexual Activity  . Alcohol use: No    Alcohol/week: 0.0 oz  . Drug use: No  . Sexual activity: Yes    Partners: Male  Lifestyle  . Physical activity:    Days per week: Not on file    Minutes per session: Not on file  . Stress: Not on file  Relationships  . Social connections:    Talks on phone: Not on file    Gets together: Not on file    Attends religious service: Not on file    Active member of club or organization: Not on file    Attends meetings of clubs or organizations: Not on file    Relationship status: Not on file  . Intimate partner violence:    Fear of current or ex partner: Not on file    Emotionally abused: Not on file    Physically abused: Not on file    Forced sexual activity: Not on file  Other Topics Concern  . Not on file  Social History Narrative   Paralegal   Married: 23 yrs   Children: 19 yrs and 15 yrs   Regular exercise: walking/running 2 x a wk   Caffeine use: coffee daily, sweet tea     REVIEW OF SYSTEMS: Constitutional: No fevers, chills, or sweats, no generalized fatigue, change in appetite Eyes: No visual changes, double vision, eye pain Ear, nose and throat: No hearing loss, ear pain, nasal congestion, sore throat Cardiovascular: No chest pain, palpitations Respiratory:  No shortness of breath at rest or with exertion, wheezes GastrointestinaI: No nausea, vomiting, diarrhea, abdominal pain, fecal incontinence Genitourinary:  No dysuria, urinary retention or frequency Musculoskeletal:  No neck pain, back pain Integumentary: No rash, pruritus, skin lesions Neurological: as above Psychiatric: No depression, insomnia, anxiety Endocrine: No palpitations, fatigue, diaphoresis, mood  swings, change in appetite, change in weight, increased thirst Hematologic/Lymphatic:  No purpura, petechiae. Allergic/Immunologic: no itchy/runny eyes, nasal congestion, recent allergic reactions, rashes  PHYSICAL EXAM: Vitals:   12/28/17 1347  BP: 122/78  Pulse: 64  SpO2: 98%   General: No acute distress.  Patient appears well-groomed.  normal body habitus. Head:  Normocephalic/atraumatic Eyes:  Fundi examined but not visualized Neck: supple, no paraspinal tenderness, full range of motion Heart:  Regular rate and rhythm Lungs:  Clear to auscultation bilaterally Back: No paraspinal tenderness Neurological Exam: alert and oriented to person, place, and time. Attention span and concentration intact, recent and remote memory intact,  fund of knowledge intact.  Speech fluent and not dysarthric, language intact.  CN II-XII intact. Bulk and tone normal, muscle strength 5/5 throughout.  Sensation to light touch, temperature and vibration intact.  Deep tendon reflexes 2+ throughout, toes downgoing.  Finger to nose and heel to shin testing intact.  Gait normal, Romberg negative.  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  PLAN: 1.  Continue Keppra 1500mg  twice daily (or 750mg /750mg /1500mg ).  I suggested extended-release levetiracetam 3000mg  at bedtime may be a consideration in the future. 2.  Follow up in one year.  19 minutes spent face to face with patient, over 50% spent discussing management.  Metta Clines, DO  CC:  Pricilla Holm, MD

## 2017-12-28 NOTE — Patient Instructions (Signed)
1.  Continue levetiracetam (Keppra) 750mg  pills:  1 pill in morning, 1 pill afternoon and 2 pills at bedtime 2.  Follow up in one year

## 2017-12-29 LAB — PTH, INTACT AND CALCIUM
Calcium: 9.2 mg/dL (ref 8.6–10.2)
PTH: 92 pg/mL — ABNORMAL HIGH (ref 14–64)

## 2018-02-28 ENCOUNTER — Other Ambulatory Visit: Payer: Self-pay | Admitting: Neurology

## 2018-04-04 ENCOUNTER — Other Ambulatory Visit (INDEPENDENT_AMBULATORY_CARE_PROVIDER_SITE_OTHER): Payer: BLUE CROSS/BLUE SHIELD

## 2018-04-04 ENCOUNTER — Ambulatory Visit (INDEPENDENT_AMBULATORY_CARE_PROVIDER_SITE_OTHER): Payer: BLUE CROSS/BLUE SHIELD | Admitting: Internal Medicine

## 2018-04-04 ENCOUNTER — Encounter: Payer: Self-pay | Admitting: Internal Medicine

## 2018-04-04 VITALS — BP 120/82 | HR 58 | Temp 98.0°F | Ht 69.0 in | Wt 187.0 lb

## 2018-04-04 DIAGNOSIS — E063 Autoimmune thyroiditis: Secondary | ICD-10-CM

## 2018-04-04 DIAGNOSIS — E038 Other specified hypothyroidism: Secondary | ICD-10-CM

## 2018-04-04 DIAGNOSIS — R194 Change in bowel habit: Secondary | ICD-10-CM | POA: Diagnosis not present

## 2018-04-04 LAB — TSH: TSH: 3.43 u[IU]/mL (ref 0.35–4.50)

## 2018-04-04 LAB — T4, FREE: Free T4: 1.08 ng/dL (ref 0.60–1.60)

## 2018-04-04 NOTE — Patient Instructions (Signed)
We will check the labs today and call you back about the results.   Try a metamucil or benefiber over the counter to see if this helps.   If not you can try an over the counter medicine called ibguard which is peppermint oil based and can help with pain and digestive overall.   If you are still having diarrhea it is okay to try imodium. This can make you constipated so if you are do not take more imodium.

## 2018-04-04 NOTE — Progress Notes (Signed)
   Subjective:    Patient ID: Rebecca Silva, female    DOB: [birth year], 47 y.o.   MRN: [id number]  HPI The patient is a 47 YO female coming in for changes to bowel habits. Previously she was more constipation prone and had irregular bowel movements. She was getting bloating and discomfort with bread and thought maybe she was sensitive to gluten. She cut out bread and dairy products about 1-2 months ago. She then started having more bowel movements. She is having a loose bowel movement about 3-4 times per week. No more than once per day. Usually can make it to the bathroom but rarely needs to run to bathroom. Denies blood in stool. Other days she has a normal BM. She has not had colonoscopy as there is no family history of colon cancer and she is only 53. Does have thyroid disease and denies missing doses or change in thyroid dosing.   Review of Systems  Constitutional: Negative.   HENT: Negative.   Eyes: Negative.   Respiratory: Negative for cough, chest tightness and shortness of breath.   Cardiovascular: Negative for chest pain, palpitations and leg swelling.  Gastrointestinal: Positive for diarrhea. Negative for abdominal distention, abdominal pain, constipation, nausea and vomiting.  Musculoskeletal: Negative.   Skin: Negative.   Neurological: Negative.   Psychiatric/Behavioral: Negative.       Objective:   Physical Exam  Constitutional: She is oriented to person, place, and time. She appears well-developed and well-nourished.  HENT:  Head: Normocephalic and atraumatic.  Eyes: EOM are normal.  Neck: Normal range of motion.  Cardiovascular: Normal rate and regular rhythm.  Pulmonary/Chest: Effort normal and breath sounds normal. No respiratory distress. She has no wheezes. She has no rales.  Abdominal: Soft. Bowel sounds are normal. She exhibits no distension. There is no tenderness. There is no rebound.  Musculoskeletal: She exhibits no edema.  Neurological: She is alert and  oriented to person, place, and time. Coordination normal.  Skin: Skin is warm and dry.  Psychiatric: She has a normal mood and affect.   Vitals:   04/04/18 1324  BP: 120/82  Pulse: (!) 58  Temp: 98 F (36.7 C)  TempSrc: Oral  SpO2: 98%  Weight: 187 lb (84.8 kg)  Height: 5\' 9"  (1.753 m)      Assessment & Plan:

## 2018-04-05 DIAGNOSIS — R194 Change in bowel habit: Secondary | ICD-10-CM | POA: Insufficient documentation

## 2018-04-05 NOTE — Assessment & Plan Note (Signed)
Checking TSH and free T4 to ensure levels are not high and causing the loose stools.

## 2018-04-05 NOTE — Assessment & Plan Note (Signed)
Suspect due to dietary changes. Asked her to try adding fiber or ibguard to help. Checking thyroid levels to make sure she is not over-replaced. Adjust as needed. Due for screening colonoscopy in February and if no improvement will refer to GI for colonoscopy sooner.

## 2018-06-06 ENCOUNTER — Other Ambulatory Visit: Payer: Self-pay | Admitting: Endocrinology

## 2018-06-15 ENCOUNTER — Other Ambulatory Visit: Payer: Self-pay | Admitting: Internal Medicine

## 2018-06-15 DIAGNOSIS — J4540 Moderate persistent asthma, uncomplicated: Secondary | ICD-10-CM

## 2018-09-02 ENCOUNTER — Other Ambulatory Visit: Payer: Self-pay | Admitting: Endocrinology

## 2018-10-21 ENCOUNTER — Ambulatory Visit: Payer: BLUE CROSS/BLUE SHIELD | Admitting: Internal Medicine

## 2018-10-24 ENCOUNTER — Ambulatory Visit: Payer: BLUE CROSS/BLUE SHIELD | Admitting: Endocrinology

## 2018-11-01 ENCOUNTER — Other Ambulatory Visit: Payer: Self-pay

## 2018-11-01 ENCOUNTER — Encounter: Payer: BLUE CROSS/BLUE SHIELD | Admitting: Endocrinology

## 2018-11-01 NOTE — Progress Notes (Signed)
   Subjective:    Patient ID: Rebecca Silva, female    DOB: [birth year], 48 y.o.   MRN: [id number]  HPI  Pt returns for f/u of primary hypothyroidism (dx'ed 2003; she has been on thyroid hormone supplement since then; she has been on the current dosage of 150 mcg/day, since late 2015; this is the highest dosage she has ever taken; [place] in 2013 and 2017 showed heterogeneous tissue, but no nodule).  She takes synthroid 150/d, as rx'ed.   Hypovitaminosis-D: pt states she feels well in general, except she still has intermittent muscle cramps.  She takes 2000 units/d.     Review of Systems     Objective:   Physical Exam        Assessment & Plan:

## 2018-11-02 ENCOUNTER — Encounter: Payer: Self-pay | Admitting: Endocrinology

## 2018-11-03 ENCOUNTER — Other Ambulatory Visit: Payer: Self-pay

## 2018-11-03 ENCOUNTER — Ambulatory Visit (INDEPENDENT_AMBULATORY_CARE_PROVIDER_SITE_OTHER): Payer: BLUE CROSS/BLUE SHIELD | Admitting: Endocrinology

## 2018-11-03 DIAGNOSIS — E063 Autoimmune thyroiditis: Secondary | ICD-10-CM | POA: Diagnosis not present

## 2018-11-03 DIAGNOSIS — E038 Other specified hypothyroidism: Secondary | ICD-10-CM

## 2018-11-03 DIAGNOSIS — E559 Vitamin D deficiency, unspecified: Secondary | ICD-10-CM

## 2018-11-03 NOTE — Progress Notes (Addendum)
Subjective:    Patient ID: Rebecca Silva, female    DOB: 04-Mar-1971, 48 y.o.   MRN: [id number]  HPI  telehealth visit today via doxy video visit.  Alternatives to telehealth are presented to this patient, and the patient agrees to the telehealth visit. Pt is advised of the cost of the visit, and agrees to this, also.   Patient is at home, and I am at the office.   Pt returns for f/u of primary hypothyroidism (dx'ed 2003; she has been on thyroid hormone supplement since then; she has been on the current dosage of 150 mcg/day, since late 2015; this is the highest dosage she has ever taken; [place] in 2013 and 2017 showed heterogeneous tissue, but no nodule).  She takes synthroid 150/d, as rx'ed.  Hypovitaminosis-D: she has slight leg cramps, and assoc numbness of the hands  She takes 5000 units/d.  Past Medical History:  Diagnosis Date  . Asthma   . Heart murmur   . History of UTI   . Hypothyroidism   . Seasonal allergies   . Seizure disorder ([place])   . Urine incontinence   . Wolff-Parkinson-White pattern    a. noted on EKG in 07/2016. Echo showed a preserved EF of 55-60% with no regional WMA. ETT showed WPW waveforms with no evidence of HB.     No past surgical history on file.  Social History   Socioeconomic History  . Marital status: Married    Spouse name: Not on file  . Number of children: Not on file  . Years of education: Not on file  . Highest education level: Not on file  Occupational History  . Occupation: Radio broadcast assistant   Social Needs  . Financial resource strain: Not on file  . Food insecurity:    Worry: Not on file    Inability: Not on file  . Transportation needs:    Medical: Not on file    Non-medical: Not on file  Tobacco Use  . Smoking status: Never Smoker  . Smokeless tobacco: Never Used  Substance and Sexual Activity  . Alcohol use: No    Alcohol/week: 0.0 standard drinks  . Drug use: No  . Sexual activity: Yes    Partners: Male  Lifestyle  . Physical  activity:    Days per week: Not on file    Minutes per session: Not on file  . Stress: Not on file  Relationships  . Social connections:    Talks on phone: Not on file    Gets together: Not on file    Attends religious service: Not on file    Active member of club or organization: Not on file    Attends meetings of clubs or organizations: Not on file    Relationship status: Not on file  . Intimate partner violence:    Fear of current or ex partner: Not on file    Emotionally abused: Not on file    Physically abused: Not on file    Forced sexual activity: Not on file  Other Topics Concern  . 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 albuterol (PROVENTIL) (2.5 MG/3ML) 0.083% nebulizer solution Take 3 mLs (2.5 mg total) by  nebulization every 6 (six) hours as needed for wheezing or shortness of breath. 75 mL 0  . baclofen (LIORESAL) 10 MG tablet Take 1 po bid prn muscle spasms 30 each 0  . budesonide-formoterol (SYMBICORT) 80-4.5 MCG/ACT inhaler INHALE 2 PUFFS FIRST THING IN THE MORNING AND THEN ANOTHER 2 PUFFS ABOUT 12 HOURS LATER. 10.2 g 11  . Cholecalciferol (VITAMIN D) 2000 units tablet Take 1 tablet by mouth daily. Reported on 12/31/2015    . ferrous sulfate 325 (65 FE) MG EC tablet Take 325 mg by mouth daily.     Marland Kitchen levETIRAcetam (KEPPRA) 750 MG tablet 1 pill in morning, 1 pill afternoon and 2 pills at bedtime 360 tablet 3  . Multiple Vitamin (MULTIVITAMIN) tablet Take 1 tablet by mouth daily.    Marland Kitchen PROAIR HFA 108 (90 Base) MCG/ACT inhaler INHALE 2 PUFFS EVERY 4 HOURS AS NEEDED. ONLY IF YOU CAN'T CATCH YOUR BREATH. 8.5 g 5  . SYNTHROID 150 MCG tablet TAKE ONE TABLET BY MOUTH EVERY MORNING 60 tablet 0   No current facility-administered medications on  file prior to visit.     No Known Allergies  Family History  Problem Relation Age of Onset  . Heart disease Mother 37       open heart surgery  . Heart disease Maternal Grandmother   . Epilepsy Maternal Grandfather    Review of Systems She has slight fatigue.  Denies leg swelling.      Objective:   Physical Exam      Assessment & Plan:  Vit-D def: due for recheck Hypothyroidism: recheck today  Patient Instructions  Blood tests are requested for you today.  Please do in 1 month.  We'll let you know about the results.   I would be happy to see you back here as needed.

## 2018-11-03 NOTE — Patient Instructions (Signed)
Blood tests are requested for you today.  Please do in 1 month.  We'll let you know about the results.   I would be happy to see you back here as needed.

## 2018-11-17 ENCOUNTER — Other Ambulatory Visit: Payer: Self-pay | Admitting: Endocrinology

## 2018-11-28 ENCOUNTER — Encounter: Payer: Self-pay | Admitting: Internal Medicine

## 2018-11-28 ENCOUNTER — Ambulatory Visit (INDEPENDENT_AMBULATORY_CARE_PROVIDER_SITE_OTHER): Payer: BLUE CROSS/BLUE SHIELD | Admitting: Internal Medicine

## 2018-11-28 ENCOUNTER — Other Ambulatory Visit (INDEPENDENT_AMBULATORY_CARE_PROVIDER_SITE_OTHER): Payer: BLUE CROSS/BLUE SHIELD

## 2018-11-28 ENCOUNTER — Encounter: Payer: Self-pay | Admitting: Endocrinology

## 2018-11-28 ENCOUNTER — Other Ambulatory Visit: Payer: Self-pay

## 2018-11-28 VITALS — BP 136/88 | HR 62 | Temp 98.2°F | Ht 69.0 in | Wt 193.0 lb

## 2018-11-28 DIAGNOSIS — E559 Vitamin D deficiency, unspecified: Secondary | ICD-10-CM

## 2018-11-28 DIAGNOSIS — Z1211 Encounter for screening for malignant neoplasm of colon: Secondary | ICD-10-CM | POA: Diagnosis not present

## 2018-11-28 DIAGNOSIS — E063 Autoimmune thyroiditis: Secondary | ICD-10-CM | POA: Diagnosis not present

## 2018-11-28 DIAGNOSIS — Z0000 Encounter for general adult medical examination without abnormal findings: Secondary | ICD-10-CM

## 2018-11-28 DIAGNOSIS — G40309 Generalized idiopathic epilepsy and epileptic syndromes, not intractable, without status epilepticus: Secondary | ICD-10-CM

## 2018-11-28 DIAGNOSIS — E038 Other specified hypothyroidism: Secondary | ICD-10-CM

## 2018-11-28 DIAGNOSIS — J4540 Moderate persistent asthma, uncomplicated: Secondary | ICD-10-CM

## 2018-11-28 LAB — COMPREHENSIVE METABOLIC PANEL
ALT: 13 U/L (ref 0–35)
AST: 15 U/L (ref 0–37)
Albumin: 4.3 g/dL (ref 3.5–5.2)
Alkaline Phosphatase: 52 U/L (ref 39–117)
BUN: 14 mg/dL (ref 6–23)
CO2: 28 mEq/L (ref 19–32)
Calcium: 9.3 mg/dL (ref 8.4–10.5)
Chloride: 106 mEq/L (ref 96–112)
Creatinine, Ser: 0.83 mg/dL (ref 0.40–1.20)
GFR: 72.71 mL/min (ref >60.00)
Glucose, Bld: 95 mg/dL (ref 70–99)
Potassium: 4.3 mEq/L (ref 3.5–5.1)
Sodium: 142 mEq/L (ref 135–145)
Total Bilirubin: 0.4 mg/dL (ref 0.2–1.2)
Total Protein: 7 g/dL (ref 6.0–8.3)

## 2018-11-28 LAB — CBC
HCT: 41.2 % (ref 36.0–46.0)
Hemoglobin: 13.8 g/dL (ref 12.0–15.0)
MCHC: 33.6 g/dL (ref 30.0–36.0)
MCV: 85.4 fl (ref 78.0–100.0)
Platelets: 184 10*3/uL (ref 150.0–400.0)
RBC: 4.82 Mil/uL (ref 3.87–5.11)
RDW: 12.6 % (ref 11.5–15.5)
WBC: 6.5 10*3/uL (ref 4.0–10.5)

## 2018-11-28 LAB — LIPID PANEL
Cholesterol: 192 mg/dL (ref 0–200)
HDL: 39 mg/dL — ABNORMAL LOW (ref >39.00)
LDL Cholesterol: 135 mg/dL — ABNORMAL HIGH (ref 0–99)
NonHDL: 153.36
Total CHOL/HDL Ratio: 5
Triglycerides: 93 mg/dL (ref 0.0–149.0)
VLDL: 18.6 mg/dL (ref 0.0–40.0)

## 2018-11-28 LAB — TSH: TSH: 1.61 u[IU]/mL (ref 0.35–4.50)

## 2018-11-28 LAB — HEMOGLOBIN A1C: Hgb A1c MFr Bld: 5.7 % (ref 4.6–6.5)

## 2018-11-28 LAB — T4, FREE: Free T4: 1.32 ng/dL (ref 0.60–1.60)

## 2018-11-28 LAB — VITAMIN D 25 HYDROXY (VIT D DEFICIENCY, FRACTURES): VITD: 55.43 ng/mL (ref 30.00–100.00)

## 2018-11-28 NOTE — Assessment & Plan Note (Signed)
Checking vitamin D level and adjust as needed.  

## 2018-11-28 NOTE — Assessment & Plan Note (Signed)
Checking TSH and free T4 and forward to endo for any need for change synthroid 150 mcg daily.

## 2018-11-28 NOTE — Assessment & Plan Note (Signed)
Sees neurology and maintained on keppra BID.

## 2018-11-28 NOTE — Patient Instructions (Signed)

## 2018-11-28 NOTE — Progress Notes (Signed)
   Subjective:   Patient ID: Rebecca Silva, female    DOB: 07-Apr-1971, 48 y.o.   MRN: [id number]  HPI The patient is a 48 YO female coming in for physical.   PMH, [facility], social history reviewed and updated.   Review of Systems  Constitutional: Negative.   HENT: Negative.   Eyes: Negative.   Respiratory: Negative for cough, chest tightness and shortness of breath.   Cardiovascular: Negative for chest pain, palpitations and leg swelling.  Gastrointestinal: Negative for abdominal distention, abdominal pain, constipation, diarrhea, nausea and vomiting.  Musculoskeletal: Negative.   Skin: Negative.   Neurological: Positive for headaches.  Psychiatric/Behavioral: Negative.     Objective:  Physical Exam Constitutional:      Appearance: She is well-developed.  HENT:     Head: Normocephalic and atraumatic.  Neck:     Musculoskeletal: Normal range of motion.  Cardiovascular:     Rate and Rhythm: Normal rate and regular rhythm.  Pulmonary:     Effort: Pulmonary effort is normal. No respiratory distress.     Breath sounds: Normal breath sounds. No wheezing or rales.  Abdominal:     General: Bowel sounds are normal. There is no distension.     Palpations: Abdomen is soft.     Tenderness: There is no abdominal tenderness. There is no rebound.  Skin:    General: Skin is warm and dry.  Neurological:     Mental Status: She is alert and oriented to person, place, and time.     Coordination: Coordination normal.     Vitals:   11/28/18 0858  BP: 136/88  Pulse: 62  Temp: 98.2 F (36.8 C)  TempSrc: Oral  SpO2: 99%  Weight: 193 lb (87.5 kg)  Height: 5\' 9"  (1.753 m)    Assessment & Plan:

## 2018-11-28 NOTE — Assessment & Plan Note (Signed)
Flu shot counseled. Shingrix counesled. Tetanus up to date. Colonoscopy GI referral placed. Mammogram up to date, pap smear up to date. Counseled about sun safety and mole surveillance. Counseled about the dangers of distracted driving. Given 10 year screening recommendations.

## 2018-11-28 NOTE — Assessment & Plan Note (Signed)
Has been well controlled for some time with symbicort daily. Have advised her she can talk with pulmonary about reduction to symbicort prn for allergy season. Has albuterol prn as well.

## 2018-12-05 ENCOUNTER — Ambulatory Visit (INDEPENDENT_AMBULATORY_CARE_PROVIDER_SITE_OTHER): Payer: BLUE CROSS/BLUE SHIELD | Admitting: Internal Medicine

## 2018-12-05 ENCOUNTER — Encounter: Payer: Self-pay | Admitting: Internal Medicine

## 2018-12-05 ENCOUNTER — Other Ambulatory Visit: Payer: Self-pay

## 2018-12-05 VITALS — BP 118/80 | HR 60 | Temp 98.1°F | Ht 69.0 in | Wt 192.4 lb

## 2018-12-05 DIAGNOSIS — J4540 Moderate persistent asthma, uncomplicated: Secondary | ICD-10-CM | POA: Diagnosis not present

## 2018-12-05 MED ORDER — BUDESONIDE-FORMOTEROL FUMARATE 80-4.5 MCG/ACT IN AERO: INHALATION_SPRAY | RESPIRATORY_TRACT | 11 refills | Status: DC

## 2018-12-05 MED ORDER — ALBUTEROL SULFATE HFA 108 (90 BASE) MCG/ACT IN AERS: INHALATION_SPRAY | RESPIRATORY_TRACT | 1 refills | Status: DC

## 2018-12-05 NOTE — Patient Instructions (Signed)
Plan A = Automatic =  Symbicort 80 up to 2 puffs every 12 hours as needed   Plan B = Backup Only use your albuterol inhaler as a rescue medication to be used if you can't catch your breath by resting or doing a relaxed purse lip breathing pattern.  - The less you use it, the better it will work when you need it. - Ok to use the inhaler up to 2 puffs  every 4 hours if you must but call for appointment if use goes up over your usual need - Don't leave home without it !!  (think of it like the spare tire for your car)       Please schedule a follow up visit in 12 months but call sooner if needed

## 2018-12-05 NOTE — Progress Notes (Signed)
Subjective:    Patient ID: Rebecca Silva, female    DOB: 1970/11/12   MRN: [id number]    Brief patient profile:  30 yowf never smoker with tendency to bronchitis all her life sev times a year never required prednisone or breathing treatments until mid 56s but started then on prn saba  But never needed maint inhaler or more than twice weekly saba but since Dec 2014 needed it more often as much as twice daily then admitted:   Admit date: 06/06/2014 Discharge date: 06/08/2014   Discharge Diagnoses:  Principal Problem:  Asthma exacerbation   Hypothyroidism  Leukocytosis  Viral URI with cough  Tachycardia    History of Present Illness  06/20/2014 1st [facility] office visit/ Rebecca Silva  / referred by [facility] after above admit  Chief Complaint  Patient presents with  . Pulmonary Consult    Self referral. Pt states dxed with Asthma approx 10 yrs ago. She c/o SOB and cough since mid Nov 2015.  Her cough is prod with minimal yellow sputum.  She is using rescue inhaler on average 2 x per day.     much better since admit but note over use of saba at baseline and still doe x exertion but not at rest/ sleeping or adls rec Plan A = automatic = qvar 80 Take 2 puffs first thing in am and then another 2 puffs about 12 hours later (other option is dulera but probably don't need it)  Plan B = backup saba hfa Plan C= Crisis = nebulizer if plan A and B don't work  Plan D = Doctor, call me if not satisfied with the above       11/01/2014 f/u ov/Rebecca Silva re: moderate persistent asthma  qvar 80 2 bid / more sob than cough  Chief Complaint  Patient presents with  . Follow-up    PFT done today. Pt states that her breathing is unchanged since her last visit. She uses rescue inhaler 1-2 x per wk. She c/o Qvar sometimes making her feel that she is about to have an asthma attack.   marked decrease in need for saba since starting qvar / no noct flares  rec Change qvar to symbicort 160 Take 2 puffs  first thing in am and then another 2 puffs about 12 hours later.  Only use your albuterol as a rescue medication     02/08/2015 f/u ov/Rebecca Silva re: chronic asthma  Chief Complaint  Patient presents with  . Follow-up    Pt states breathing is doing well. She uses albuterol about once per month.   Not limited by breathing from desired activities   If the drugstore doesn't honor your zero coupon please let me know  Continue symbicort 160 Take 2 puffs first thing in am and then another 2 puffs about 12 hours later.  Only use your albuterol as a rescue medication     06/10/2016  f/u ov/Rebecca Silva re: chronic asthma  As long as takes symb 160 regularly has done great x > 1 year with min saba use despite suboptimal hfa (see a/p)  rec Try symbicort 80 Take 2 puffs first thing in am and then another 2 puffs about 12 hours later.  Work on inhaler technique:  Only use your albuterol as a rescue medication    10/20/2017  f/u ov/Rebecca Silva re: chronic asthma  Chief Complaint  Patient presents with  . Follow-up    Breathing is doing well. She rarely uses her albuterol inhaler and neb.  Dyspnea:  Running up to 30 min ok  Cough: no Sleep: fine SABA use:  Not even once  A week    rec  Continue symbicort 80 x 2 puffs first thing in am and then again if needed 12 hours Work on inhaler technique:   Only use your albuterol as a rescue medication   12/05/2018  f/u ov/Rebecca Silva re: chronic asthma/ weaned herself down to symb 80 1 bid  Chief Complaint  Patient presents with  . Follow-up    Breathing is doing well. She rarely uses her rescue inhaler or neb.   Dyspnea:  Still doing some running  In all conditioning Cough: no Sleeping: able to lie flat wakes up with bitemple ha  SABA use: rarely 02: none     No obvious day to day or daytime variability or assoc excess/ purulent sputum or mucus plugs or hemoptysis or cp or chest tightness, subjective wheeze or overt sinus or hb symptoms.   Sleeping  without  nocturnal  or early am exacerbation  of respiratory  c/o's or need for noct saba. Also denies any obvious fluctuation of symptoms with weather or environmental changes or other aggravating or alleviating factors except as outlined above   No unusual exposure hx or h/o childhood pna or def dx of asthma or knowledge of premature birth.  Current Allergies, Complete Past Medical History, Past Surgical History, Family History, and Social History were reviewed in [employer] record.  ROS  The following are not active complaints unless bolded Hoarseness, sore throat, dysphagia, dental problems, itching, sneezing,  nasal congestion or discharge of excess mucus or purulent secretions, ear ache,   fever, chills, sweats, unintended wt loss or wt gain, classically pleuritic or exertional cp,  orthopnea pnd or arm/hand swelling  or leg swelling, presyncope, palpitations, abdominal pain, anorexia, nausea, vomiting, diarrhea  or change in bowel habits or change in bladder habits, change in stools or change in urine, dysuria, hematuria,  rash, arthralgias, visual complaints, headache, numbness, weakness or ataxia or problems with walking or coordination,  change in mood or  memory.        Current Meds  Medication Sig  . albuterol (PROVENTIL) (2.5 MG/3ML) 0.083% nebulizer solution Take 3 mLs (2.5 mg total) by nebulization every 6 (six) hours as needed for wheezing or shortness of breath.  . baclofen (LIORESAL) 10 MG tablet Take 1 po bid prn muscle spasms  . budesonide-formoterol (SYMBICORT) 80-4.5 MCG/ACT inhaler INHALE 2 PUFFS FIRST THING IN THE MORNING AND THEN ANOTHER 2 PUFFS ABOUT 12 HOURS LATER.  . cholecalciferol (VITAMIN D3) 25 MCG (1000 UT) tablet Take 5,000 Units by mouth daily.  . ferrous sulfate 325 (65 FE) MG EC tablet Take 325 mg by mouth daily.   Marland Kitchen levETIRAcetam (KEPPRA) 750 MG tablet 1 pill in morning, 1 pill afternoon and 2 pills at bedtime  . Multiple Vitamin (MULTIVITAMIN)  tablet Take 1 tablet by mouth daily.  Marland Kitchen PROAIR HFA 108 (90 Base) MCG/ACT inhaler INHALE 2 PUFFS EVERY 4 HOURS AS NEEDED. ONLY IF YOU CAN'T CATCH YOUR BREATH.  . SYNTHROID 150 MCG tablet TAKE ONE TABLET BY MOUTH EVERY MORNING              Objective:   Physical Exam   amb somber  wf nad   Vital signs reviewed - Note on arrival 02 sats  96% on RA    07/24/2014          200  > 11/01/2014  199 >  02/08/2015 198 > 06/10/2016   194 >  10/20/2017   188 > 12/05/2018  192     06/20/14 199 lb (90.266 kg)  06/06/14 196 lb (88.905 kg)  06/06/14 196 lb (88.905 kg)     HEENT: nl dentition, turbinates bilaterally, and oropharynx. Nl external ear canals without cough reflex   NECK :  without JVD/Nodes/TM/ nl carotid upstrokes bilaterally   LUNGS: no acc muscle use,  Nl contour chest which is clear to A and P bilaterally without cough on insp or exp maneuvers   CV:  RRR  no s3 or murmur or increase in P2, and no edema   ABD:  soft and nontender with nl inspiratory excursion in the supine position. No bruits or organomegaly appreciated, bowel sounds nl  MS:  Nl gait/ ext warm without deformities, calf tenderness, cyanosis or clubbing No obvious joint restrictions   SKIN: warm and dry without lesions    NEURO:  alert, approp, nl sensorium with  no motor or cerebellar deficits apparent.           Assessment & Plan:

## 2018-12-05 NOTE — Assessment & Plan Note (Addendum)
Onset Dec 2014 - 07/24/2014   > trial of qvar 80 2bid - PFTs 11/01/2014 FEV1  2.36 (69%) ratio 74 p 33% improvement from saba/ dlco 80% -  11/01/2014 p extensive coaching HFA effectiveness =    75% > try symbicort 160 2bid > marked clinical  improvement 02/08/15  -  06/10/2016    try symb 80 2bid > all symptoms resolved  - 12/05/2018  After extensive coaching inhaler device,  effectiveness =    75%   Despite suboptimal hfa  All goals of chronic asthma control met including optimal function and elimination of symptoms with minimal need for rescue therapy.  Contingencies discussed in full including contacting this office immediately if not controlling the symptoms using the rule of two's.     Ok to titrate symbicort 80 Based on two studies from NEJM  378; 20 p 1865 (2018) and 380 : p2020-30 (2019) in pts with mild asthma it is reasonable to use low dose symbicort eg 80 2bid "prn" flare in this setting but I emphasized this was only shown with symbicort and takes advantage of the rapid onset of action but is not the same as "rescue therapy" but can be stopped once the acute symptoms have resolved and the need for rescue has been minimized (< 2 x weekly)    F/u can be q 12 h   I had an extended discussion with the patient reviewing all relevant studies completed to date and  lasting 15 to 20 minutes of a 25 minute visit    See device teaching which extended face to face time for this visit.  Each maintenance medication was reviewed in detail including emphasizing most importantly the difference between maintenance and prns and under what circumstances the prns are to be triggered using an action plan format that is not reflected in the computer generated alphabetically organized AVS which I have not found useful in most complex patients, especially with respiratory illnesses  Please see AVS for specific instructions unique to this visit that I personally wrote and verbalized to the the pt in detail and  then reviewed with pt  by my nurse highlighting any  changes in therapy recommended at today's visit to their plan of care.      

## 2018-12-27 ENCOUNTER — Encounter: Payer: Self-pay | Admitting: Neurology

## 2018-12-27 DIAGNOSIS — R32 Unspecified urinary incontinence: Secondary | ICD-10-CM | POA: Insufficient documentation

## 2018-12-27 DIAGNOSIS — R319 Hematuria, unspecified: Secondary | ICD-10-CM | POA: Insufficient documentation

## 2018-12-28 NOTE — Progress Notes (Signed)
Virtual Visit via Telephone Note The purpose of this virtual visit is to provide medical care while limiting exposure to the novel coronavirus.    Consent was obtained for phone visit:  Yes Answered questions that patient had about telehealth interaction:  Yes I discussed the limitations, risks, security and privacy concerns of performing an evaluation and management service by telephone. I also discussed with the patient that there may be a patient responsible charge related to this service. The patient expressed understanding and agreed to proceed.  Pt location: Home Physician Location: office Name of referring provider:  Hoyt Koch, * I connected with .Rebecca Silva at patients initiation/request on 12/29/2018 at 11:30 AM EDT by telephone and verified that I am speaking with the correct person using two identifiers.  Pt MRN:  [id number] Pt DOB:  [birth year]   History of Present Illness:  Rebecca Silva is a 48 year old right-handed woman with history of Hashimoto's thyroiditis, asthma and Wolff-Parkinson-White who follows up for primary generalized epilepsy with absence seizures.   UPDATE: She takes Keppra 750mg  in AM, 750mg  at noon and 1500mg  at bedtime.  She splits the morning dose of Keppra from 1500mg  twice daily to 750mg  in AM, 750mg  at noon because it makes her sleepy.  She is doing well.  No recurrent seizures.  She reports that for the past couple of years, her eft hand is sometimes shaky when she holds something in her left hand such as a mug or making a fist.  It does not occur at rest.   Does not occur in right hand.  She denies difficulty moving her arms, hands or feet.  No gait abnormality.  No difficulty swallowing.  Sense of smell is okay.  She denies family history of tremors.  HISTORY: She has had these episodes of eye flutter at least since adolescence. Suddenly, both of her eyes will roll back for one or two seconds. She is not aware that it is  happening. Initially, they occured several times a day, every few minutes. There is a question about whether she loses awareness during an episode. If you start a sentence when it occurs, she is not aware of what you said. If she is speaking, she will stop and say um when it occurs. She denies headache or focal abnormalities. She denies any abnormal movements or gait abnormalities. They occur spontaneously and not triggered or relieved by anything particular.  She says that her mom has it but less severe. Her grandfather had epilepsy.  She had an ambulatory EEG, which showed brief intermittent bursts of generalized slowing with imbedded sharp waves, suggestive of primary generalized epilepsy.  She also had an MRI of the brain performed on 12/13/14, which was personally reviewed and showed no evidence of acute infarct, mass lesion, hemorrhage or mesial temporal sclerosis.    Observations/Objective:   Vitals:  no acute distress.  Alert and oriented.  Speech fluent and not dysarthric.  Language intact.  Face symmetric.   Assessment and Plan:   1.  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 2.  Probable benign essential tremor.  1.  Refilled Keppra 3000mg  daily (750mg /750mg /1500mg )  2.  Monitor tremor 3.  Follow up in one year or sooner if needed.   Follow Up Instructions:    -I discussed the assessment and treatment plan with the patient. The patient was provided an opportunity to ask questions and all were answered. The patient agreed with the plan and demonstrated  an understanding of the instructions.   The patient was advised to call back or seek an in-person evaluation if the symptoms worsen or if the condition fails to improve as anticipated.    Total Time spent in visit with the patient was:  15 minutes   Dudley Major, DO

## 2018-12-29 ENCOUNTER — Telehealth (INDEPENDENT_AMBULATORY_CARE_PROVIDER_SITE_OTHER): Payer: BC Managed Care – PPO | Admitting: Neurology

## 2018-12-29 ENCOUNTER — Other Ambulatory Visit: Payer: Self-pay

## 2018-12-29 ENCOUNTER — Encounter: Payer: Self-pay | Admitting: Neurology

## 2018-12-29 VITALS — Ht 69.0 in | Wt 188.0 lb

## 2018-12-29 DIAGNOSIS — G250 Essential tremor: Secondary | ICD-10-CM

## 2018-12-29 DIAGNOSIS — G40309 Generalized idiopathic epilepsy and epileptic syndromes, not intractable, without status epilepticus: Secondary | ICD-10-CM | POA: Diagnosis not present

## 2018-12-29 MED ORDER — LEVETIRACETAM 750 MG PO TABS: ORAL_TABLET | ORAL | 3 refills | Status: DC

## 2019-01-02 ENCOUNTER — Encounter: Payer: Self-pay | Admitting: Gastroenterology

## 2019-01-18 ENCOUNTER — Telehealth: Payer: Self-pay

## 2019-01-18 NOTE — Telephone Encounter (Signed)
Patient was scheduled for PV today at 1:30 Pm. No answer at any of the numbers given. Several messages left to call and reschedule PV before 5:Pm today. If patient does not call her procedure will be cancelled per [place] guidelines. A no show letter will also be mailed.  Riki Sheer, LPN ( PV )

## 2019-01-25 ENCOUNTER — Ambulatory Visit: Payer: BLUE CROSS/BLUE SHIELD | Admitting: Internal Medicine

## 2019-02-01 ENCOUNTER — Encounter: Payer: BC Managed Care – PPO | Admitting: Gastroenterology

## 2019-04-06 ENCOUNTER — Encounter: Payer: Self-pay | Admitting: Internal Medicine

## 2019-04-06 ENCOUNTER — Other Ambulatory Visit (INDEPENDENT_AMBULATORY_CARE_PROVIDER_SITE_OTHER): Payer: BC Managed Care – PPO

## 2019-04-06 ENCOUNTER — Other Ambulatory Visit: Payer: Self-pay

## 2019-04-06 ENCOUNTER — Ambulatory Visit (INDEPENDENT_AMBULATORY_CARE_PROVIDER_SITE_OTHER): Payer: BC Managed Care – PPO | Admitting: Internal Medicine

## 2019-04-06 VITALS — BP 130/90 | HR 62 | Temp 97.8°F | Ht 69.0 in | Wt 195.0 lb

## 2019-04-06 DIAGNOSIS — M7989 Other specified soft tissue disorders: Secondary | ICD-10-CM | POA: Diagnosis not present

## 2019-04-06 LAB — COMPREHENSIVE METABOLIC PANEL
ALT: 11 U/L (ref 0–35)
AST: 11 U/L (ref 0–37)
Albumin: 4.2 g/dL (ref 3.5–5.2)
Alkaline Phosphatase: 56 U/L (ref 39–117)
BUN: 13 mg/dL (ref 6–23)
CO2: 29 mEq/L (ref 19–32)
Calcium: 9.2 mg/dL (ref 8.4–10.5)
Chloride: 107 mEq/L (ref 96–112)
Creatinine, Ser: 0.84 mg/dL (ref 0.40–1.20)
GFR: 71.61 mL/min (ref >60.00)
Glucose, Bld: 90 mg/dL (ref 70–99)
Potassium: 4.6 mEq/L (ref 3.5–5.1)
Sodium: 141 mEq/L (ref 135–145)
Total Bilirubin: 0.3 mg/dL (ref 0.2–1.2)
Total Protein: 6.8 g/dL (ref 6.0–8.3)

## 2019-04-06 LAB — BRAIN NATRIURETIC PEPTIDE: Pro B Natriuretic peptide (BNP): 40 pg/mL (ref 0.0–100.0)

## 2019-04-06 NOTE — Assessment & Plan Note (Signed)
Checking CMP and BNP. Recent thyroid labs are normal and no changes to dosage so recheck not done today. Advised to monitor salt, increase water intake and elevate legs if needed.

## 2019-04-06 NOTE — Patient Instructions (Signed)
We will check the labs today and let us know if this comes back.

## 2019-04-06 NOTE — Progress Notes (Signed)
   Subjective:   Patient ID: Rebecca Silva, female    DOB: 09-11-1970, 48 y.o.   MRN: [id number]  HPI The patient is a 47 YO female coming in for ankle and foot swelling. Started about 2 days or so ago. Overall has gradually improved since that time. Denies change in diet or exercise. Did run the day before swelling started but this is not unusual for her. Denies recent injury or overuse. Having no fevers or chills. Denies SOB with lying down or running. Has tried elevating legs which did help resolve the fluid. Today they are almost normal.  Review of Systems  Constitutional: Negative.   HENT: Negative.   Eyes: Negative.   Respiratory: Negative for cough, chest tightness and shortness of breath.   Cardiovascular: Positive for leg swelling. Negative for chest pain and palpitations.  Gastrointestinal: Negative for abdominal distention, abdominal pain, constipation, diarrhea, nausea and vomiting.  Musculoskeletal: Negative.   Skin: Negative.   Neurological: Negative.   Psychiatric/Behavioral: Negative.     Objective:  Physical Exam Constitutional:      Appearance: She is well-developed.  HENT:     Head: Normocephalic and atraumatic.  Neck:     Musculoskeletal: Normal range of motion.  Cardiovascular:     Rate and Rhythm: Normal rate and regular rhythm.  Pulmonary:     Effort: Pulmonary effort is normal. No respiratory distress.     Breath sounds: Normal breath sounds. No wheezing or rales.  Abdominal:     General: Bowel sounds are normal. There is no distension.     Palpations: Abdomen is soft.     Tenderness: There is no abdominal tenderness. There is no rebound.  Skin:    General: Skin is warm and dry.  Neurological:     Mental Status: She is alert and oriented to person, place, and time.     Coordination: Coordination normal.     Vitals:   04/06/19 0838  BP: 130/90  Pulse: 62  Temp: 97.8 F (36.6 C)  TempSrc: Oral  SpO2: 97%  Weight: 195 lb (88.5 kg)  Height:  5\' 9"  (1.753 m)    Assessment & Plan:

## 2019-04-18 ENCOUNTER — Encounter: Payer: Self-pay | Admitting: Internal Medicine

## 2019-04-21 ENCOUNTER — Encounter: Payer: Self-pay | Admitting: Internal Medicine

## 2019-04-21 ENCOUNTER — Ambulatory Visit (INDEPENDENT_AMBULATORY_CARE_PROVIDER_SITE_OTHER): Payer: BC Managed Care – PPO | Admitting: Internal Medicine

## 2019-04-21 ENCOUNTER — Other Ambulatory Visit: Payer: Self-pay

## 2019-04-21 VITALS — BP 110/80 | HR 71 | Temp 98.5°F | Ht 69.0 in | Wt 193.0 lb

## 2019-04-21 DIAGNOSIS — M7989 Other specified soft tissue disorders: Secondary | ICD-10-CM | POA: Diagnosis not present

## 2019-04-21 NOTE — Progress Notes (Signed)
   Subjective:   Patient ID: Rebecca Silva, female    DOB: Jul 16, 1971, 48 y.o.   MRN: [id number]  HPI The patient is a 48 YO female coming in for follow up of left leg problems. Previously she was having some swelling in the feet and ankles. This has since resolved and is minimal if at all. She is now having a knot which can get sore on the left calf region. She feels that this throbs sometimes and can get larger. She is worried about the veins. No redness on the skin and no trauma to the area.   Review of Systems  Constitutional: Negative.   HENT: Negative.   Eyes: Negative.   Respiratory: Negative for cough, chest tightness and shortness of breath.   Cardiovascular: Negative for chest pain, palpitations and leg swelling.  Gastrointestinal: Negative for abdominal distention, abdominal pain, constipation, diarrhea, nausea and vomiting.  Musculoskeletal: Positive for myalgias.  Skin: Negative.   Neurological: Negative.   Psychiatric/Behavioral: Negative.     Objective:  Physical Exam Constitutional:      Appearance: She is well-developed.  HENT:     Head: Normocephalic and atraumatic.  Neck:     Musculoskeletal: Normal range of motion.  Cardiovascular:     Rate and Rhythm: Normal rate and regular rhythm.  Pulmonary:     Effort: Pulmonary effort is normal. No respiratory distress.     Breath sounds: Normal breath sounds. No wheezing or rales.  Abdominal:     General: Bowel sounds are normal. There is no distension.     Palpations: Abdomen is soft.     Tenderness: There is no abdominal tenderness. There is no rebound.  Musculoskeletal:     Comments: Small vein deep with prominence left lateral leg without varicosity  Skin:    General: Skin is warm and dry.     Comments: Some spider veins on the left and right leg  Neurological:     Mental Status: She is alert and oriented to person, place, and time.     Coordination: Coordination normal.     Vitals:   04/21/19 1359   BP: 110/80  Pulse: 71  Temp: 98.5 F (36.9 C)  TempSrc: Oral  SpO2: 98%  Weight: 193 lb (87.5 kg)  Height: 5\' 9"  (1.753 m)    Assessment & Plan:

## 2019-04-21 NOTE — Assessment & Plan Note (Signed)
Checking vein reflux study as this prominence is likely a deep vein and not worrisome. Discussed compression stockings as potential treatment.

## 2019-04-21 NOTE — Patient Instructions (Signed)
We will get the ultrasound done of the legs.

## 2019-04-27 ENCOUNTER — Other Ambulatory Visit: Payer: Self-pay

## 2019-04-27 ENCOUNTER — Ambulatory Visit (HOSPITAL_COMMUNITY)
Admission: RE | Admit: 2019-04-27 | Discharge: 2019-04-27 | Disposition: A | Discharge status: Home / Self Care | Payer: BC Managed Care – PPO | Source: Ambulatory Visit | Attending: Cardiology | Admitting: Cardiology

## 2019-04-27 DIAGNOSIS — M7989 Other specified soft tissue disorders: Secondary | ICD-10-CM | POA: Diagnosis not present

## 2019-05-03 ENCOUNTER — Encounter: Payer: Self-pay | Admitting: Internal Medicine

## 2019-05-03 ENCOUNTER — Telehealth: Payer: Self-pay | Admitting: *Deleted

## 2019-05-03 DIAGNOSIS — M7989 Other specified soft tissue disorders: Secondary | ICD-10-CM

## 2019-05-03 NOTE — Telephone Encounter (Signed)
This encounter was created in error - please disregard.

## 2019-05-03 NOTE — Telephone Encounter (Signed)
Pt informed of below. Patient wants to know what her next step is. ? She c/o bilateral leg pain that has moved up between her knees and hip. She describes it as "deep soreness"  Notes recorded by Hoyt Koch, MD on 04/27/2019 at 4:24 PM EDT  Please call and let her know that they did not find any problems with the blood vessels in the leg but this is likely a vein just not large.

## 2019-05-03 NOTE — Telephone Encounter (Signed)
Copied from [place] [id number]. Topic: General - Inquiry >> May 03, 2019 12:44 PM Richardo Priest, NT wrote: Reason for CRM: Patient called in and triage could not go over results as there is nothing for her to result to patient in imaging or labs. Please advise.

## 2019-05-04 NOTE — Telephone Encounter (Signed)
Done

## 2019-05-04 NOTE — Telephone Encounter (Signed)
We can have her see a vascular specialist if she wants. Otherwise I would recommend to try some stretching exercises and take tylenol for pain if needed.

## 2019-05-04 NOTE — Telephone Encounter (Signed)
Pt informed of below. She would like referral to vascular specialist.

## 2019-05-31 DIAGNOSIS — Z130 Encounter for screening for diseases of the blood and blood-forming organs and certain disorders involving the immune mechanism: Secondary | ICD-10-CM | POA: Diagnosis not present

## 2019-05-31 DIAGNOSIS — Z01419 Encounter for gynecological examination (general) (routine) without abnormal findings: Secondary | ICD-10-CM | POA: Diagnosis not present

## 2019-05-31 DIAGNOSIS — Z124 Encounter for screening for malignant neoplasm of cervix: Secondary | ICD-10-CM | POA: Diagnosis not present

## 2019-05-31 DIAGNOSIS — Z1231 Encounter for screening mammogram for malignant neoplasm of breast: Secondary | ICD-10-CM | POA: Diagnosis not present

## 2019-05-31 DIAGNOSIS — Z6829 Body mass index (BMI) 29.0-29.9, adult: Secondary | ICD-10-CM | POA: Diagnosis not present

## 2019-06-23 ENCOUNTER — Encounter: Payer: Self-pay | Admitting: Physician Assistant

## 2019-06-23 ENCOUNTER — Other Ambulatory Visit: Payer: Self-pay

## 2019-06-23 ENCOUNTER — Ambulatory Visit (INDEPENDENT_AMBULATORY_CARE_PROVIDER_SITE_OTHER): Payer: BC Managed Care – PPO | Admitting: Physician Assistant

## 2019-06-23 VITALS — BP 125/79 | HR 65 | Temp 98.1°F | Resp 16 | Ht 69.0 in | Wt 190.0 lb

## 2019-06-23 DIAGNOSIS — M79662 Pain in left lower leg: Secondary | ICD-10-CM | POA: Diagnosis not present

## 2019-06-23 DIAGNOSIS — M7989 Other specified soft tissue disorders: Secondary | ICD-10-CM | POA: Diagnosis not present

## 2019-06-23 NOTE — Progress Notes (Signed)
Requested by:  Hoyt Koch, MD [place],  [place] [id number]  Reason for consultation: swelling and aching legs; pain in left calf    History of Present Illness   Rebecca Silva is a 48 y.o. ([birth year]) female who presents with chief complaint: swelling and aching legs; pain in left calf.  She states over the past month the edema of her legs has improved.  She also notes that the aching discomfort of her legs has improved.  She cannot attribute symptom resolution to anything in particular.  She has a reflux venous duplex performed in October of this year which was negative for DVT, superficial venous insufficiency or deep venous insufficiency.  She has tried compression in the past but only for exercise.  She tries to elevate her legs when possible and tries to avoid periods of prolonged sitting or standing.  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  She denies any history of DVT, venous ulcers, clotting disorders, smoking, exogenous estrogen use.  She also denies any claudication or nonhealing wounds.  Past Medical History:  Diagnosis Date  . Asthma   . Heart murmur   . History of UTI   . Hypothyroidism   . Seasonal allergies   . Seizure disorder ([place])   . Urine incontinence   . Wolff-Parkinson-White pattern    a. noted on EKG in 07/2016. Echo showed a preserved EF of 55-60% with no regional WMA. ETT showed WPW waveforms with no evidence of HB.     No past surgical history on file.  Social History   Socioeconomic History  . Marital status: Married    Spouse name: Not on file  . Number of children: Not on file  . Years of education: Not on file  . Highest education level: Not on file  Occupational History  . Occupation: Radio broadcast assistant   Social Needs  . Financial resource strain: Not on file  . Food insecurity    Worry: Not on file    Inability: Not on file  . Transportation needs   Medical: Not on file    Non-medical: Not on file  Tobacco Use  . Smoking status: Never Smoker  . Smokeless tobacco: Never Used  Substance and Sexual Activity  . Alcohol use: No    Alcohol/week: 0.0 standard drinks  . Drug use: No  . Sexual activity: Yes    Partners: Male  Lifestyle  . Physical activity    Days per week: Not on file    Minutes per session: Not on file  . Stress: Not on file  Relationships  . Social Herbalist on phone: Not on file    Gets together: Not on file    Attends religious service: Not on file    Active member of club or organization: Not on file    Attends meetings of clubs or organizations: Not on file    Relationship status: Not on file  . Intimate partner violence    Fear of current or ex partner: Not on file    Emotionally abused: Not on file    Physically abused: Not on file    Forced sexual activity: Not on file  Other Topics Concern  . 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 Heart disease Mother 90       open heart surgery  . Heart disease Maternal Grandmother   . Epilepsy Maternal Grandfather     Current Outpatient Medications  Medication Sig Dispense Refill  . albuterol (PROAIR HFA) 108 (90 Base) MCG/ACT inhaler UP  2 puffs every 4 hours as needed 8.5 g 1  . budesonide-formoterol (SYMBICORT) 80-4.5 MCG/ACT inhaler Take 2 puffs first thing in am and then another 2 puffs about 12 hours later. 10.2 g 11  . cholecalciferol (VITAMIN D3) 25 MCG (1000 UT) tablet Take 5,000 Units by mouth daily.    . ferrous sulfate 325 (65 FE) MG EC tablet Take 325 mg by mouth daily.     Marland Kitchen levETIRAcetam (KEPPRA) 750 MG tablet 1 pill in morning, 1 pill afternoon and 2 pills at bedtime 360 tablet 3  . Multiple Vitamin (MULTIVITAMIN) tablet Take 1 tablet  by mouth daily.    Marland Kitchen SYNTHROID 150 MCG tablet TAKE ONE TABLET BY MOUTH EVERY MORNING 60 tablet 4   No current facility-administered medications for this visit.     No Known Allergies  REVIEW OF SYSTEMS (negative unless checked):   Cardiac:  []  Chest pain or chest pressure? []  Shortness of breath upon activity? []  Shortness of breath when lying flat? []  Irregular heart rhythm?  Vascular:  [x]  Pain in calf, thigh, or hip brought on by walking? []  Pain in feet at night that wakes you up from your sleep? []  Blood clot in your veins? [x]  Leg swelling?  Pulmonary:  []  Oxygen at home? []  Productive cough? []  Wheezing?  Neurologic:  []  Sudden weakness in arms or legs? []  Sudden numbness in arms or legs? []  Sudden onset of difficult speaking or slurred speech? []  Temporary loss of vision in one eye? []  Problems with dizziness?  Gastrointestinal:  []  Blood in stool? []  Vomited blood?  Genitourinary:  []  Burning when urinating? []  Blood in urine?  Psychiatric:  []  Major depression  Hematologic:  []  Bleeding problems? []  Problems with blood clotting?  Dermatologic:  []  Rashes or ulcers?  Constitutional:  []  Fever or chills?  Ear/Nose/Throat:  []  Change in hearing? []  Nose bleeds? []  Sore throat?  Musculoskeletal:  []  Back pain? []  Joint pain? [x]  Muscle pain?   Physical Examination     Vitals:   06/23/19 1334  BP: 125/79  Pulse: 65  Resp: 16  Temp: 98.1 F (36.7 C)  TempSrc: Oral  SpO2: 100%  Weight: 190 lb (86.2 kg)  Height: 5\' 9"  (1.753 m)   Body mass index is 28.06 kg/m.  General Alert, O x 3, WD, NAD  Head [place]/AT,    Neck Supple, mid-line trachea,    Pulmonary Sym exp, good B air movt  Cardiac Regular  Vascular Vessel Right Left  Radial Palpable Palpable  DP Palpable Palpable    Musculo- skeletal M/S 5/5 throughout  , Extremities without ischemic changes  , No edema present, No visible varicosities , No Lipodermatosclerosis present;  some spider veins at L ankle; no impressive mass L calf  Neurologic Cranial nerves 2-12 intact, Motor exam as listed above  Psychiatric Judgement intact, Mood & affect appropriate for pt's clinical situation  Dermatologic See M/S exam for extremity exam, No rashes otherwise noted  Lymphatic  Palpable lymph nodes: None    Non-invasive Vascular Imaging   BLE Venous Insufficiency  Duplex (04/2019):   RLE:   (-) DVT and SVT,   (-) GSV reflux,   (-) SSV reflux,  (-) deep venous reflux  LLE:  (-) DVT and SVT,   (-) GSV reflux,   (-) SSV reflux,  (-) deep venous reflux   Medical Decision Making   Rebecca Silva is a 48 y.o. female who presents for evaluation of swelling and aching legs; L calf pain   No impressive mass, varicosity, or other structure in left calf on exam  Bilateral lower extremities are well perfused with palpable DP pulses  Bilateral venous reflux duplex performed prior to office visit was negative for DVT, superficial venous insufficiency, or deep venous insufficiency  She does have some venous congestion and spider veins especially left foot and ankle however no impressive or visible abnormalities bilateral lower extremities  Her occupation requires her to be at her desk for most of the day thus I suggested avoiding prolonged sitting or standing if at all possible  Also recommended elevation of bilateral lower extremities when possible and thigh-high compression  She was measured for thigh-high compression and was provided information for elastic therapy outlet  She may follow-up on an as-needed basis  Dagoberto Ligas PA-C [facility] Office: [phone]  06/23/2019, 2:08 PM  Clinic MD: Dr. Donzetta Matters

## 2019-10-09 DIAGNOSIS — R569 Unspecified convulsions: Secondary | ICD-10-CM | POA: Diagnosis not present

## 2019-10-09 DIAGNOSIS — N926 Irregular menstruation, unspecified: Secondary | ICD-10-CM | POA: Diagnosis not present

## 2019-10-09 DIAGNOSIS — E063 Autoimmune thyroiditis: Secondary | ICD-10-CM | POA: Diagnosis not present

## 2019-10-09 DIAGNOSIS — E039 Hypothyroidism, unspecified: Secondary | ICD-10-CM | POA: Diagnosis not present

## 2019-10-13 DIAGNOSIS — E559 Vitamin D deficiency, unspecified: Secondary | ICD-10-CM | POA: Diagnosis not present

## 2019-10-13 DIAGNOSIS — N951 Menopausal and female climacteric states: Secondary | ICD-10-CM | POA: Diagnosis not present

## 2019-10-13 DIAGNOSIS — Z1321 Encounter for screening for nutritional disorder: Secondary | ICD-10-CM | POA: Diagnosis not present

## 2019-10-13 DIAGNOSIS — E039 Hypothyroidism, unspecified: Secondary | ICD-10-CM | POA: Diagnosis not present

## 2019-10-13 DIAGNOSIS — R569 Unspecified convulsions: Secondary | ICD-10-CM | POA: Diagnosis not present

## 2019-10-13 DIAGNOSIS — N926 Irregular menstruation, unspecified: Secondary | ICD-10-CM | POA: Diagnosis not present

## 2019-10-13 DIAGNOSIS — E063 Autoimmune thyroiditis: Secondary | ICD-10-CM | POA: Diagnosis not present

## 2019-10-18 DIAGNOSIS — N926 Irregular menstruation, unspecified: Secondary | ICD-10-CM | POA: Diagnosis not present

## 2019-10-18 DIAGNOSIS — R569 Unspecified convulsions: Secondary | ICD-10-CM | POA: Diagnosis not present

## 2019-10-18 DIAGNOSIS — N951 Menopausal and female climacteric states: Secondary | ICD-10-CM | POA: Diagnosis not present

## 2019-11-03 DIAGNOSIS — E639 Nutritional deficiency, unspecified: Secondary | ICD-10-CM | POA: Diagnosis not present

## 2019-11-03 DIAGNOSIS — J45909 Unspecified asthma, uncomplicated: Secondary | ICD-10-CM | POA: Diagnosis not present

## 2019-11-03 DIAGNOSIS — E063 Autoimmune thyroiditis: Secondary | ICD-10-CM | POA: Diagnosis not present

## 2019-11-03 DIAGNOSIS — N926 Irregular menstruation, unspecified: Secondary | ICD-10-CM | POA: Diagnosis not present

## 2019-11-16 DIAGNOSIS — N924 Excessive bleeding in the premenopausal period: Secondary | ICD-10-CM | POA: Diagnosis not present

## 2019-12-05 ENCOUNTER — Ambulatory Visit: Payer: BLUE CROSS/BLUE SHIELD | Admitting: Internal Medicine

## 2019-12-15 ENCOUNTER — Ambulatory Visit: Payer: BC Managed Care – PPO | Admitting: Internal Medicine

## 2019-12-27 NOTE — Progress Notes (Signed)
NEUROLOGY FOLLOW UP OFFICE NOTE  JACQELINE Silva [id number]  HISTORY OF PRESENT ILLNESS: Rebecca Silva is a 49 year old right-handed woman with history of Hashimoto's thyroiditis, asthma and Wolff-Parkinson-White who follows up for primary generalized epilepsy with absence seizures  UPDATE: She takes Keppra750mg  in AM, 750mg  at noon and 1500mg  at bedtime.She splits the morning dose of Keppra from 1500mg  twice daily to 750mg  in AM, 750mg  at noon because the full 1500mg  in the morning makes her sleepy.She is doing well.  No recurrent seizures.  Tremors improved after change in thyroid medication.  HISTORY: She has had these episodes of eye flutter at least since adolescence.Suddenly, both of her eyes will roll back for one or two seconds.She is not aware that it is happening.Initially, they occured several times a day, every few minutes.There is a question about whether she loses awareness during an episode.If you start a sentence when it occurs, she is not aware of what you said.If she is speaking, she will stop and say "um" when it occurs.She denies headache or focal abnormalities.She denies any abnormal movements or gait abnormalities.They occur spontaneously and not triggered or relieved by anything particular. She says that her mom has it but less severe.Her grandfather had epilepsy. She had an ambulatory EEG, which showed brief intermittent bursts of generalized slowing with imbedded sharp waves, suggestive of primary generalized epilepsy. She also had an MRI of the brain performed on 12/13/14, which was personally reviewed and showed no evidence of acute infarct, mass lesion, hemorrhage or mesial temporal sclerosis.  She reports that for the past few years, her left hand is sometimes shaky when she holds something in her left hand such as a mug or making a fist.  It does not occur at rest.   Does not occur in right hand.  She denies difficulty moving her arms,  hands or feet.  No gait abnormality.  No difficulty swallowing.  Sense of smell is okay.  She denies family history of tremors.  PAST MEDICAL HISTORY: Past Medical History:  Diagnosis Date  . Asthma   . Heart murmur   . History of UTI   . Hypothyroidism   . Seasonal allergies   . Seizure disorder ([place])   . Urine incontinence   . Wolff-Parkinson-White pattern    a. noted on EKG in 07/2016. Echo showed a preserved EF of 55-60% with no regional WMA. ETT showed WPW waveforms with no evidence of HB.     MEDICATIONS: Current Outpatient Medications on File Prior to Visit  Medication Sig Dispense Refill  . albuterol (PROAIR HFA) 108 (90 Base) MCG/ACT inhaler UP  2 puffs every 4 hours as needed 8.5 g 1  . budesonide-formoterol (SYMBICORT) 80-4.5 MCG/ACT inhaler Take 2 puffs first thing in am and then another 2 puffs about 12 hours later. 10.2 g 11  . cholecalciferol (VITAMIN D3) 25 MCG (1000 UT) tablet Take 5,000 Units by mouth daily.    . ferrous sulfate 325 (65 FE) MG EC tablet Take 325 mg by mouth daily.     Marland Kitchen levETIRAcetam (KEPPRA) 750 MG tablet 1 pill in morning, 1 pill afternoon and 2 pills at bedtime 360 tablet 3  . Multiple Vitamin (MULTIVITAMIN) tablet Take 1 tablet by mouth daily.    Marland Kitchen SYNTHROID 150 MCG tablet TAKE ONE TABLET BY MOUTH EVERY MORNING 60 tablet 4   No current facility-administered medications on file prior to visit.    ALLERGIES: No Known Allergies  FAMILY HISTORY: Family History  Problem Relation Age of Onset  . Heart disease Mother 71       open heart surgery  . Heart disease Maternal Grandmother   . Epilepsy Maternal Grandfather    SOCIAL HISTORY: Social History   Socioeconomic History  . Marital status: Married    Spouse name: Not on file  . Number of children: Not on file  . Years of education: Not on file  . Highest education level: Not on file  Occupational History  . Occupation: Paralegal   Tobacco Use  . Smoking status: Never Smoker  .  Smokeless tobacco: Never Used  Substance and Sexual Activity  . Alcohol use: No    Alcohol/week: 0.0 standard drinks  . Drug use: No  . Sexual activity: Yes    Partners: Male  Other Topics Concern  . 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 Difficulty of Paying Living Expenses:   Food Insecurity:   . Worried About Charity fundraiser in the Last Year:   . Arboriculturist in the Last Year:   Transportation Needs:   . Film/video editor (Medical):   Marland Kitchen Lack of Transportation (Non-Medical):   Physical Activity:   . Days of Exercise per Week:   . Minutes of Exercise per Session:   Stress:   . Feeling of Stress :   Social Connections:   . Frequency of Communication with Friends and Family:   . Frequency of Social Gatherings with Friends and Family:   . Attends Religious Services:   . Active Member of Clubs or Organizations:   . Attends Archivist Meetings:   Marland Kitchen Marital Status:   Intimate Partner Violence:   . Fear of Current or Ex-Partner:   . Emotionally Abused:   Marland Kitchen Physically Abused:   . Sexually Abused:     PHYSICAL EXAM: Blood pressure 136/86, pulse 66, height 5\' 8"  (1.727 m), weight 195 lb 3.2 oz (88.5 kg), SpO2 98 %. General: No acute distress.  Patient appears well-groomed.   Head:  Normocephalic/atraumatic Eyes:  Fundi examined but not visualized Neck: supple, no paraspinal tenderness, full range of motion Heart:  Regular rate and rhythm Lungs:  Clear to auscultation bilaterally Back: No paraspinal tenderness Neurological Exam: alert and oriented to person, place, and time. Attention span and concentration intact, recent and remote memory intact, fund of knowledge intact.  Speech fluent and not dysarthric, language intact.  CN II-XII intact.  Bulk and tone normal, muscle strength 5/5 throughout.  Sensation to light touch, temperature and vibration intact.  Deep tendon reflexes 2+ throughout, toes downgoing.  Finger to nose and heel to shin testing intact.  Gait normal, Romberg negative.  IMPRESSION: 1.  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  PLAN: 1.  Keppra 750mg  in AM, 750mg  in noon and 1500mg  at bedtime (refilled today). 2.  Follow up in one year.  Metta Clines, DO  CC: Pricilla Holm, MD

## 2019-12-29 ENCOUNTER — Ambulatory Visit (INDEPENDENT_AMBULATORY_CARE_PROVIDER_SITE_OTHER): Payer: BC Managed Care – PPO | Admitting: Neurology

## 2019-12-29 ENCOUNTER — Encounter: Payer: Self-pay | Admitting: Neurology

## 2019-12-29 ENCOUNTER — Other Ambulatory Visit: Payer: Self-pay

## 2019-12-29 VITALS — BP 136/86 | HR 66 | Ht 68.0 in | Wt 195.2 lb

## 2019-12-29 DIAGNOSIS — G40309 Generalized idiopathic epilepsy and epileptic syndromes, not intractable, without status epilepticus: Secondary | ICD-10-CM | POA: Diagnosis not present

## 2019-12-29 MED ORDER — LEVETIRACETAM 750 MG PO TABS: ORAL_TABLET | ORAL | 3 refills | Status: DC

## 2019-12-29 NOTE — Patient Instructions (Signed)
levetiracetam refilled Follow up in one year

## 2020-01-16 DIAGNOSIS — E039 Hypothyroidism, unspecified: Secondary | ICD-10-CM | POA: Diagnosis not present

## 2020-01-16 DIAGNOSIS — R5383 Other fatigue: Secondary | ICD-10-CM | POA: Diagnosis not present

## 2020-01-26 ENCOUNTER — Other Ambulatory Visit: Payer: Self-pay | Admitting: Internal Medicine

## 2020-01-26 DIAGNOSIS — J4540 Moderate persistent asthma, uncomplicated: Secondary | ICD-10-CM

## 2020-02-02 ENCOUNTER — Ambulatory Visit (INDEPENDENT_AMBULATORY_CARE_PROVIDER_SITE_OTHER): Payer: BC Managed Care – PPO | Admitting: Internal Medicine

## 2020-02-02 ENCOUNTER — Encounter: Payer: Self-pay | Admitting: Internal Medicine

## 2020-02-02 ENCOUNTER — Other Ambulatory Visit: Payer: Self-pay

## 2020-02-02 DIAGNOSIS — J4540 Moderate persistent asthma, uncomplicated: Secondary | ICD-10-CM

## 2020-02-02 MED ORDER — ALBUTEROL SULFATE HFA 108 (90 BASE) MCG/ACT IN AERS: INHALATION_SPRAY | RESPIRATORY_TRACT | 1 refills | Status: DC

## 2020-02-02 MED ORDER — BUDESONIDE-FORMOTEROL FUMARATE 80-4.5 MCG/ACT IN AERO: INHALATION_SPRAY | RESPIRATORY_TRACT | 11 refills | Status: DC

## 2020-02-02 NOTE — Assessment & Plan Note (Addendum)
Onset Dec 2014 - 07/24/2014   > trial of qvar 80 2bid - PFTs 11/01/2014 FEV1  2.36 (69%) ratio 74 p 33% improvement from saba/ dlco 80% -  11/01/2014 p extensive coaching HFA effectiveness =    75% > try symbicort 160 2bid > marked clinical  improvement 02/08/15  -  06/10/2016    try symb 80 2bid > all symptoms resolved  - 02/02/2020  After extensive coaching inhaler device,  effectiveness =    85% > continue symbicort 80  1-2 each am  And pm doses optional   All goals of chronic asthma control met including optimal function and elimination of symptoms with minimal need for rescue therapy.  Contingencies discussed in full including contacting this office immediately if not controlling the symptoms using the rule of two's.     Based on two studies from NEJM  378; 20 p 1865 (2018) and 380 : p2020-30 (2019) in pts with mild asthma it is reasonable to use low dose symbicort eg 80 2bid "prn" flare in this setting but I emphasized this was only shown with symbicort and takes advantage of the rapid onset of action but is not the same as "rescue therapy" but can be stopped once the acute symptoms have resolved and the need for rescue has been minimized (< 2 x weekly)    Reviewed rule of 2's If your breathing worsens or you need to use your rescue inhaler more than twice weekly or wake up more than twice a month with any respiratory symptoms or require more than two rescue inhalers per year, we need to see you right away because this means we're not controlling the underlying problem (inflammation) adequately.  Rescue inhalers (albuterol) do not control inflammation and overuse can lead to unnecessary and costly consequences.  They can make you feel better temporarily but eventually they will quit working effectively much as sleep aids lead to more insomnia if used regularly.     F/u q 12 m, sooner prn           Each maintenance medication was reviewed in detail including emphasizing most importantly the  difference between maintenance and prns and under what circumstances the prns are to be triggered using an action plan format where appropriate.  Total time for H and P, chart review, counseling, teaching device and generating customized AVS unique to this office visit / charting = 20 min

## 2020-02-02 NOTE — Progress Notes (Signed)
Subjective:    Patient ID: Rebecca Silva, female    DOB: 1970-09-21   MRN: [id number]    Brief patient profile:  73 yowf never smoker with tendency to bronchitis all her life sev times a year never required prednisone or breathing treatments until mid 12s but started then on prn saba  But never needed maint inhaler or more than twice weekly saba but since Dec 2014 needed it more often as much as twice daily then admitted:   Admit date: 06/06/2014 Discharge date: 06/08/2014   Discharge Diagnoses:  Principal Problem:  Asthma exacerbation   Hypothyroidism  Leukocytosis  Viral URI with cough  Tachycardia    History of Present Illness  06/20/2014 1st [facility] office visit/ Rebecca Silva  / referred by [facility] after above admit  Chief Complaint  Patient presents with  . Pulmonary Consult    Self referral. Pt states dxed with Asthma approx 10 yrs ago. She c/o SOB and cough since mid Nov 2015.  Her cough is prod with minimal yellow sputum.  She is using rescue inhaler on average 2 x per day.     much better since admit but note over use of saba at baseline and still doe x exertion but not at rest/ sleeping or adls rec Plan A = automatic = qvar 80 Take 2 puffs first thing in am and then another 2 puffs about 12 hours later (other option is dulera but probably don't need it)  Plan B = backup saba hfa Plan C= Crisis = nebulizer if plan A and B don't work  Plan D = Doctor, call me if not satisfied with the above       11/01/2014 f/u ov/Rebecca Silva re: moderate persistent asthma  qvar 80 2 bid / more sob than cough  Chief Complaint  Patient presents with  . Follow-up    PFT done today. Pt states that her breathing is unchanged since her last visit. She uses rescue inhaler 1-2 x per wk. She c/o Qvar sometimes making her feel that she is about to have an asthma attack.   marked decrease in need for saba since starting qvar / no noct flares  rec Change qvar to symbicort 160 Take 2 puffs  first thing in am and then another 2 puffs about 12 hours later.  Only use your albuterol as a rescue medication     02/08/2015 f/u ov/Rebecca Silva re: chronic asthma  Chief Complaint  Patient presents with  . Follow-up    Pt states breathing is doing well. She uses albuterol about once per month.   Not limited by breathing from desired activities   If the drugstore doesn't honor your zero coupon please let me know  Continue symbicort 160 Take 2 puffs first thing in am and then another 2 puffs about 12 hours later.  Only use your albuterol as a rescue medication     12/05/2018  f/u ov/Rebecca Silva re: chronic asthma/ weaned herself down to symb 80 1 bid  Chief Complaint  Patient presents with  . Follow-up    Breathing is doing well. She rarely uses her rescue inhaler or neb.   Dyspnea:  Still doing some running  In all conditioning Cough: no Sleeping: able to lie flat wakes up with bitemple ha  SABA use: rarely 02: none  rec Plan A = Automatic =  Symbicort 80 up to 2 puffs every 12 hours as needed  Plan B = Backup Only use your albuterol inhaler as a  rescue medication     02/02/2020  f/u ov/Rebecca Silva re: asthma /  2nd pfizer October 24 2019 / maint symb 80 2 q am  Chief Complaint  Patient presents with  . Follow-up  Dyspnea:  Still  running 26miles in 45 hours  Cough: no  Sleeping: bed is flat / one pillow , no HA  SABA use: none   02: none    No obvious day to day or daytime variability or assoc excess/ purulent sputum or mucus plugs or hemoptysis or cp or chest tightness, subjective wheeze or overt sinus or hb symptoms.   Sleeping ok without nocturnal  or early am exacerbation  of respiratory  c/o's or need for noct saba. Also denies any obvious fluctuation of symptoms with weather or environmental changes or other aggravating or alleviating factors except as outlined above   No unusual exposure hx or h/o childhood pna/ asthma or knowledge of premature birth.  Current Allergies, Complete  Past Medical History, Past Surgical History, Family History, and Social History were reviewed in [employer] record.  ROS  The following are not active complaints unless bolded Hoarseness, sore throat, dysphagia, dental problems, itching, sneezing,  nasal congestion or discharge of excess mucus or purulent secretions, ear ache,   fever, chills, sweats, unintended wt loss or wt gain, classically pleuritic or exertional cp,  orthopnea pnd or arm/hand swelling  or leg swelling, presyncope, palpitations, abdominal pain, anorexia, nausea, vomiting, diarrhea  or change in bowel habits or change in bladder habits, change in stools or change in urine, dysuria, hematuria,  rash, arthralgias, visual complaints, headache, numbness, weakness or ataxia or problems with walking or coordination,  change in mood or  memory.        Current Meds  Medication Sig  . albuterol (PROAIR HFA) 108 (90 Base) MCG/ACT inhaler UP  2 puffs every 4 hours as needed  . budesonide-formoterol (SYMBICORT) 80-4.5 MCG/ACT inhaler TAKE 2 PUFFS BY MOUTH FIRST THING IN THE MORNING AND THEN 2 PUFFS ABOUT 12 HOUS LATER.  . cholecalciferol (VITAMIN D3) 25 MCG (1000 UT) tablet Take 5,000 Units by mouth daily. 1-2 times a week  . levETIRAcetam (KEPPRA) 750 MG tablet 1 pill in morning, 1 pill afternoon and 2 pills at bedtime  . Multiple Vitamin (MULTIVITAMIN) tablet Take 1 tablet by mouth daily.  . NP THYROID 90 MG tablet Take 90 mg by mouth at bedtime.  . [DISCONTINUED] ferrous sulfate 325 (65 FE) MG EC tablet Take 325 mg by mouth daily.               Objective:   Physical Exam     02/02/2020  195  12/05/2018   192  10/20/2017     188   02/08/2015   198   11/01/2014  199 07/24/2014     200       06/20/14 199 lb (90.266 kg)  06/06/14 196 lb (88.905 kg)  06/06/14 196 lb (88.905 kg)     Vital signs reviewed  02/02/2020  - Note at rest 02 sats  99% on RA   HEENT : pt wearing mask not removed for exam due to covid  -19 concerns.    NECK :  without JVD/Nodes/TM/ nl carotid upstrokes bilaterally   LUNGS: no acc muscle use,  Nl contour chest which is clear to A and P bilaterally without cough on insp or exp maneuvers   CV:  RRR  no s3 or murmur or increase in P2, and  no edema   ABD:  soft and nontender with nl inspiratory excursion in the supine position. No bruits or organomegaly appreciated, bowel sounds nl  MS:  Nl gait/ ext warm without deformities, calf tenderness, cyanosis or clubbing No obvious joint restrictions   SKIN: warm and dry without lesions    NEURO:  alert, approp, nl sensorium with  no motor or cerebellar deficits apparent.                Assessment & Plan:

## 2020-02-02 NOTE — Patient Instructions (Addendum)
Plan A = Automatic = Always=    symbicort 80 1-2 puffs each am then 1-2 puff 12 hours later are optional  Plan B = Backup (to supplement plan A, not to replace it) Only use your albuterol inhaler as a rescue medication to be used if you can't catch your breath by resting or doing a relaxed purse lip breathing pattern.  - The less you use it, the better it will work when you need it. - Ok to use the inhaler up to 2 puffs  every 4 hours if you must but call for appointment if use goes up over your usual need - Don't leave home without it !!  (think of it like the spare tire for your car)    Please schedule a follow up visit in 12 months but call sooner if needed

## 2020-03-21 DIAGNOSIS — R5383 Other fatigue: Secondary | ICD-10-CM | POA: Diagnosis not present

## 2020-03-21 DIAGNOSIS — E039 Hypothyroidism, unspecified: Secondary | ICD-10-CM | POA: Diagnosis not present

## 2020-03-28 DIAGNOSIS — G40909 Epilepsy, unspecified, not intractable, without status epilepticus: Secondary | ICD-10-CM | POA: Diagnosis not present

## 2020-03-28 DIAGNOSIS — J45909 Unspecified asthma, uncomplicated: Secondary | ICD-10-CM | POA: Diagnosis not present

## 2020-03-28 DIAGNOSIS — R569 Unspecified convulsions: Secondary | ICD-10-CM | POA: Diagnosis not present

## 2020-03-28 DIAGNOSIS — N926 Irregular menstruation, unspecified: Secondary | ICD-10-CM | POA: Diagnosis not present

## 2020-04-05 DIAGNOSIS — R569 Unspecified convulsions: Secondary | ICD-10-CM | POA: Diagnosis not present

## 2020-04-05 DIAGNOSIS — J45909 Unspecified asthma, uncomplicated: Secondary | ICD-10-CM | POA: Diagnosis not present

## 2020-04-05 DIAGNOSIS — G40909 Epilepsy, unspecified, not intractable, without status epilepticus: Secondary | ICD-10-CM | POA: Diagnosis not present

## 2020-04-05 DIAGNOSIS — E559 Vitamin D deficiency, unspecified: Secondary | ICD-10-CM | POA: Diagnosis not present

## 2020-04-05 DIAGNOSIS — N926 Irregular menstruation, unspecified: Secondary | ICD-10-CM | POA: Diagnosis not present

## 2020-09-09 ENCOUNTER — Encounter: Payer: Self-pay | Admitting: Orthopedic Surgery

## 2020-09-23 ENCOUNTER — Encounter: Payer: Self-pay | Admitting: Orthopedic Surgery

## 2020-09-23 ENCOUNTER — Ambulatory Visit: Payer: Self-pay

## 2020-09-23 ENCOUNTER — Ambulatory Visit (INDEPENDENT_AMBULATORY_CARE_PROVIDER_SITE_OTHER): Payer: BC Managed Care – PPO | Admitting: Orthopedic Surgery

## 2020-09-23 DIAGNOSIS — M542 Cervicalgia: Secondary | ICD-10-CM | POA: Diagnosis not present

## 2020-09-23 DIAGNOSIS — M545 Low back pain, unspecified: Secondary | ICD-10-CM | POA: Diagnosis not present

## 2020-09-23 MED ORDER — METHOCARBAMOL 500 MG PO TABS
ORAL_TABLET | ORAL | 0 refills | Status: DC
Start: 2020-09-23 — End: 2021-10-29

## 2020-09-23 MED ORDER — METHYLPREDNISOLONE 4 MG PO TBPK
ORAL_TABLET | ORAL | 0 refills | Status: DC
Start: 2020-09-23 — End: 2020-12-30

## 2020-09-23 NOTE — Addendum Note (Signed)
Addended by: Precious Bard on: 09/23/2020 04:29 PM   Modules accepted: Orders

## 2020-09-23 NOTE — Progress Notes (Signed)
Office Visit Note   Patient: Rebecca Silva           Date of Birth: 09/18/1970           MRN: [id number] Visit Date: 09/23/2020 Requested by: Hoyt Koch, MD [address] [place],  [place] [id number] PCP: Hoyt Koch, MD  Subjective: Chief Complaint  Patient presents with  . Neck - Pain  . Lower Back - Pain    HPI: She is a 50 year old patient with low back pain and bilateral proximal leg pain left worse than right.  This has been going on for about a month.  Denies any history of injury.  Her old chart is reviewed.  She did have a herniated disc in her neck about 3 years ago.  She did well with that.  Arm numbness and radicular symptoms have improved.  Never got an injection.  Takes occasional naproxen for this problem.  She commutes 1 hour sitting each way to work and she sits at work.  She likes to walk for exercise but she also can run to some degree without too much pain.              ROS: All systems reviewed are negative as they relate to the chief complaint within the history of present illness.  Patient denies  fevers or chills.   Assessment & Plan: Visit Diagnoses:  1. Neck pain   2. Low back pain, unspecified back pain laterality, unspecified chronicity, unspecified whether sciatica present     Plan: Impression is low back pain with likely some bulging central disc and possible facet arthritis.  No nerve root tension signs today.  No weakness.  For 1 month of symptoms we will try Medrol Dosepak and Robaxin.  If that fails to alleviate her symptoms then I would also favor subsequent MRI scanning.  We discussed stretching and a home exercise program to do as well which will be done prior to doing any type of MRI scanning.  She will call if this intervention does not help her symptoms.  I think it is in her favor that she was able to get over her neck herniated disc without too much of an fair.  Follow-Up Instructions: Return if symptoms worsen or fail  to improve.   Orders:  Orders Placed This Encounter  Procedures  . XR Lumbar Spine 2-3 Views   No orders of the defined types were placed in this encounter.     Procedures: No procedures performed   Clinical Data: No additional findings.  Objective: Vital Signs: There were no vitals taken for this visit.  Physical Exam:   Constitutional: Patient appears well-developed HEENT:  Head: Normocephalic Eyes:EOM are normal Neck: Normal range of motion Cardiovascular: Normal rate Pulmonary/chest: Effort normal Neurologic: Patient is alert Skin: Skin is warm Psychiatric: Patient has normal mood and affect    Ortho Exam: Ortho exam demonstrates no nerve root tension signs.  No groin pain with internal X rotation leg.  Palpable pedal pulses.  Patient has 5 out of 5 ankle dorsiflexion plantarflexion quad hamstring abduction adduction strength.  No masses lymphadenopathy or skin changes noted in the back or leg region.  No paresthesias L1 S1 bilaterally.  Reflexes symmetric 1+ out of 4 bilateral patella and Achilles.  Mild stiffness with forward and lateral bending.  No discrete trochanteric tenderness present.  Specialty Comments:  No specialty comments available.  Imaging: XR Lumbar Spine 2-3 Views  Result Date: 09/23/2020 AP lateral  lumbar spine reviewed.  Normal lordosis is present.  The spaces maintained throughout the lumbar spine.  Facet arthritis is present at L5-S1.  No spondylolisthesis or compression fractures.  Visualized hips appear to have minimal arthritis.    PMFS History: Patient Active Problem List   Diagnosis Date Noted  . Pain of left calf 06/23/2019  . Leg swelling 04/06/2019  . Urinary incontinence 12/27/2018  . Blood in urine 12/27/2018  . Change in bowel habits 04/05/2018  . Thyroiditis, chronic 12/02/2017  . Allergic rhinitis 12/18/2016  . Wolff-Parkinson-White (WPW) pattern 07/30/2016  . Routine general medical examination at a health care  facility 04/15/2015  . Generalized idiopathic epilepsy, not intractable, without status epilepticus ([place]) 03/18/2015  . Vitamin D deficiency 10/10/2014  . Moderate persistent asthma without complication 38/04/1750  . Hypothyroidism 05/19/2013   Past Medical History:  Diagnosis Date  . Asthma   . Heart murmur   . History of UTI   . Hypothyroidism   . Seasonal allergies   . Seizure disorder ([place])   . Urine incontinence   . Wolff-Parkinson-White pattern    a. noted on EKG in 07/2016. Echo showed a preserved EF of 55-60% with no regional WMA. ETT showed WPW waveforms with no evidence of HB.     Family History  Problem Relation Age of Onset  . Heart disease Mother 35       open heart surgery  . Heart disease Maternal Grandmother   . Epilepsy Maternal Grandfather     History reviewed. No pertinent surgical history. Social History   Occupational History  . Occupation: Paralegal   Tobacco Use  . Smoking status: Never Smoker  . Smokeless tobacco: Never Used  Vaping Use  . Vaping Use: Never used  Substance and Sexual Activity  . Alcohol use: No    Alcohol/week: 0.0 standard drinks  . Drug use: No  . Sexual activity: Yes    Partners: Male

## 2020-12-29 NOTE — Progress Notes (Signed)
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  Very mild.  Not appreciated on exam today.   Keppra 750mg  in AM and 1500mg  at bedtime - refilled Monitor tremor Follow up in one year.  Subjective:  Rebecca Silva is a 50 year old right-handed woman with history of Hashimoto's thyroiditis, asthma and Wolff-Parkinson-White who follows up for primary generalized epilepsy with absence seizures     UPDATE: She takes Keppra 750mg  in AM, 750mg  at noon and 1500mg  at bedtime.  She splits the morning dose of Keppra from 1500mg  twice daily to 750mg  in AM, 750mg  at noon because the full 1500mg  in the morning makes her sleepy.  She is doing well.  No recurrent seizures.   Tremors improved after change in thyroid medication.  However, she notices it a little more often.  If she holds a coffee cup, it is not steady.  It doesn't affect her writing or using utensils.   HISTORY: She has had these episodes of eye flutter at least since adolescence.  Suddenly, both of her eyes will roll back for one or two seconds.  She is not aware that it is happening.  Initially, they occured several times a day, every few minutes.  There is a question about whether she loses awareness during an episode.  If you start a sentence when it occurs, she is not aware of what you said.  If she is speaking, she will stop and say "um" when it occurs.  She denies headache or focal abnormalities.  She denies any abnormal movements or gait abnormalities.  They occur spontaneously and not triggered or relieved by anything particular.  She says that her mom has it but less severe.  Her grandfather had epilepsy.  She had an ambulatory EEG, which showed brief intermittent bursts of generalized slowing with imbedded sharp waves, suggestive of primary generalized epilepsy.  She also had an MRI of the  brain performed on 12/13/14, which was personally reviewed and showed no evidence of acute infarct, mass lesion, hemorrhage or mesial temporal sclerosis.   She reports that for the past few years, her left hand is sometimes shaky when she holds something in her left hand such as a mug or making a fist.  It does not occur at rest.   Does not occur in right hand.  She denies difficulty moving her arms, hands or feet.  No gait abnormality.  No difficulty swallowing.  Sense of smell is okay.  She denies family history of tremors.  PAST MEDICAL HISTORY: Past Medical History:  Diagnosis Date   Asthma    Heart murmur    History of UTI    Hypothyroidism    Seasonal allergies    Seizure disorder ([place])    Urine incontinence    Wolff-Parkinson-White pattern    a. noted on EKG in 07/2016. Echo showed a preserved EF of 55-60% with no regional WMA. ETT showed WPW waveforms with no evidence of HB.     MEDICATIONS: Current Outpatient Medications on File Prior to Visit  Medication Sig Dispense Refill   albuterol (PROAIR HFA) 108 (90 Base) MCG/ACT inhaler UP  2 puffs every 4 hours as needed 18 g 1   budesonide-formoterol (SYMBICORT) 80-4.5 MCG/ACT inhaler TAKE 2 PUFFS BY MOUTH FIRST THING IN THE MORNING AND THEN 2 PUFFS ABOUT 12 HOUS LATER. 10.2 g 11   cholecalciferol (VITAMIN D3) 25  MCG (1000 UT) tablet Take 5,000 Units by mouth daily. 1-2 times a week     levETIRAcetam (KEPPRA) 750 MG tablet 1 pill in morning, 1 pill afternoon and 2 pills at bedtime 360 tablet 3   methocarbamol (ROBAXIN) 500 MG tablet Take 1 tab po q12 prn pain 30 tablet 0   methylPREDNISolone (MEDROL DOSEPAK) 4 MG TBPK tablet TAKE AS DIRECTED 21 tablet 0   Multiple Vitamin (MULTIVITAMIN) tablet Take 1 tablet by mouth daily.     NP THYROID 90 MG tablet Take 90 mg by mouth at bedtime.     No current facility-administered medications on file prior to visit.    ALLERGIES: No Known Allergies  FAMILY HISTORY: Family History  Problem  Relation Age of Onset   Heart disease Mother 10       open heart surgery   Heart disease Maternal Grandmother    Epilepsy Maternal Grandfather       Objective:  Blood pressure 129/80, pulse 73, height 5\' 9"  (1.753 m), weight 196 lb 3.2 oz (89 kg), SpO2 98 %. General: No acute distress.  Patient appears well-groomed.   Head:  Normocephalic/atraumatic Eyes:  Fundi examined but not visualized Neck: supple, no paraspinal tenderness, full range of motion Heart:  Regular rate and rhythm Lungs:  Clear to auscultation bilaterally Back: No paraspinal tenderness Neurological Exam: alert and oriented to person, place, and time.  Speech fluent and not dysarthric, language intact.  CN II-XII intact. Bulk and tone normal, muscle strength 5/5 throughout.  Sensation to light touch intact.  Deep tendon reflexes 2+ throughout.  Finger to nose testing intact.  Gait normal, Romberg negative.   Rebecca Clines, DO  CC: Rebecca Holm, MD

## 2020-12-30 ENCOUNTER — Ambulatory Visit (INDEPENDENT_AMBULATORY_CARE_PROVIDER_SITE_OTHER): Payer: BC Managed Care – PPO | Admitting: Neurology

## 2020-12-30 ENCOUNTER — Encounter: Payer: Self-pay | Admitting: Neurology

## 2020-12-30 ENCOUNTER — Other Ambulatory Visit: Payer: Self-pay

## 2020-12-30 VITALS — BP 129/80 | HR 73 | Ht 69.0 in | Wt 196.2 lb

## 2020-12-30 DIAGNOSIS — G40309 Generalized idiopathic epilepsy and epileptic syndromes, not intractable, without status epilepticus: Secondary | ICD-10-CM

## 2020-12-30 DIAGNOSIS — R251 Tremor, unspecified: Secondary | ICD-10-CM

## 2020-12-30 MED ORDER — LEVETIRACETAM 750 MG PO TABS: ORAL_TABLET | ORAL | 3 refills | Status: DC

## 2020-12-30 NOTE — Patient Instructions (Signed)
Refilled levetiracetam Monitor tremor

## 2021-02-03 ENCOUNTER — Ambulatory Visit: Payer: BC Managed Care – PPO | Admitting: Internal Medicine

## 2021-02-11 ENCOUNTER — Encounter: Payer: Self-pay | Admitting: Internal Medicine

## 2021-02-11 ENCOUNTER — Ambulatory Visit (INDEPENDENT_AMBULATORY_CARE_PROVIDER_SITE_OTHER): Payer: BC Managed Care – PPO | Admitting: Internal Medicine

## 2021-02-11 ENCOUNTER — Other Ambulatory Visit: Payer: Self-pay

## 2021-02-11 DIAGNOSIS — J4540 Moderate persistent asthma, uncomplicated: Secondary | ICD-10-CM | POA: Diagnosis not present

## 2021-02-11 MED ORDER — BUDESONIDE-FORMOTEROL FUMARATE 80-4.5 MCG/ACT IN AERO: INHALATION_SPRAY | RESPIRATORY_TRACT | 11 refills | Status: DC

## 2021-02-11 NOTE — Assessment & Plan Note (Signed)
Onset Dec 2014 - 07/24/2014   > trial of qvar 80 2bid - PFTs 11/01/2014 FEV1  2.36 (69%) ratio 74 p 33% improvement from saba/ dlco 80% -  11/01/2014 p extensive coaching HFA effectiveness =    75% > try symbicort 160 2bid > marked clinical  improvement 02/08/15  -  06/10/2016    try symb 80 2bid > all symptoms resolved  - 02/02/2020  After extensive coaching inhaler device,  effectiveness =    85% > continue symbicort 80  1-2 each am  And pm doses optional   All goals of chronic asthma control met including optimal function and elimination of symptoms with minimal need for rescue therapy.  Contingencies discussed in full including contacting this office immediately if not controlling the symptoms using the rule of two's.     Needs to remember the ICS component takes a full week to work in event of worse symptoms or any need for saba.   F/u can be q year          Each maintenance medication was reviewed in detail including emphasizing most importantly the difference between maintenance and prns and under what circumstances the prns are to be triggered using an action plan format where appropriate.  Total time for H and P, chart review, counseling, reviewing hfa  device(s) and generating customized AVS unique to this office visit / same day charting = 22 min

## 2021-02-11 NOTE — Patient Instructions (Signed)
No change medications - just remember symbicort 80 should be taken 2 puffs every 12 hour until 100% better for a full week before taper   Please schedule a follow up visit in 12 months but call sooner if needed

## 2021-02-11 NOTE — Progress Notes (Signed)
Subjective:    Patient ID: Rebecca Silva, female    DOB: 1971/01/18   MRN: [id number]    Brief patient profile:  70 yowf never smoker with tendency to bronchitis all her life sev times a year never required prednisone or breathing treatments until mid 51s but started then on prn saba  But never needed maint inhaler or more than twice weekly saba but since Dec 2014 needed it more often as much as twice daily then admitted:   Admit date: 06/06/2014 Discharge date: 06/08/2014   Discharge Diagnoses:   Principal Problem:   Asthma exacerbation   Hypothyroidism   Leukocytosis   Viral URI with cough   Tachycardia    History of Present Illness  06/20/2014 1st [facility] office visit/ Rebecca Silva  / referred by [facility] after above admit  Chief Complaint  Patient presents with   Pulmonary Consult    Self referral. Pt states dxed with Asthma approx 10 yrs ago. She c/o SOB and cough since mid Nov 2015.  Her cough is prod with minimal yellow sputum.  She is using rescue inhaler on average 2 x per day.     much better since admit but note over use of saba at baseline and still doe x exertion but not at rest/ sleeping or adls rec Plan A = automatic = qvar 80 Take 2 puffs first thing in am and then another 2 puffs about 12 hours later (other option is dulera but probably don't need it)  Plan B = backup saba hfa Plan C= Crisis = nebulizer if plan A and B don't work  Plan D = Doctor, call me if not satisfied with the above       11/01/2014 f/u ov/Rebecca Silva re: moderate persistent asthma  qvar 80 2 bid / more sob than cough  Chief Complaint  Patient presents with   Follow-up    PFT done today. Pt states that her breathing is unchanged since her last visit. She uses rescue inhaler 1-2 x per wk. She c/o Qvar sometimes making her feel that she is about to have an asthma attack.   marked decrease in need for saba since starting qvar / no noct flares  rec Change qvar to symbicort 160 Take 2 puffs  first thing in am and then another 2 puffs about 12 hours later.  Only use your albuterol as a rescue medication     02/08/2015 f/u ov/Rebecca Silva re: chronic asthma  Chief Complaint  Patient presents with   Follow-up    Pt states breathing is doing well. She uses albuterol about once per month.   Not limited by breathing from desired activities   If the drugstore doesn't honor your zero coupon please let me know  Continue symbicort 160 Take 2 puffs first thing in am and then another 2 puffs about 12 hours later.  Only use your albuterol as a rescue medication     12/05/2018  f/u ov/Rebecca Silva re: chronic asthma/ weaned herself down to symb 80 1 bid  Chief Complaint  Patient presents with   Follow-up    Breathing is doing well. She rarely uses her rescue inhaler or neb.   Dyspnea:  Still doing some running  In all conditioning Cough: no Sleeping: able to lie flat wakes up with bitemple ha  SABA use: rarely 02: none  rec Plan A = Automatic =  Symbicort 80 up to 2 puffs every 12 hours as needed  Plan B = Backup Only use  your albuterol inhaler as a rescue medication     02/02/2020  f/u ov/Rebecca Silva re: asthma /  2nd pfizer October 24 2019 / maint symb 80 2 q am  Chief Complaint  Patient presents with   Follow-up  Dyspnea:  Still  running 74mles in 45 hours  Cough: no  Sleeping: bed is flat / one pillow , no HA  SABA use: none   02: none  Rec Plan A = Automatic = Always=    symbicort 80 1-2 puffs each am then 1-2 puff 12 hours later are optional Plan B = Backup (to supplement plan A, not to replace it) Only use your albuterol inhaler as a rescue medication   02/11/2021  f/u ov/Rebecca Silva re:  asthma 1 y f/u on symbicort 80 2 puffs q am  Chief Complaint  Patient presents with   Follow-up    Reports 1 yr f/u. No complaints voiced   Dyspnea:  occ runs  Cough: none  Sleeping: flat / one pillow SABA use: not  needing any  02: none  Covid status:   had covid May 2022 p vax x 3    No obvious day  to day or daytime variability or assoc excess/ purulent sputum or mucus plugs or hemoptysis or cp or chest tightness, subjective wheeze or overt sinus or hb symptoms.   Sleeping ok flat  without nocturnal  or early am exacerbation  of respiratory  c/o's or need for noct saba. Also denies any obvious fluctuation of symptoms with weather or environmental changes or other aggravating or alleviating factors except as outlined above   No unusual exposure hx or h/o childhood pna/ asthma or knowledge of premature birth.  Current Allergies, Complete Past Medical History, Past Surgical History, Family History, and Social History were reviewed in [employer].  ROS  The following are not active complaints unless bolded Hoarseness, sore throat, dysphagia, dental problems, itching, sneezing,  nasal congestion or discharge of excess mucus or purulent secretions, ear ache,   fever, chills, sweats, unintended wt loss or wt gain, classically pleuritic or exertional cp,  orthopnea pnd or arm/hand swelling  or leg swelling, presyncope, palpitations, abdominal pain, anorexia, nausea, vomiting, diarrhea  or change in bowel habits or change in bladder habits, change in stools or change in urine, dysuria, hematuria,  rash, arthralgias, visual complaints, headache, numbness, weakness or ataxia or problems with walking or coordination,  change in mood or  memory.        Current Meds  Medication Sig   albuterol (PROAIR HFA) 108 (90 Base) MCG/ACT inhaler UP  2 puffs every 4 hours as needed   budesonide-formoterol (SYMBICORT) 80-4.5 MCG/ACT inhaler TAKE 2 PUFFS BY MOUTH FIRST THING IN THE MORNING AND THEN 2 PUFFS ABOUT 12 HOUS LATER.   cholecalciferol (VITAMIN D3) 25 MCG (1000 UT) tablet Take 5,000 Units by mouth daily. 1-2 times a week   levETIRAcetam (KEPPRA) 750 MG tablet 1 pill in morning, 1 pill afternoon and 2 pills at bedtime   Multiple Vitamin (MULTIVITAMIN) tablet Take 1 tablet by mouth  daily.   NP THYROID 90 MG tablet Take 90 mg by mouth at bedtime.                      Objective:   Physical Exam   02/11/2021    197   02/02/2020  195  12/05/2018   192  10/20/2017     188   02/08/2015  198   11/01/2014  199 07/24/2014     200       06/20/14 199 lb (90.266 kg)  06/06/14 196 lb (88.905 kg)  06/06/14 196 lb (88.905 kg)     Vital signs reviewed  02/11/2021  - Note at rest 02 sats  97% on ra   General appearance:    pleasant amb wf nad   HEENT : pt wearing mask not removed for exam due to covid -19 concerns.    NECK :  without JVD/Nodes/TM/ nl carotid upstrokes bilaterally   LUNGS: no acc muscle use,  Nl contour chest which is clear to A and P bilaterally without cough on insp or exp maneuvers   CV:  RRR  no s3 or murmur or increase in P2, and no edema   ABD:  soft and nontender with nl inspiratory excursion in the supine position. No bruits or organomegaly appreciated, bowel sounds nl  MS:  Nl gait/ ext warm without deformities, calf tenderness, cyanosis or clubbing No obvious joint restrictions   SKIN: warm and dry without lesions    NEURO:  alert, approp, nl sensorium with  no motor or cerebellar deficits apparent.               Assessment & Plan:

## 2021-10-10 DIAGNOSIS — E063 Autoimmune thyroiditis: Secondary | ICD-10-CM | POA: Diagnosis not present

## 2021-10-10 DIAGNOSIS — R569 Unspecified convulsions: Secondary | ICD-10-CM | POA: Diagnosis not present

## 2021-10-10 DIAGNOSIS — E039 Hypothyroidism, unspecified: Secondary | ICD-10-CM | POA: Diagnosis not present

## 2021-10-10 DIAGNOSIS — G40909 Epilepsy, unspecified, not intractable, without status epilepticus: Secondary | ICD-10-CM | POA: Diagnosis not present

## 2021-10-10 DIAGNOSIS — E538 Deficiency of other specified B group vitamins: Secondary | ICD-10-CM | POA: Diagnosis not present

## 2021-10-15 DIAGNOSIS — G40909 Epilepsy, unspecified, not intractable, without status epilepticus: Secondary | ICD-10-CM | POA: Diagnosis not present

## 2021-10-15 DIAGNOSIS — R5383 Other fatigue: Secondary | ICD-10-CM | POA: Diagnosis not present

## 2021-10-15 DIAGNOSIS — R569 Unspecified convulsions: Secondary | ICD-10-CM | POA: Diagnosis not present

## 2021-10-15 DIAGNOSIS — E039 Hypothyroidism, unspecified: Secondary | ICD-10-CM | POA: Diagnosis not present

## 2021-10-17 ENCOUNTER — Encounter: Payer: Self-pay | Admitting: Internal Medicine

## 2021-10-29 ENCOUNTER — Ambulatory Visit (AMBULATORY_SURGERY_CENTER): Payer: BC Managed Care – PPO | Admitting: *Deleted

## 2021-10-29 VITALS — Ht 69.0 in | Wt 180.0 lb

## 2021-10-29 DIAGNOSIS — Z1211 Encounter for screening for malignant neoplasm of colon: Secondary | ICD-10-CM

## 2021-10-29 MED ORDER — PEG 3350-KCL-NA BICARB-NACL 420 G PO SOLR: 4000.0000 mL | Freq: Once | ORAL | 0 refills | Status: AC

## 2021-10-29 NOTE — Progress Notes (Signed)
Patient's pre-visit was done today over the phone with the patient. Name,DOB and address verified. Patient denies any allergies to Eggs and Soy. Patient denies any problems with anesthesia/sedation. Patient is not taking any diet pills or blood thinners. No home Oxygen. Insurance confirmed with patient. Patient states no seizures in the past year. ? ?Prep instructions sent to pt's MyChart -pt is aware. Patient understands to call us back with any questions or concerns. Patient is aware of our care-partner policy.  ? ?EMMI education assigned to the patient for the procedure, sent to [facility].  ? ?The patient is COVID-19 vaccinated.   ?

## 2021-11-06 ENCOUNTER — Encounter: Payer: Self-pay | Admitting: Internal Medicine

## 2021-11-17 ENCOUNTER — Ambulatory Visit (AMBULATORY_SURGERY_CENTER): Payer: BC Managed Care – PPO | Admitting: Internal Medicine

## 2021-11-17 ENCOUNTER — Encounter: Payer: Self-pay | Admitting: Internal Medicine

## 2021-11-17 VITALS — BP 104/55 | HR 58 | Temp 98.0°F | Resp 16 | Ht 69.0 in | Wt 180.0 lb

## 2021-11-17 DIAGNOSIS — D122 Benign neoplasm of ascending colon: Secondary | ICD-10-CM | POA: Diagnosis not present

## 2021-11-17 DIAGNOSIS — Z1211 Encounter for screening for malignant neoplasm of colon: Secondary | ICD-10-CM

## 2021-11-17 DIAGNOSIS — D120 Benign neoplasm of cecum: Secondary | ICD-10-CM | POA: Diagnosis not present

## 2021-11-17 DIAGNOSIS — D123 Benign neoplasm of transverse colon: Secondary | ICD-10-CM | POA: Diagnosis not present

## 2021-11-17 MED ORDER — SODIUM CHLORIDE 0.9 % IV SOLN: 500.0000 mL | Freq: Once | INTRAVENOUS | Status: DC

## 2021-11-17 NOTE — Op Note (Signed)
[facility] ?Patient Name: Rebecca Silva ?Procedure Date: 11/17/2021 2:17 PM ?MRN: [id number] ?Endoscopist: Sonny Masters "Christia Reading ,  ?Age: 51 ?Referring MD:  ?Date of Birth: [birth year] ?Gender: Female ?Account #: [id number] ?Procedure:                Colonoscopy ?Indications:              Screening for colorectal malignant neoplasm, This  ?                          is the patient's first colonoscopy ?Medicines:                Monitored Anesthesia Care ?Procedure:                Pre-Anesthesia Assessment: ?                          - Prior to the procedure, a History and Physical  ?                          was performed, and patient medications and  ?                          allergies were reviewed. The patient's tolerance of  ?                          previous anesthesia was also reviewed. The risks  ?                          and benefits of the procedure and the sedation  ?                          options and risks were discussed with the patient.  ?                          All questions were answered, and informed consent  ?                          was obtained. Prior Anticoagulants: The patient has  ?                          taken no previous anticoagulant or antiplatelet  ?                          agents. ASA Grade Assessment: II - A patient with  ?                          mild systemic disease. After reviewing the risks  ?                          and benefits, the patient was deemed in  ?                          satisfactory condition to undergo the procedure. ?  After obtaining informed consent, the colonoscope  ?                          was passed under direct vision. Throughout the  ?                          procedure, the patient's blood pressure, pulse, and  ?                          oxygen saturations were monitored continuously. The  ?                          CF [id number] #[id number] was introduced through the anus  ?                          and advanced to the  the terminal ileum. The  ?                          colonoscopy was performed without difficulty. The  ?                          patient tolerated the procedure well. The quality  ?                          of the bowel preparation was excellent. The  ?                          terminal ileum, ileocecal valve, appendiceal  ?                          orifice, and rectum were photographed. ?Scope In: 2:21:13 PM ?Scope Out: 2:40:12 PM ?Scope Withdrawal Time: 0 hours 14 minutes 14 seconds  ?Total Procedure Duration: 0 hours 18 minutes 59 seconds  ?Findings:                 The terminal ileum appeared normal. ?                          Four sessile polyps were found in the transverse  ?                          colon, ascending colon and cecum. The polyps were 3  ?                          to 4 mm in size. These polyps were removed with a  ?                          cold snare. Resection and retrieval were complete. ?                          A few diverticula were found in the sigmoid colon. ?                          Non-bleeding internal hemorrhoids were  found during  ?                          retroflexion. ?Complications:            No immediate complications. ?Estimated Blood Loss:     Estimated blood loss was minimal. ?Impression:               - The examined portion of the ileum was normal. ?                          - Four 3 to 4 mm polyps in the transverse colon, in  ?                          the ascending colon and in the cecum, removed with  ?                          a cold snare. Resected and retrieved. ?                          - Diverticulosis in the sigmoid colon. ?                          - Non-bleeding internal hemorrhoids. ?Recommendation:           - Discharge patient to home (with escort). ?                          - Await pathology results. ?Georgian Co,  ?11/17/2021 2:43:40 PM ?

## 2021-11-17 NOTE — Progress Notes (Signed)
Called to room to assist during endoscopic procedure.  Patient ID and intended procedure confirmed with present staff. Received instructions for my participation in the procedure from the performing physician.  

## 2021-11-17 NOTE — Progress Notes (Signed)
Report to pacu rn; vss. ?

## 2021-11-17 NOTE — Progress Notes (Signed)
Pt's states no medical or surgical changes since previsit or office visit. VS assessed by D.T 

## 2021-11-17 NOTE — Patient Instructions (Signed)
Please read handouts provided. Continue present medications. Await pathology results.   YOU HAD AN ENDOSCOPIC PROCEDURE TODAY AT THE [facility]:   Refer to the procedure report that was given to you for any specific questions about what was found during the examination.  If the procedure report does not answer your questions, please call your gastroenterologist to clarify.  If you requested that your care partner not be given the details of your procedure findings, then the procedure report has been included in a sealed envelope for you to review at your convenience later.  YOU SHOULD EXPECT: Some feelings of bloating in the abdomen. Passage of more gas than usual.  Walking can help get rid of the air that was put into your GI tract during the procedure and reduce the bloating. If you had a lower endoscopy (such as a colonoscopy or flexible sigmoidoscopy) you may notice spotting of blood in your stool or on the toilet paper. If you underwent a bowel prep for your procedure, you may not have a normal bowel movement for a few days.  Please Note:  You might notice some irritation and congestion in your nose or some drainage.  This is from the oxygen used during your procedure.  There is no need for concern and it should clear up in a day or so.  SYMPTOMS TO REPORT IMMEDIATELY:  Following lower endoscopy (colonoscopy or flexible sigmoidoscopy):  Excessive amounts of blood in the stool  Significant tenderness or worsening of abdominal pains  Swelling of the abdomen that is new, acute  Fever of 100F or higher   For urgent or emergent issues, a gastroenterologist can be reached at any hour by calling [phone]. Do not use MyChart messaging for urgent concerns.    DIET:  We do recommend a small meal at first, but then you may proceed to your regular diet.  Drink plenty of fluids but you should avoid alcoholic beverages for 24 hours.  ACTIVITY:  You should plan to take it easy  for the rest of today and you should NOT DRIVE or use heavy machinery until tomorrow (because of the sedation medicines used during the test).    FOLLOW UP: Our staff will call the number listed on your records 48-72 hours following your procedure to check on you and address any questions or concerns that you may have regarding the information given to you following your procedure. If we do not reach you, we will leave a message.  We will attempt to reach you two times.  During this call, we will ask if you have developed any symptoms of COVID 19. If you develop any symptoms (ie: fever, flu-like symptoms, shortness of breath, cough etc.) before then, please call [phone].  If you test positive for Covid 19 in the 2 weeks post procedure, please call and report this information to us.    If any biopsies were taken you will be contacted by phone or by letter within the next 1-3 weeks.  Please call us at [phone] if you have not heard about the biopsies in 3 weeks.    SIGNATURES/CONFIDENTIALITY: You and/or your care partner have signed paperwork which will be entered into your electronic medical record.  These signatures attest to the fact that that the information above on your After Visit Summary has been reviewed and is understood.  Full responsibility of the confidentiality of this discharge information lies with you and/or your care-partner.  

## 2021-11-17 NOTE — Progress Notes (Signed)
? ?GASTROENTEROLOGY PROCEDURE H&P NOTE  ? ?Primary Care Physician: ?Hoyt Koch, MD ? ? ? ?Reason for Procedure:   Colon cancer screening ? ?Plan:    Colonoscopy ? ?Patient is appropriate for endoscopic procedure(s) in the ambulatory ([facility]) setting. ? ?The nature of the procedure, as well as the risks, benefits, and alternatives were carefully and thoroughly reviewed with the patient. Ample time for discussion and questions allowed. The patient understood, was satisfied, and agreed to proceed.  ? ? ? ?HPI: ?Rebecca Silva is a 51 y.o. female who presents for colonoscopy for colon cancer screening. Denies blood in stools, changes in bowel habits, weight loss. Denies fam hx of colon cancer. ? ?Past Medical History:  ?Diagnosis Date  ? Asthma   ? Heart murmur   ? History of UTI   ? Hypothyroidism   ? Seasonal allergies   ? Seizure disorder ([place])   ? none in the last year per pt  ? Urine incontinence   ? Wolff-Parkinson-White pattern   ? a. noted on EKG in 07/2016. Echo showed a preserved EF of 55-60% with no regional WMA. ETT showed WPW waveforms with no evidence of HB.   ? ? ?Past Surgical History:  ?Procedure Laterality Date  ? NO PAST SURGERIES    ? ? ?Prior to Admission medications   ?Medication Sig Start Date End Date Taking? Authorizing Provider  ?budesonide-formoterol (SYMBICORT) 80-4.5 MCG/ACT inhaler TAKE 2 PUFFS BY MOUTH FIRST THING IN THE MORNING AND THEN 2 PUFFS ABOUT 12 HOUS LATER. 02/11/21  Yes Tanda Rockers, MD  ?cholecalciferol (VITAMIN D3) 25 MCG (1000 UT) tablet Take 5,000 Units by mouth daily. 2-3 times a week   Yes [provider]  ?Cyanocobalamin (VITAMIN B-12 PO) Vitamin B12   Yes [provider]  ?levETIRAcetam (KEPPRA) 750 MG tablet 1 pill in morning, 1 pill afternoon and 2 pills at bedtime 12/30/20  Yes Pieter Partridge, DO  ?MAGNESIUM PO magnesium   Yes [provider]  ?NP THYROID 90 MG tablet Take 90 mg by mouth at bedtime. 12/13/19  Yes [provider]  ?Zinc Sulfate (ZINC 15 PO) zinc   Yes [provider]  ?albuterol (PROAIR HFA) 108 (90 Base) MCG/ACT inhaler UP  2 puffs every 4 hours as needed ?Patient not taking: Reported on 10/29/2021 02/02/20   Tanda Rockers, MD  ?Multiple Vitamin (MULTIVITAMIN) tablet Take 1 tablet by mouth daily. ?Patient not taking: Reported on 11/17/2021    [provider]  ? ? ?Current Outpatient Medications  ?Medication Sig Dispense Refill  ? budesonide-formoterol (SYMBICORT) 80-4.5 MCG/ACT inhaler TAKE 2 PUFFS BY MOUTH FIRST THING IN THE MORNING AND THEN 2 PUFFS ABOUT 12 HOUS LATER. 10.2 g 11  ? cholecalciferol (VITAMIN D3) 25 MCG (1000 UT) tablet Take 5,000 Units by mouth daily. 2-3 times a week    ? Cyanocobalamin (VITAMIN B-12 PO) Vitamin B12    ? levETIRAcetam (KEPPRA) 750 MG tablet 1 pill in morning, 1 pill afternoon and 2 pills at bedtime 360 tablet 3  ? MAGNESIUM PO magnesium    ? NP THYROID 90 MG tablet Take 90 mg by mouth at bedtime.    ? Zinc Sulfate (ZINC 15 PO) zinc    ? albuterol (PROAIR HFA) 108 (90 Base) MCG/ACT inhaler UP  2 puffs every 4 hours as needed (Patient not taking: Reported on 10/29/2021) 18 g 1  ? Multiple Vitamin (MULTIVITAMIN) tablet Take 1 tablet by mouth daily. (Patient not taking: Reported on  11/17/2021)    ? ?Current Facility-Administered Medications  ?Medication Dose Route Frequency Provider Last Rate Last Admin  ? 0.9 %  sodium chloride infusion  500 mL Intravenous Once Sharyn Creamer, MD      ? ? ?Allergies as of 11/17/2021  ? (No Known Allergies)  ? ? ?Family History  ?Problem Relation Age of Onset  ? Heart disease Mother 22  ?     open heart surgery  ? Heart disease Maternal Grandmother   ? Epilepsy Maternal Grandfather   ? Colon cancer Neg Hx   ? ? ?Social History  ? ?Socioeconomic History  ? Marital status: Married  ?  Spouse name: Not on file  ? Number of children: Not on file  ? Years of education: Not on file  ? Highest education level: Not on file  ?Occupational  History  ? Occupation: Paralegal   ?Tobacco Use  ? Smoking status: Never  ? Smokeless tobacco: Never  ?Vaping Use  ? Vaping Use: Never used  ?Substance and Sexual Activity  ? Alcohol use: No  ?  Alcohol/week: 0.0 standard drinks  ? Drug use: No  ? Sexual activity: Yes  ?  Partners: Male  ?Other Topics Concern  ? Not on file  ?Social History Narrative  ? Paralegal  ? Married: 23 yrs  ? Children: 19 yrs and 15 yrs  ? Regular exercise: walking/running 2 x a wk  ? Caffeine use: coffee daily, sweet tea   ? ?Social Determinants of Health  ? ?Financial Resource Strain: Not on file  ?Food Insecurity: Not on file  ?Transportation Needs: Not on file  ?Physical Activity: Not on file  ?Stress: Not on file  ?Social Connections: Not on file  ?Intimate Partner Violence: Not on file  ? ? ?Physical Exam: ?Vital signs in last 24 hours: ?BP 132/62   Pulse 62   Temp 98 ?F (36.7 ?C) (Skin)   Ht '5\' 9"'$  (1.753 m)   Wt 180 lb (81.6 kg)   SpO2 99%   BMI 26.58 kg/m?  ?GEN: NAD ?EYE: Sclerae anicteric ?ENT: MMM ?CV: Non-tachycardic ?Pulm: No increased work of breathing ?GI: Soft, NT/ND ?NEURO:  Alert & Oriented ? ? ?Christia Reading, MD ?[facility] ? ?11/17/2021 2:11 PM ? ?

## 2021-11-19 ENCOUNTER — Telehealth: Payer: Self-pay

## 2021-11-19 NOTE — Telephone Encounter (Signed)
?  Follow up Call- ? ? ?  11/17/2021  ?  1:55 PM  ?Call back number  ?Post procedure Call Back phone  # [phone]  ?Permission to leave phone message Yes  ?  ? ?Patient questions: ? ?Do you have a fever, pain , or abdominal swelling? No. ?Pain Score  0 * ? ?Have you tolerated food without any problems? Yes.   ? ?Have you been able to return to your normal activities? Yes.   ? ?Do you have any questions about your discharge instructions: ?Diet   No. ?Medications  No. ?Follow up visit  No. ? ?Do you have questions or concerns about your Care? No. ? ?Actions: ?* If pain score is 4 or above: ?No action needed, pain <4. ? ? ?

## 2021-11-20 ENCOUNTER — Encounter: Payer: Self-pay | Admitting: Internal Medicine

## 2021-12-29 NOTE — Progress Notes (Signed)
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  Not appreciated on exam today.    Keppra '750mg'$  in AM and '1500mg'$  at bedtime - refilled Monitor tremor Follow up in one year.   Subjective:  Rebecca Silva is a 51 year old right-handed woman with history of Hashimoto's thyroiditis, asthma and Wolff-Parkinson-White who follows up for primary generalized epilepsy with absence seizures     UPDATE: She takes Keppra '750mg'$  in AM, '750mg'$  at noon and '1500mg'$  at bedtime.  She splits the morning dose of Keppra from '1500mg'$  twice daily to '750mg'$  in AM, '750mg'$  at noon because the full '1500mg'$  in the morning makes her sleepy.  She is doing well.  No recurrent seizures.   Tremors are stable.  Does not bother her.     HISTORY: She has had these episodes of eye flutter at least since adolescence.  Suddenly, both of her eyes will roll back for one or two seconds.  She is not aware that it is happening.  Initially, they occured several times a day, every few minutes.  There is a question about whether she loses awareness during an episode.  If you start a sentence when it occurs, she is not aware of what you said.  If she is speaking, she will stop and say "um" when it occurs.  She denies headache or focal abnormalities.  She denies any abnormal movements or gait abnormalities.  They occur spontaneously and not triggered or relieved by anything particular.  She says that her mom has it but less severe.  Her grandfather had epilepsy.  She had an ambulatory EEG, which showed brief intermittent bursts of generalized slowing with imbedded sharp waves, suggestive of primary generalized epilepsy.  She also had an MRI of the brain performed on 12/13/14, which was personally reviewed and showed no evidence of acute infarct, mass lesion, hemorrhage or mesial temporal  sclerosis.   She reports that for the past few years, her left hand is sometimes shaky when she holds something in her left hand such as a mug or making a fist.  It does not occur at rest.   Does not occur in right hand.  She denies difficulty moving her arms, hands or feet.  No gait abnormality.  No difficulty swallowing.  Sense of smell is okay.  She denies family history of tremors.  PAST MEDICAL HISTORY: Past Medical History:  Diagnosis Date   Asthma    Heart murmur    History of UTI    Hypothyroidism    Seasonal allergies    Seizure disorder ([facility])    none in the last year per pt   Urine incontinence    Wolff-Parkinson-White pattern    a. noted on EKG in 07/2016. Echo showed a preserved EF of 55-60% with no regional WMA. ETT showed WPW waveforms with no evidence of HB.     MEDICATIONS: Current Outpatient Medications on File Prior to Visit  Medication Sig Dispense Refill   albuterol (PROAIR HFA) 108 (90 Base) MCG/ACT inhaler UP  2 puffs every 4 hours as needed (Patient not taking: Reported on 10/29/2021) 18 g 1   budesonide-formoterol (SYMBICORT) 80-4.5 MCG/ACT inhaler TAKE 2 PUFFS BY MOUTH FIRST THING IN THE MORNING AND THEN 2 PUFFS ABOUT 12 HOUS LATER. 10.2 g 11   cholecalciferol (VITAMIN D3) 25 MCG (1000 UT) tablet Take 5,000 Units by mouth  daily. 2-3 times a week     Cyanocobalamin (VITAMIN B-12 PO) Vitamin B12     levETIRAcetam (KEPPRA) 750 MG tablet 1 pill in morning, 1 pill afternoon and 2 pills at bedtime 360 tablet 3   MAGNESIUM PO magnesium     Multiple Vitamin (MULTIVITAMIN) tablet Take 1 tablet by mouth daily. (Patient not taking: Reported on 11/17/2021)     NP THYROID 90 MG tablet Take 90 mg by mouth at bedtime.     Zinc Sulfate (ZINC 15 PO) zinc     No current facility-administered medications on file prior to visit.    ALLERGIES: No Known Allergies  FAMILY HISTORY: Family History  Problem Relation Age of Onset   Heart disease Mother 41       open heart surgery    Heart disease Maternal Grandmother    Epilepsy Maternal Grandfather    Colon cancer Neg Hx       Objective:  Blood pressure 127/77, pulse 69, height '5\' 8"'$  (1.727 m), weight 184 lb 6.4 oz (83.6 kg), SpO2 98 %. General: No acute distress.  Patient appears well-groomed.   Head:  Normocephalic/atraumatic Eyes:  Fundi examined but not visualized Neck: supple, no paraspinal tenderness, full range of motion Heart:  Regular rate and rhythm Neurological Exam: alert and oriented to person, place, and time.  Speech fluent and not dysarthric, language intact.  CN II-XII intact. Bulk and tone normal, muscle strength 5/5 throughout.  Sensation to light touch intact.  Deep tendon reflexes 2+ throughout.  Finger to nose testing intact.  Gait normal, Romberg negative.   Metta Clines, DO  CC: Pricilla Holm, MD

## 2021-12-30 ENCOUNTER — Encounter: Payer: Self-pay | Admitting: Neurology

## 2021-12-30 ENCOUNTER — Ambulatory Visit (INDEPENDENT_AMBULATORY_CARE_PROVIDER_SITE_OTHER): Payer: BC Managed Care – PPO | Admitting: Neurology

## 2021-12-30 VITALS — BP 127/77 | HR 69 | Ht 68.0 in | Wt 184.4 lb

## 2021-12-30 DIAGNOSIS — G40309 Generalized idiopathic epilepsy and epileptic syndromes, not intractable, without status epilepticus: Secondary | ICD-10-CM

## 2021-12-30 DIAGNOSIS — R251 Tremor, unspecified: Secondary | ICD-10-CM

## 2021-12-30 MED ORDER — LEVETIRACETAM 750 MG PO TABS: ORAL_TABLET | ORAL | 3 refills | Status: DC

## 2022-02-10 ENCOUNTER — Ambulatory Visit (INDEPENDENT_AMBULATORY_CARE_PROVIDER_SITE_OTHER): Payer: BC Managed Care – PPO | Admitting: Internal Medicine

## 2022-02-10 ENCOUNTER — Encounter: Payer: Self-pay | Admitting: Internal Medicine

## 2022-02-10 DIAGNOSIS — J4540 Moderate persistent asthma, uncomplicated: Secondary | ICD-10-CM

## 2022-02-10 MED ORDER — BUDESONIDE-FORMOTEROL FUMARATE 80-4.5 MCG/ACT IN AERO: INHALATION_SPRAY | RESPIRATORY_TRACT | 11 refills | Status: DC

## 2022-02-10 MED ORDER — ALBUTEROL SULFATE HFA 108 (90 BASE) MCG/ACT IN AERS: INHALATION_SPRAY | RESPIRATORY_TRACT | 1 refills | Status: DC

## 2022-02-10 NOTE — Patient Instructions (Addendum)
Symbicort 80 is  0 -2 puffs every 12 hours  - remember only taper down or off after a full week of no symptoms or need for albuterol   Please schedule a follow up visit in 12 months but call sooner if needed

## 2022-02-10 NOTE — Assessment & Plan Note (Signed)
Onset Dec 2014 - 07/24/2014   > trial of qvar 80 2bid - PFTs 11/01/2014 FEV1  2.36 (69%) ratio 74 p 33% improvement from saba/ dlco 80% -  11/01/2014 p extensive coaching HFA effectiveness =    75% > try symbicort 160 2bid > marked clinical  improvement 02/08/15  -  06/10/2016    try symb 80 2bid > all symptoms resolved  - 02/02/2020  After extensive coaching inhaler device,  effectiveness =    85% > continue symbicort 80  1-2 each am  And pm doses optional  - 02/10/2022  After extensive coaching inhaler device,  effectiveness =   90% so rec continue symb 80 up to 2 bid slow taper as tol   All goals of chronic asthma control met including optimal function and elimination of symptoms with minimal need for rescue therapy.  Contingencies discussed in full including contacting this office immediately if not controlling the symptoms using the rule of two's.     Ok to use symb 80 prn here Based on two studies from NEJM  378; 20 p 1865 (2018) and 380 : p2020-30 (2019) in pts with mild asthma it is reasonable to use low dose symbicort eg 80 2bid "prn" flare in this setting but I emphasized this was only shown with symbicort and takes advantage of the rapid onset of action but is not the same as "rescue therapy" but can be stopped once the acute symptoms have resolved and the need for rescue has been minimized (< 2 x weekly)    F/u yearly, call sooner if needed         Each maintenance medication was reviewed in detail including emphasizing most importantly the difference between maintenance and prns and under what circumstances the prns are to be triggered using an action plan format where appropriate.  Total time for H and P, chart review, counseling, reviewing hfa device(s) and generating customized AVS unique to this office visit / same day charting = 23 min

## 2022-02-10 NOTE — Progress Notes (Signed)
Subjective:    Patient ID: Rebecca Silva, female    DOB: 1970-11-28   MRN: [id number]    Brief patient profile:  75 yowf never smoker with tendency to bronchitis all her life sev times a year never required prednisone or breathing treatments until mid 24s but started then on prn saba  But never needed maint inhaler or more than twice weekly saba but since Dec 2014 needed it more often as much as twice daily then admitted:   Admit date: 06/06/2014 Discharge date: 06/08/2014   Discharge Diagnoses:   Principal Problem:   Asthma exacerbation   Hypothyroidism   Leukocytosis   Viral URI with cough   Tachycardia    History of Present Illness  06/20/2014 1st [facility] office visit/ Rebecca Silva  / referred by [facility] after above admit  Chief Complaint  Patient presents with   Pulmonary Consult    Self referral. Pt states dxed with Asthma approx 10 yrs ago. She c/o SOB and cough since mid Nov 2015.  Her cough is prod with minimal yellow sputum.  She is using rescue inhaler on average 2 x per day.     much better since admit but note over use of saba at baseline and still doe x exertion but not at rest/ sleeping or adls rec Plan A = automatic = qvar 80 Take 2 puffs first thing in am and then another 2 puffs about 12 hours later (other option is dulera but probably don't need it)  Plan B = backup saba hfa Plan C= Crisis = nebulizer if plan A and B don't work  Plan D = Doctor, call me if not satisfied with the above       11/01/2014 f/u ov/Rebecca Silva re: moderate persistent asthma  qvar 80 2 bid / more sob than cough  Chief Complaint  Patient presents with   Follow-up    PFT done today. Pt states that her breathing is unchanged since her last visit. She uses rescue inhaler 1-2 x per wk. She c/o Qvar sometimes making her feel that she is about to have an asthma attack.   marked decrease in need for saba since starting qvar / no noct flares  rec Change qvar to symbicort 160 Take 2 puffs  first thing in am and then another 2 puffs about 12 hours later.  Only use your albuterol as a rescue medication     02/08/2015 f/u ov/Rebecca Silva re: chronic asthma  Chief Complaint  Patient presents with   Follow-up    Pt states breathing is doing well. She uses albuterol about once per month.   Not limited by breathing from desired activities   If the drugstore doesn't honor your zero coupon please let me know  Continue symbicort 160 Take 2 puffs first thing in am and then another 2 puffs about 12 hours later.  Only use your albuterol as a rescue medication     12/05/2018  f/u ov/Rebecca Silva re: chronic asthma/ weaned herself down to symb 80 1 bid  Chief Complaint  Patient presents with   Follow-up    Breathing is doing well. She rarely uses her rescue inhaler or neb.   Dyspnea:  Still doing some running  In all conditioning Cough: no Sleeping: able to lie flat wakes up with bitemple ha  SABA use: rarely 02: none  rec Plan A = Automatic =  Symbicort 80 up to 2 puffs every 12 hours as needed  Plan B = Backup Only use  your albuterol inhaler as a rescue medication     02/02/2020  f/u ov/Rebecca Silva re: asthma /  2nd pfizer October 24 2019 / maint symb 80 2 q am  Chief Complaint  Patient presents with   Follow-up  Dyspnea:  Still  running 58mles in 45 hours  Cough: no  Sleeping: bed is flat / one pillow , no HA  SABA use: none   02: none  Rec Plan A = Automatic = Always=    symbicort 80 1-2 puffs each am then 1-2 puff 12 hours later are optional Plan B = Backup (to supplement plan A, not to replace it) Only use your albuterol inhaler as a rescue medication   02/11/2021  f/u ov/Rebecca Silva re:  asthma 1 y f/u on symbicort 80 2 puffs q am  Chief Complaint  Patient presents with   Follow-up    Reports 1 yr f/u. No complaints voiced   Dyspnea:  occ runs  Cough: none  Sleeping: flat / one pillow SABA use: not  needing any  02: none  Covid status:   had covid May 2022 p vax x 3  Rec No change rx       02/10/2022  f/u ov/Rebecca Silva re: asthma/yearly f/u  maint on symbicort 80 one bid   Chief Complaint  Patient presents with   Follow-up    Pt states she is about the same from last OV.  Dyspnea:  not doing as much running/ knee issues  - Not limited by breathing from desired activities   Cough: none  Sleeping: none flat one pillow  SABA use: none  02: none  Covid status:   vax x 2/ once infected    No obvious day to day or daytime variability or assoc excess/ purulent sputum or mucus plugs or hemoptysis or cp or chest tightness, subjective wheeze or overt sinus or hb symptoms.   Sleeping  without nocturnal  or early am exacerbation  of respiratory  c/o's or need for noct saba. Also denies any obvious fluctuation of symptoms with weather or environmental changes or other aggravating or alleviating factors except as outlined above   No unusual exposure hx or h/o childhood pna/ asthma or knowledge of premature birth.  Current Allergies, Complete Past Medical History, Past Surgical History, Family History, and Social History were reviewed in [employer].  ROS  The following are not active complaints unless bolded Hoarseness, sore throat, dysphagia, dental problems, itching, sneezing,  nasal congestion or discharge of excess mucus or purulent secretions, ear ache,   fever, chills, sweats, unintended wt loss or wt gain, classically pleuritic or exertional cp,  orthopnea pnd or arm/hand swelling  or leg swelling, presyncope, palpitations, abdominal pain, anorexia, nausea, vomiting, diarrhea  or change in bowel habits or change in bladder habits, change in stools or change in urine, dysuria, hematuria,  rash, arthralgias, visual complaints, headache, numbness, weakness or ataxia or problems with walking or coordination,  change in mood or  memory.        Current Meds  Medication Sig   albuterol (PROAIR HFA) 108 (90 Base) MCG/ACT inhaler UP  2 puffs every 4 hours as  needed   budesonide-formoterol (SYMBICORT) 80-4.5 MCG/ACT inhaler TAKE 2 PUFFS BY MOUTH FIRST THING IN THE MORNING AND THEN 2 PUFFS ABOUT 12 HOUS LATER.   cholecalciferol (VITAMIN D3) 25 MCG (1000 UT) tablet Take 5,000 Units by mouth daily. 2-3 times a week   Cyanocobalamin (VITAMIN B-12 PO) Vitamin B12  levETIRAcetam (KEPPRA) 750 MG tablet 1 pill in morning, 1 pill afternoon and 2 pills at bedtime   MAGNESIUM PO magnesium   Multiple Vitamin (MULTIVITAMIN) tablet Take 1 tablet by mouth daily.   NP THYROID 90 MG tablet Take 90 mg by mouth at bedtime.   Zinc Sulfate (ZINC 15 PO) zinc                          Objective:   Physical Exam  Wts  02/10/2022   184  02/11/2021    197   02/02/2020  195  12/05/2018   192  10/20/2017     188   02/08/2015   198   11/01/2014  199 07/24/2014     200       06/20/14 199 lb (90.266 kg)  06/06/14 196 lb (88.905 kg)  06/06/14 196 lb (88.905 kg)      Vital signs reviewed  02/10/2022  - Note at rest 02 sats  97% on RA   General appearance:    amb wf nad   HEENT : Oropharynx  clear     Nasal turbinates nl    NECK :  without  apparent JVD/ palpable Nodes/TM    LUNGS: no acc muscle use,  Nl contour chest which is clear to A and P bilaterally without cough on insp or exp maneuvers   CV:  RRR  no s3 or murmur or increase in P2, and no edema   ABD:  soft and nontender   MS:  Nl gait/ ext warm without deformities Or obvious joint restrictions  calf tenderness, cyanosis or clubbing    SKIN: warm and dry without lesions    NEURO:  alert, approp, nl sensorium with  no motor or cerebellar deficits apparent.            Assessment & Plan:

## 2022-02-12 ENCOUNTER — Ambulatory Visit: Payer: BC Managed Care – PPO | Admitting: Internal Medicine

## 2022-02-27 ENCOUNTER — Encounter: Payer: Self-pay | Admitting: Neurology

## 2022-04-09 DIAGNOSIS — L820 Inflamed seborrheic keratosis: Secondary | ICD-10-CM | POA: Diagnosis not present

## 2022-04-09 DIAGNOSIS — D225 Melanocytic nevi of trunk: Secondary | ICD-10-CM | POA: Diagnosis not present

## 2022-04-09 DIAGNOSIS — D485 Neoplasm of uncertain behavior of skin: Secondary | ICD-10-CM | POA: Diagnosis not present

## 2022-04-09 DIAGNOSIS — Z1283 Encounter for screening for malignant neoplasm of skin: Secondary | ICD-10-CM | POA: Diagnosis not present

## 2022-04-20 DIAGNOSIS — L820 Inflamed seborrheic keratosis: Secondary | ICD-10-CM | POA: Diagnosis not present

## 2022-04-20 DIAGNOSIS — D485 Neoplasm of uncertain behavior of skin: Secondary | ICD-10-CM | POA: Diagnosis not present

## 2022-04-20 DIAGNOSIS — L988 Other specified disorders of the skin and subcutaneous tissue: Secondary | ICD-10-CM | POA: Diagnosis not present

## 2022-05-18 DIAGNOSIS — E559 Vitamin D deficiency, unspecified: Secondary | ICD-10-CM | POA: Diagnosis not present

## 2022-05-18 DIAGNOSIS — G40909 Epilepsy, unspecified, not intractable, without status epilepticus: Secondary | ICD-10-CM | POA: Diagnosis not present

## 2022-05-18 DIAGNOSIS — R569 Unspecified convulsions: Secondary | ICD-10-CM | POA: Diagnosis not present

## 2022-05-18 DIAGNOSIS — E063 Autoimmune thyroiditis: Secondary | ICD-10-CM | POA: Diagnosis not present

## 2022-05-18 DIAGNOSIS — E039 Hypothyroidism, unspecified: Secondary | ICD-10-CM | POA: Diagnosis not present

## 2022-05-18 DIAGNOSIS — E538 Deficiency of other specified B group vitamins: Secondary | ICD-10-CM | POA: Diagnosis not present

## 2022-05-18 DIAGNOSIS — Z13220 Encounter for screening for lipoid disorders: Secondary | ICD-10-CM | POA: Diagnosis not present

## 2022-05-18 DIAGNOSIS — R5383 Other fatigue: Secondary | ICD-10-CM | POA: Diagnosis not present

## 2022-05-22 DIAGNOSIS — E063 Autoimmune thyroiditis: Secondary | ICD-10-CM | POA: Diagnosis not present

## 2022-05-22 DIAGNOSIS — E039 Hypothyroidism, unspecified: Secondary | ICD-10-CM | POA: Diagnosis not present

## 2022-08-07 DIAGNOSIS — E639 Nutritional deficiency, unspecified: Secondary | ICD-10-CM | POA: Diagnosis not present

## 2022-08-07 DIAGNOSIS — E559 Vitamin D deficiency, unspecified: Secondary | ICD-10-CM | POA: Diagnosis not present

## 2022-08-07 DIAGNOSIS — E063 Autoimmune thyroiditis: Secondary | ICD-10-CM | POA: Diagnosis not present

## 2022-08-07 DIAGNOSIS — N926 Irregular menstruation, unspecified: Secondary | ICD-10-CM | POA: Diagnosis not present

## 2022-08-07 DIAGNOSIS — R5383 Other fatigue: Secondary | ICD-10-CM | POA: Diagnosis not present

## 2022-08-07 DIAGNOSIS — G40909 Epilepsy, unspecified, not intractable, without status epilepticus: Secondary | ICD-10-CM | POA: Diagnosis not present

## 2022-09-04 DIAGNOSIS — Z1283 Encounter for screening for malignant neoplasm of skin: Secondary | ICD-10-CM | POA: Diagnosis not present

## 2022-09-04 DIAGNOSIS — D225 Melanocytic nevi of trunk: Secondary | ICD-10-CM | POA: Diagnosis not present

## 2022-09-04 DIAGNOSIS — D2272 Melanocytic nevi of left lower limb, including hip: Secondary | ICD-10-CM | POA: Diagnosis not present

## 2022-09-04 DIAGNOSIS — D485 Neoplasm of uncertain behavior of skin: Secondary | ICD-10-CM | POA: Diagnosis not present

## 2022-10-28 DIAGNOSIS — Z124 Encounter for screening for malignant neoplasm of cervix: Secondary | ICD-10-CM | POA: Diagnosis not present

## 2022-10-28 DIAGNOSIS — Z1231 Encounter for screening mammogram for malignant neoplasm of breast: Secondary | ICD-10-CM | POA: Diagnosis not present

## 2022-10-28 DIAGNOSIS — Z1151 Encounter for screening for human papillomavirus (HPV): Secondary | ICD-10-CM | POA: Diagnosis not present

## 2022-10-28 DIAGNOSIS — Z01419 Encounter for gynecological examination (general) (routine) without abnormal findings: Secondary | ICD-10-CM | POA: Diagnosis not present

## 2022-10-28 DIAGNOSIS — Z130 Encounter for screening for diseases of the blood and blood-forming organs and certain disorders involving the immune mechanism: Secondary | ICD-10-CM | POA: Diagnosis not present

## 2022-10-28 DIAGNOSIS — Z780 Asymptomatic menopausal state: Secondary | ICD-10-CM | POA: Diagnosis not present

## 2022-12-22 DIAGNOSIS — E559 Vitamin D deficiency, unspecified: Secondary | ICD-10-CM | POA: Diagnosis not present

## 2022-12-22 DIAGNOSIS — E639 Nutritional deficiency, unspecified: Secondary | ICD-10-CM | POA: Diagnosis not present

## 2022-12-22 DIAGNOSIS — R5383 Other fatigue: Secondary | ICD-10-CM | POA: Diagnosis not present

## 2022-12-22 DIAGNOSIS — E538 Deficiency of other specified B group vitamins: Secondary | ICD-10-CM | POA: Diagnosis not present

## 2022-12-22 DIAGNOSIS — N926 Irregular menstruation, unspecified: Secondary | ICD-10-CM | POA: Diagnosis not present

## 2022-12-22 DIAGNOSIS — E039 Hypothyroidism, unspecified: Secondary | ICD-10-CM | POA: Diagnosis not present

## 2022-12-22 DIAGNOSIS — E063 Autoimmune thyroiditis: Secondary | ICD-10-CM | POA: Diagnosis not present

## 2022-12-28 DIAGNOSIS — R5383 Other fatigue: Secondary | ICD-10-CM | POA: Diagnosis not present

## 2022-12-28 DIAGNOSIS — E039 Hypothyroidism, unspecified: Secondary | ICD-10-CM | POA: Diagnosis not present

## 2022-12-28 DIAGNOSIS — E063 Autoimmune thyroiditis: Secondary | ICD-10-CM | POA: Diagnosis not present

## 2022-12-28 DIAGNOSIS — G40909 Epilepsy, unspecified, not intractable, without status epilepticus: Secondary | ICD-10-CM | POA: Diagnosis not present

## 2022-12-30 NOTE — Progress Notes (Signed)
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    Subjective:  Rebecca Silva is a 52 year old right-handed woman with history of Hashimoto's thyroiditis, asthma and Wolff-Parkinson-White who follows up for primary generalized epilepsy with absence seizures and essential tremor.     UPDATE: She takes Keppra 750mg  in AM, 750mg  at noon and 1500mg  at bedtime.  She splits the morning dose of Keppra from 1500mg  twice daily to 750mg  in AM, 750mg  at noon because the full 1500mg  in the morning makes her sleepy.  She is doing well.  No recurrent seizures.   Tremor in left hand stable.  In the last week, she noticed some tremor in her right thumb.  She has had changes in her thyroid medication.   HISTORY: She has had these episodes of eye flutter at least since adolescence.  Suddenly, both of her eyes will roll back for one or two seconds.  She is not aware that it is happening.  Initially, they occured several times a day, every few minutes.  There is a question about whether she loses awareness during an episode.  If you start a sentence when it occurs, she is not aware of what you said.  If she is speaking, she will stop and say "um" when it occurs.  She denies headache or focal abnormalities.  She denies any abnormal movements or gait abnormalities.  They occur spontaneously and not triggered or relieved by anything particular.  She says that her mom has it but less severe.  Her grandfather had epilepsy.  She had an ambulatory EEG, which showed brief intermittent bursts of generalized slowing with imbedded sharp waves, suggestive of primary generalized epilepsy.  She also had an MRI of the brain performed on 12/13/14, which was  personally reviewed and showed no evidence of acute infarct, mass lesion, hemorrhage or mesial temporal sclerosis.   She reports that for the past few years, her left hand is sometimes shaky when she holds something in her left hand such as a mug or making a fist.  It does not occur at rest.   Does not occur in right hand.  She denies difficulty moving her arms, hands or feet.  No gait abnormality.  No difficulty swallowing.  Sense of smell is okay.  She denies family history of tremors.  PAST MEDICAL HISTORY: Past Medical History:  Diagnosis Date   Asthma    Heart murmur    History of UTI    Hypothyroidism    Seasonal allergies    Seizure disorder ([facility])    none in the last year per pt   Urine incontinence    Wolff-Parkinson-White pattern    a. noted on EKG in 07/2016. Echo showed a preserved EF of 55-60% with no regional WMA. ETT showed WPW waveforms with no evidence of HB.     MEDICATIONS: Current Outpatient Medications on File Prior to Visit  Medication Sig Dispense Refill   albuterol (PROAIR HFA) 108 (90 Base) MCG/ACT inhaler UP  2 puffs every 4 hours as needed (Patient not taking: Reported on 10/29/2021) 18 g 1   budesonide-formoterol (SYMBICORT) 80-4.5 MCG/ACT inhaler TAKE 2 PUFFS BY MOUTH FIRST THING IN THE MORNING AND THEN 2 PUFFS ABOUT 12 HOUS  LATER. 10.2 g 11   cholecalciferol (VITAMIN D3) 25 MCG (1000 UT) tablet Take 5,000 Units by mouth daily. 2-3 times a week     Cyanocobalamin (VITAMIN B-12 PO) Vitamin B12     levETIRAcetam (KEPPRA) 750 MG tablet 1 pill in morning, 1 pill afternoon and 2 pills at bedtime 360 tablet 3   MAGNESIUM PO magnesium     Multiple Vitamin (MULTIVITAMIN) tablet Take 1 tablet by mouth daily. (Patient not taking: Reported on 11/17/2021)     NP THYROID 90 MG tablet Take 90 mg by mouth at bedtime.     Zinc Sulfate (ZINC 15 PO) zinc     No current facility-administered medications on file prior to visit.    ALLERGIES: No Known Allergies  FAMILY  HISTORY: Family History  Problem Relation Age of Onset   Heart disease Mother 52       open heart surgery   Heart disease Maternal Grandmother    Epilepsy Maternal Grandfather    Colon cancer Neg Hx       Objective:  Blood pressure 110/60, pulse 98, height 5\' 10"  (1.778 m), weight 193 lb (87.5 kg), SpO2 98 %. General: No acute distress.  Patient appears well-groomed.   Head:  Normocephalic/atraumatic Eyes:  Fundi examined but not visualized Neck: supple, no paraspinal tenderness, full range of motion Heart:  Regular rate and rhythm Neurological Exam: alert and oriented.  Speech fluent and not dysarthric, language intact.  CN II-XII intact. Bulk and tone normal, muscle strength 5/5 throughout.  Very fine tremor in right thumb and left hand with hands outstretched.  No resting tremor.  No rigidity.  Sensation to light touch intact.  Deep tendon reflexes 2+ throughout.  Finger to nose testing intact.  Gait normal, Romberg negative.   Shon Millet, DO  CC: Hillard Danker, MD

## 2023-01-01 ENCOUNTER — Ambulatory Visit: Payer: BC Managed Care – PPO | Admitting: Neurology

## 2023-01-04 ENCOUNTER — Encounter: Payer: Self-pay | Admitting: Neurology

## 2023-01-04 ENCOUNTER — Ambulatory Visit (INDEPENDENT_AMBULATORY_CARE_PROVIDER_SITE_OTHER): Payer: BC Managed Care – PPO | Admitting: Neurology

## 2023-01-04 VITALS — BP 110/60 | HR 98 | Ht 70.0 in | Wt 193.0 lb

## 2023-01-04 DIAGNOSIS — G40309 Generalized idiopathic epilepsy and epileptic syndromes, not intractable, without status epilepticus: Secondary | ICD-10-CM

## 2023-01-04 DIAGNOSIS — R251 Tremor, unspecified: Secondary | ICD-10-CM

## 2023-01-04 MED ORDER — LEVETIRACETAM 750 MG PO TABS
ORAL_TABLET | ORAL | 3 refills | Status: AC
Start: 2023-01-04 — End: ?

## 2023-02-19 ENCOUNTER — Other Ambulatory Visit: Payer: Self-pay | Admitting: Internal Medicine

## 2023-02-19 DIAGNOSIS — J4540 Moderate persistent asthma, uncomplicated: Secondary | ICD-10-CM

## 2023-03-11 ENCOUNTER — Other Ambulatory Visit: Payer: Self-pay | Admitting: Medical Genetics

## 2023-03-11 DIAGNOSIS — Z006 Encounter for examination for normal comparison and control in clinical research program: Secondary | ICD-10-CM

## 2023-03-15 ENCOUNTER — Encounter: Payer: Self-pay | Admitting: Neurology

## 2023-03-18 ENCOUNTER — Ambulatory Visit (INDEPENDENT_AMBULATORY_CARE_PROVIDER_SITE_OTHER): Payer: BC Managed Care – PPO | Admitting: Neurology

## 2023-03-18 ENCOUNTER — Encounter: Payer: Self-pay | Admitting: Neurology

## 2023-03-18 VITALS — BP 109/57 | HR 67 | Ht 69.0 in | Wt 197.2 lb

## 2023-03-18 DIAGNOSIS — G250 Essential tremor: Secondary | ICD-10-CM

## 2023-03-18 DIAGNOSIS — G40309 Generalized idiopathic epilepsy and epileptic syndromes, not intractable, without status epilepticus: Secondary | ICD-10-CM | POA: Diagnosis not present

## 2023-03-18 NOTE — Patient Instructions (Signed)
Essential Tremor A tremor is trembling or shaking that a person cannot control. Most tremors affect the hands or arms. Tremors can also affect the head, vocal cords, legs, and other parts of the body. Essential tremor is a tremor without a known cause. Usually, it occurs while a person is trying to perform an action. It tends to get worse gradually as a person ages. What are the causes? The cause of this condition is not known, but it often runs in families. What increases the risk? You are more likely to develop this condition if: You have a family member with essential tremor. You are 40 years of age or older. What are the signs or symptoms? The main sign of a tremor is a rhythmic shaking of certain parts of your body that is uncontrolled and unintentional. You may: Have difficulty eating with a spoon or fork. Have difficulty writing. Nod your head up and down or side to side. Have a quivering voice. The shaking may: Get worse over time. Come and go. Be more noticeable on one side of your body. Get worse due to stress, tiredness (fatigue), caffeine, and extreme heat or cold. How is this diagnosed? This condition may be diagnosed based on: Your symptoms and medical history. A physical exam. There is no single test to diagnose an essential tremor. However, your health care provider may order tests to rule out other causes of your condition. These may include: Blood and urine tests. Imaging studies of your brain, such as a CT scan or MRI. How is this treated? Treatment for essential tremor depends on the severity of the condition. Mild tremors may not need treatment if they do not affect your day-to-day life. Severe tremors may need to be treated using one or more of the following options: Medicines. Injections of a substance called botulinum toxin. Procedures such as deep brain stimulation (DBS) implantation or MRI-guided ultrasound treatment. Lifestyle changes. Occupational or  physical therapy. Follow these instructions at home: Lifestyle  Do not use any products that contain nicotine or tobacco. These products include cigarettes, chewing tobacco, and vaping devices, such as e-cigarettes. If you need help quitting, ask your health care provider. Limit your caffeine intake as told by your health care provider. Try to get 8 hours of sleep each night. Find ways to manage your stress that fit your lifestyle and personality. Consider trying meditation or yoga. Try to anticipate stressful situations and allow extra time to manage them. If you are struggling emotionally with the effects of your tremor, consider working with a mental health provider. General instructions Take over-the-counter and prescription medicines only as told by your health care provider. Avoid extreme heat and extreme cold. Keep all follow-up visits. This is important. Visits may include physical therapy visits. Where to find more information National Institute of Neurological Disorders and Stroke: www.ninds.nih.gov Contact a health care provider if: You experience any changes in the location or intensity of your tremors. You start having a tremor after starting a new medicine. You have a tremor with other symptoms, such as: Numbness. Tingling. Pain. Weakness. Your tremor gets worse. Your tremor interferes with your daily life. You feel down, blue, or sad for at least 2 weeks in a row. Worrying about your tremor and what other people think about you interferes with your everyday life functions, including relationships, work, or school. Summary Essential tremor is a tremor without a known cause. Usually, it occurs when you are trying to perform an action. You are more likely   to develop this condition if you have a family member with essential tremor. The main sign of a tremor is a rhythmic shaking of certain parts of your body that is uncontrolled and unintentional. Treatment for essential  tremor depends on the severity of the condition. This information is not intended to replace advice given to you by your health care provider. Make sure you discuss any questions you have with your health care provider. Document Revised: 04/25/2021 Document Reviewed: 04/25/2021 Elsevier Patient Education  2024 [employer]  

## 2023-03-18 NOTE — Progress Notes (Signed)
NEUROLOGY FOLLOW UP OFFICE NOTE  Rebecca Silva [id number]  Assessment/Plan:   Essential tremor. 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  Will continue to monitor Keppra 750mg  in AM, 750mg  in afternoon and 1500mg  at bedtime Follow up in June 2025 as scheduled or sooner if needed.   Subjective:  Rebecca Silva is a 52 year old right-handed woman with history of Hashimoto's thyroiditis, asthma and Wolff-Parkinson-White who follows up for primary generalized epilepsy with absence seizures and worsening tremor.     UPDATE: She takes Keppra 750mg  in AM, 750mg  at noon and 1500mg  at bedtime.  She splits the morning dose of Keppra from 1500mg  twice daily to 750mg  in AM, 750mg  at noon because the full 1500mg  in the morning makes her sleepy.   No recurrent seizures.   She reports worsening tremor. She notices it more in the right hand now.  It occurred while she was writing. Not at rest.   HISTORY: She has had these episodes of eye flutter at least since adolescence.  Suddenly, both of her eyes will roll back for one or two seconds.  She is not aware that it is happening.  Initially, they occured several times a day, every few minutes.  There is a question about whether she loses awareness during an episode.  If you start a sentence when it occurs, she is not aware of what you said.  If she is speaking, she will stop and say "um" when it occurs.  She denies headache or focal abnormalities.  She denies any abnormal movements or gait abnormalities.  They occur spontaneously and not triggered or relieved by anything particular.  She says that her mom has it but less severe.  Her grandfather had epilepsy.  She had an ambulatory EEG, which showed brief intermittent bursts of generalized slowing with imbedded sharp waves, suggestive of primary generalized epilepsy.   She also had an MRI of the brain performed on 12/13/14, which was personally reviewed and showed no evidence of acute infarct, mass lesion, hemorrhage or mesial temporal sclerosis.   She reports that for the past few years, her left hand is sometimes shaky when she holds something in her left hand such as a mug or making a fist.  It does not occur at rest.   Does not occur in right hand.  She denies difficulty moving her arms, hands or feet.  No gait abnormality.  No difficulty swallowing.  Sense of smell is okay.  She denies family history of tremors.  PAST MEDICAL HISTORY: Past Medical History:  Diagnosis Date   Asthma    Heart murmur    History of UTI    Hypothyroidism    Seasonal allergies    Seizure disorder (HCC)    none in the last year per pt   Urine incontinence    Wolff-Parkinson-White pattern    a. noted on EKG in 07/2016. Echo showed a preserved EF of 55-60% with no regional WMA. ETT showed WPW waveforms with no evidence of HB.     MEDICATIONS: Current Outpatient Medications on File Prior to Visit  Medication Sig Dispense Refill   albuterol (PROAIR HFA) 108 (90 Base) MCG/ACT inhaler UP  2 puffs every 4 hours as needed 18 g 1   B Complex-Biotin-FA (B-COMPLEX PO) Take by mouth.     budesonide-formoterol (SYMBICORT) 80-4.5 MCG/ACT inhaler Inhale 2  puffs into the lungs in the morning and at bedtime. **NEEDS APPT FOR FURTHER REFILLS** 10.2 g 1   cholecalciferol (VITAMIN D3) 25 MCG (1000 UT) tablet Take 5,000 Units by mouth daily. 2-3 times a week     estradiol (VIVELLE-DOT) 0.025 MG/24HR Place 1 patch onto the skin 2 (two) times a week.     levETIRAcetam (KEPPRA) 750 MG tablet 1 pill in morning, 1 pill afternoon and 2 pills at bedtime 360 tablet 3   MAGNESIUM PO magnesium     Multiple Vitamin (MULTIVITAMIN) tablet Take 1 tablet by mouth daily.     NP THYROID 120 MG tablet Take 120 mg by mouth every morning.     progesterone (PROMETRIUM) 100 MG capsule Take 200 mg by mouth at  bedtime.     Zinc Sulfate (ZINC 15 PO) zinc     No current facility-administered medications on file prior to visit.    ALLERGIES: No Known Allergies  FAMILY HISTORY: Family History  Problem Relation Age of Onset   Heart disease Mother 79       open heart surgery   Heart disease Maternal Grandmother    Epilepsy Maternal Grandfather    Colon cancer Neg Hx       Objective:  Blood pressure (!) 109/57, pulse 67, height 5\' 9"  (1.753 m), weight 197 lb 3.2 oz (89.4 kg), SpO2 97%. General: No acute distress.  Patient appears well-groomed.   Head:  Normocephalic/atraumatic Eyes:  Fundi examined but not visualized Neck: supple, no paraspinal tenderness, full range of motion Heart:  Regular rate and rhythm Neurological Exam: alert and oriented.  Speech fluent and not dysarthric, language intact.  CN II-XII intact. Bulk and tone normal, muscle strength 5/5 throughout.  No bradykinesia.  Fine postural and intention tremor in both hands.  Sensation to light touch intact.  Deep tendon reflexes 2+ throughout.  Finger to nose testing intact.  Gait normal, Romberg negative.   Shon Millet, DO  CC: Hillard Danker, MD

## 2023-04-05 ENCOUNTER — Ambulatory Visit: Payer: BC Managed Care – PPO | Admitting: Neurology

## 2023-05-06 ENCOUNTER — Other Ambulatory Visit (HOSPITAL_COMMUNITY): Payer: Self-pay | Attending: Medical Genetics

## 2023-05-12 DIAGNOSIS — E063 Autoimmune thyroiditis: Secondary | ICD-10-CM | POA: Diagnosis not present

## 2023-05-12 DIAGNOSIS — E538 Deficiency of other specified B group vitamins: Secondary | ICD-10-CM | POA: Diagnosis not present

## 2023-05-12 DIAGNOSIS — E559 Vitamin D deficiency, unspecified: Secondary | ICD-10-CM | POA: Diagnosis not present

## 2023-05-12 DIAGNOSIS — E639 Nutritional deficiency, unspecified: Secondary | ICD-10-CM | POA: Diagnosis not present

## 2023-05-12 DIAGNOSIS — E039 Hypothyroidism, unspecified: Secondary | ICD-10-CM | POA: Diagnosis not present

## 2023-05-26 DIAGNOSIS — R5383 Other fatigue: Secondary | ICD-10-CM | POA: Diagnosis not present

## 2023-05-26 DIAGNOSIS — E063 Autoimmune thyroiditis: Secondary | ICD-10-CM | POA: Diagnosis not present

## 2023-05-26 DIAGNOSIS — E039 Hypothyroidism, unspecified: Secondary | ICD-10-CM | POA: Diagnosis not present

## 2023-05-26 DIAGNOSIS — G40909 Epilepsy, unspecified, not intractable, without status epilepticus: Secondary | ICD-10-CM | POA: Diagnosis not present

## 2023-06-16 ENCOUNTER — Other Ambulatory Visit: Payer: Self-pay | Admitting: Internal Medicine

## 2023-06-16 DIAGNOSIS — J4540 Moderate persistent asthma, uncomplicated: Secondary | ICD-10-CM

## 2023-06-24 ENCOUNTER — Telehealth: Payer: BC Managed Care – PPO | Admitting: Physician Assistant

## 2023-06-24 DIAGNOSIS — J019 Acute sinusitis, unspecified: Secondary | ICD-10-CM | POA: Diagnosis not present

## 2023-06-24 DIAGNOSIS — B9689 Other specified bacterial agents as the cause of diseases classified elsewhere: Secondary | ICD-10-CM | POA: Diagnosis not present

## 2023-06-24 MED ORDER — AMOXICILLIN-POT CLAVULANATE 875-125 MG PO TABS: 1.0000 | ORAL_TABLET | Freq: Two times a day (BID) | ORAL | 0 refills | Status: DC

## 2023-06-24 NOTE — Progress Notes (Signed)

## 2023-07-02 ENCOUNTER — Encounter: Payer: Self-pay | Admitting: Internal Medicine

## 2023-07-02 DIAGNOSIS — J4540 Moderate persistent asthma, uncomplicated: Secondary | ICD-10-CM

## 2023-07-02 MED ORDER — BUDESONIDE-FORMOTEROL FUMARATE 80-4.5 MCG/ACT IN AERO: 2.0000 | INHALATION_SPRAY | Freq: Two times a day (BID) | RESPIRATORY_TRACT | 2 refills | Status: DC

## 2023-07-03 ENCOUNTER — Other Ambulatory Visit: Payer: Self-pay | Admitting: *Deleted

## 2023-07-03 DIAGNOSIS — J4540 Moderate persistent asthma, uncomplicated: Secondary | ICD-10-CM

## 2023-07-03 MED ORDER — BUDESONIDE-FORMOTEROL FUMARATE 80-4.5 MCG/ACT IN AERO: 2.0000 | INHALATION_SPRAY | Freq: Two times a day (BID) | RESPIRATORY_TRACT | 2 refills | Status: DC

## 2023-07-03 MED ORDER — SYMBICORT 80-4.5 MCG/ACT IN AERO: 2.0000 | INHALATION_SPRAY | Freq: Two times a day (BID) | RESPIRATORY_TRACT | 2 refills | Status: DC

## 2023-08-26 DIAGNOSIS — F4320 Adjustment disorder, unspecified: Secondary | ICD-10-CM | POA: Diagnosis not present

## 2023-09-06 NOTE — Progress Notes (Deleted)
 Subjective:    Patient ID: Rebecca Silva, female    DOB: 1970-11-26   MRN: [id number]    Brief patient profile:  25 yowf never smoker with tendency to bronchitis all her life sev times a year never required prednisone or breathing treatments until mid 30s but started then on prn saba  But never needed maint inhaler or more than twice weekly saba but since Dec 2014 needed it more often as much as twice daily then admitted:   Admit date: 06/06/2014 Discharge date: 06/08/2014   Discharge Diagnoses:   Principal Problem:   Asthma exacerbation   Hypothyroidism   Leukocytosis   Viral URI with cough   Tachycardia    History of Present Illness  06/20/2014 1st [facility] office visit/ Sherene Sires  / referred by [facility] after above admit  Chief Complaint  Patient presents with   Pulmonary Consult    Self referral. Pt states dxed with Asthma approx 10 yrs ago. She c/o SOB and cough since mid Nov 2015.  Her cough is prod with minimal yellow sputum.  She is using rescue inhaler on average 2 x per day.     much better since admit but note over use of saba at baseline and still doe x exertion but not at rest/ sleeping or adls rec Plan A = automatic = qvar 80 Take 2 puffs first thing in am and then another 2 puffs about 12 hours later (other option is dulera but probably don't need it)  Plan B = backup saba hfa Plan C= Crisis = nebulizer if plan A and B don't work  Plan D = Doctor, call me if not satisfied with the above       11/01/2014 f/u ov/Anjalee Cope re: moderate persistent asthma  qvar 80 2 bid / more sob than cough  Chief Complaint  Patient presents with   Follow-up    PFT done today. Pt states that her breathing is unchanged since her last visit. She uses rescue inhaler 1-2 x per wk. She c/o Qvar sometimes making her feel that she is about to have an asthma attack.   marked decrease in need for saba since starting qvar / no noct flares  rec Change qvar to symbicort 160 Take 2 puffs  first thing in am and then another 2 puffs about 12 hours later.  Only use your albuterol as a rescue medication     02/08/2015 f/u ov/Destin Vinsant re: chronic asthma  Chief Complaint  Patient presents with   Follow-up    Pt states breathing is doing well. She uses albuterol about once per month.   Not limited by breathing from desired activities   If the drugstore doesn't honor your zero coupon please let me know  Continue symbicort 160 Take 2 puffs first thing in am and then another 2 puffs about 12 hours later.  Only use your albuterol as a rescue medication     12/05/2018  f/u ov/Keaten Mashek re: chronic asthma/ weaned herself down to symb 80 1 bid  Chief Complaint  Patient presents with   Follow-up    Breathing is doing well. She rarely uses her rescue inhaler or neb.   Dyspnea:  Still doing some running  In all conditioning Cough: no Sleeping: able to lie flat wakes up with bitemple ha  SABA use: rarely 02: none  rec Plan A = Automatic =  Symbicort 80 up to 2 puffs every 12 hours as needed  Plan B = Backup Only use  your albuterol inhaler as a rescue medication     02/02/2020  f/u ov/Arnaldo Heffron re: asthma /  2nd pfizer October 24 2019 / maint symb 80 2 q am  Chief Complaint  Patient presents with   Follow-up  Dyspnea:  Still  running in 45 hours  Cough: no  Sleeping: bed is flat / one pillow , no HA  SABA use: none   02: none  Rec Plan A = Automatic = Always=    symbicort 80 1-2 puffs each am then 1-2 puff 12 hours later are optional Plan B = Backup (to supplement plan A, not to replace it) Only use your albuterol inhaler as a rescue medication   02/11/2021  f/u ov/Zephyra Bernardi re:  asthma 1 y f/u on symbicort 80 2 puffs q am  Chief Complaint  Patient presents with   Follow-up    Reports 1 yr f/u. No complaints voiced   Dyspnea:  occ runs  Cough: none  Sleeping: flat / one pillow SABA use: not  needing any  02: none  Covid status:   had covid May 2022 p vax x 3  Rec No change rx       02/10/2022  f/u ov/Riddick Nuon re: asthma/yearly f/u  maint on symbicort 80 one bid   Chief Complaint  Patient presents with   Follow-up    Pt states she is about the same from last OV.  Dyspnea:  not doing as much running/ knee issues  - Not limited by breathing from desired activities   Cough: none  Sleeping: none flat one pillow  SABA use: none  02: none  Covid status:   vax x 2/ once infected  Rec     09/08/2023  f/u ov/Jaycelynn Knickerbocker re: ***   maint on ***  No chief complaint on file.   Dyspnea:  *** Cough: *** Sleeping: *** resp cc  SABA use: *** 02: ***  Lung cancer screening :  ***    No obvious day to day or daytime variability or assoc excess/ purulent sputum or mucus plugs or hemoptysis or cp or chest tightness, subjective wheeze or overt sinus or hb symptoms.    Also denies any obvious fluctuation of symptoms with weather or environmental changes or other aggravating or alleviating factors except as outlined above   No unusual exposure hx or h/o childhood pna/ asthma or knowledge of premature birth.  Current Allergies, Complete Past Medical History, Past Surgical History, Family History, and Social History were reviewed in [employer] record.  ROS  The following are not active complaints unless bolded Hoarseness, sore throat, dysphagia, dental problems, itching, sneezing,  nasal congestion or discharge of excess mucus or purulent secretions, ear ache,   fever, chills, sweats, unintended wt loss or wt gain, classically pleuritic or exertional cp,  orthopnea pnd or arm/hand swelling  or leg swelling, presyncope, palpitations, abdominal pain, anorexia, nausea, vomiting, diarrhea  or change in bowel habits or change in bladder habits, change in stools or change in urine, dysuria, hematuria,  rash, arthralgias, visual complaints, headache, numbness, weakness or ataxia or problems with walking or coordination,  change in mood or  memory.        No outpatient  medications have been marked as taking for the 09/08/23 encounter (Appointment) with Nyoka Cowden, MD.                             Objective:  Physical Exam  Wts  09/08/2023    ***  02/10/2022    184  02/11/2021    197   02/02/2020  195  12/05/2018   192  10/20/2017     188   02/08/2015   198   11/01/2014  199 07/24/2014     200       06/20/14 199 lb (90.266 kg)  06/06/14 196 lb (88.905 kg)  06/06/14 196 lb (88.905 kg)    Vital signs reviewed  09/08/2023  - Note at rest 02 sats  ***% on ***   General appearance:    ***                Assessment & Plan:

## 2023-09-08 ENCOUNTER — Ambulatory Visit: Payer: BC Managed Care – PPO | Admitting: Internal Medicine

## 2023-09-09 ENCOUNTER — Ambulatory Visit: Payer: BC Managed Care – PPO | Admitting: Internal Medicine

## 2023-09-09 DIAGNOSIS — F4320 Adjustment disorder, unspecified: Secondary | ICD-10-CM | POA: Diagnosis not present

## 2023-09-09 NOTE — Progress Notes (Deleted)
 Subjective:    Patient ID: Rebecca Silva, female    DOB: 1970-09-20   MRN: [id number]    Brief patient profile:  51 yowf never smoker with tendency to bronchitis all her life sev times a year never required prednisone or breathing treatments until mid 30s but started then on prn saba  But never needed maint inhaler or more than twice weekly saba but since Dec 2014 needed it more often as much as twice daily then admitted:   Admit date: 06/06/2014 Discharge date: 06/08/2014   Discharge Diagnoses:     Asthma exacerbation   Hypothyroidism   Leukocytosis   Viral URI with cough   Tachycardia    History of Present Illness  06/20/2014 1st [facility] office visit/ Sherene Sires  / referred by [facility] after above admit  Chief Complaint  Patient presents with   Pulmonary Consult    Self referral. Pt states dxed with Asthma approx 10 yrs ago. She c/o SOB and cough since mid Nov 2015.  Her cough is prod with minimal yellow sputum.  She is using rescue inhaler on average 2 x per day.     much better since admit but note over use of saba at baseline and still doe x exertion but not at rest/ sleeping or adls rec Plan A = automatic = qvar 80 Take 2 puffs first thing in am and then another 2 puffs about 12 hours later (other option is dulera but probably don't need it)  Plan B = backup saba hfa Plan C= Crisis = nebulizer if plan A and B don't work  Plan D = Doctor, call me if not satisfied with the above     12/05/2018  f/u ov/Srihith Aquilino re: chronic asthma/ weaned herself down to symb 80 1 bid  Chief Complaint  Patient presents with   Follow-up    Breathing is doing well. She rarely uses her rescue inhaler or neb.   Dyspnea:  Still doing some running  In all conditioning Cough: no Sleeping: able to lie flat wakes up with bitemple ha  SABA use: rarely 02: none  rec Plan A = Automatic =  Symbicort 80 up to 2 puffs every 12 hours as needed  Plan B = Backup Only use your albuterol inhaler as a  rescue medication      02/10/2022  f/u ov/Brightyn Mozer re: asthma/yearly f/u  maint on symbicort 80 one bid   Chief Complaint  Patient presents with   Follow-up    Pt states she is about the same from last OV.  Dyspnea:  not doing as much running/ knee issues  - Not limited by breathing from desired activities   Cough: none  Sleeping: none flat one pillow  SABA use: none  02: none  Rec Symbicort 80 is  0 -2 puffs every 12 hours  - remember only taper down or off after a full week of no symptoms or need for albuterol Please schedule a follow up visit in 12 months but call sooner if needed     09/09/2023  f/u ov/Miangel Flom re: asthma    maint on ***  No chief complaint on file.   Dyspnea:  *** Cough: *** Sleeping: *** resp cc  SABA use: *** 02: ***  Lung cancer screening :  ***    No obvious day to day or daytime variability or assoc excess/ purulent sputum or mucus plugs or hemoptysis or cp or chest tightness, subjective wheeze or overt sinus or hb symptoms.  Also denies any obvious fluctuation of symptoms with weather or environmental changes or other aggravating or alleviating factors except as outlined above   No unusual exposure hx or h/o childhood pna/ asthma or knowledge of premature birth.  Current Allergies, Complete Past Medical History, Past Surgical History, Family History, and Social History were reviewed in [employer] record.  ROS  The following are not active complaints unless bolded Hoarseness, sore throat, dysphagia, dental problems, itching, sneezing,  nasal congestion or discharge of excess mucus or purulent secretions, ear ache,   fever, chills, sweats, unintended wt loss or wt gain, classically pleuritic or exertional cp,  orthopnea pnd or arm/hand swelling  or leg swelling, presyncope, palpitations, abdominal pain, anorexia, nausea, vomiting, diarrhea  or change in bowel habits or change in bladder habits, change in stools or change in urine,  dysuria, hematuria,  rash, arthralgias, visual complaints, headache, numbness, weakness or ataxia or problems with walking or coordination,  change in mood or  memory.        No outpatient medications have been marked as taking for the 09/09/23 encounter (Appointment) with Nyoka Cowden, MD.                             Objective:   Physical Exam  Wts  09/09/2023    ***  02/10/2022    184  02/11/2021    197   02/02/2020  195  12/05/2018   192  10/20/2017     188   02/08/2015   198   11/01/2014  199 07/24/2014     200       06/20/14 199 lb (90.266 kg)  06/06/14 196 lb (88.905 kg)  06/06/14 196 lb (88.905 kg)    Vital signs reviewed  09/09/2023  - Note at rest 02 sats  ***% on ***   General appearance:    ***                Assessment & Plan:

## 2023-09-30 DIAGNOSIS — F4320 Adjustment disorder, unspecified: Secondary | ICD-10-CM | POA: Diagnosis not present

## 2023-10-14 DIAGNOSIS — F4320 Adjustment disorder, unspecified: Secondary | ICD-10-CM | POA: Diagnosis not present

## 2023-11-01 ENCOUNTER — Telehealth: Admitting: Physician Assistant

## 2023-11-01 DIAGNOSIS — B9689 Other specified bacterial agents as the cause of diseases classified elsewhere: Secondary | ICD-10-CM | POA: Diagnosis not present

## 2023-11-01 DIAGNOSIS — J019 Acute sinusitis, unspecified: Secondary | ICD-10-CM | POA: Diagnosis not present

## 2023-11-01 MED ORDER — DOXYCYCLINE HYCLATE 100 MG PO TABS
100.0000 mg | ORAL_TABLET | Freq: Two times a day (BID) | ORAL | 0 refills | Status: DC
Start: 2023-11-01 — End: 2023-11-29

## 2023-11-01 NOTE — Progress Notes (Signed)

## 2023-11-04 DIAGNOSIS — F4320 Adjustment disorder, unspecified: Secondary | ICD-10-CM | POA: Diagnosis not present

## 2023-11-11 DIAGNOSIS — E063 Autoimmune thyroiditis: Secondary | ICD-10-CM | POA: Diagnosis not present

## 2023-11-11 DIAGNOSIS — E538 Deficiency of other specified B group vitamins: Secondary | ICD-10-CM | POA: Diagnosis not present

## 2023-11-11 DIAGNOSIS — N926 Irregular menstruation, unspecified: Secondary | ICD-10-CM | POA: Diagnosis not present

## 2023-11-11 DIAGNOSIS — E559 Vitamin D deficiency, unspecified: Secondary | ICD-10-CM | POA: Diagnosis not present

## 2023-11-11 DIAGNOSIS — E039 Hypothyroidism, unspecified: Secondary | ICD-10-CM | POA: Diagnosis not present

## 2023-11-11 DIAGNOSIS — R5383 Other fatigue: Secondary | ICD-10-CM | POA: Diagnosis not present

## 2023-11-17 DIAGNOSIS — G40909 Epilepsy, unspecified, not intractable, without status epilepticus: Secondary | ICD-10-CM | POA: Diagnosis not present

## 2023-11-17 DIAGNOSIS — E039 Hypothyroidism, unspecified: Secondary | ICD-10-CM | POA: Diagnosis not present

## 2023-11-17 DIAGNOSIS — R5383 Other fatigue: Secondary | ICD-10-CM | POA: Diagnosis not present

## 2023-11-17 DIAGNOSIS — E063 Autoimmune thyroiditis: Secondary | ICD-10-CM | POA: Diagnosis not present

## 2023-11-25 DIAGNOSIS — F4320 Adjustment disorder, unspecified: Secondary | ICD-10-CM | POA: Diagnosis not present

## 2023-11-28 NOTE — Progress Notes (Unsigned)
 Subjective:    Patient ID: Rebecca Silva, female    DOB: 10/11/1970   MRN: [id number]    Brief patient profile:  6 yowf never smoker with tendency to bronchitis all her life sev times a year never required prednisone  or breathing treatments until mid 30s but started then on prn saba  But never needed maint inhaler or more than twice weekly saba but since Dec 2014 needed it more often as much as twice daily then admitted:   Admit date: 06/06/2014 Discharge date: 06/08/2014   Discharge Diagnoses:   Principal Problem:   Asthma exacerbation   Hypothyroidism   Leukocytosis   Viral URI with cough   Tachycardia    History of Present Illness  06/20/2014 1st [facility] office visit/ Rebecca Silva  / referred by [facility] after above admit  Chief Complaint  Patient presents with   Pulmonary Consult    Self referral. Pt states dxed with Asthma approx 10 yrs ago. She c/o SOB and cough since mid Nov 2015.  Her cough is prod with minimal yellow sputum.  She is using rescue inhaler on average 2 x per day.     much better since admit but note over use of saba at baseline and still doe x exertion but not at rest/ sleeping or adls rec Plan A = automatic = qvar  80 Take 2 puffs first thing in am and then another 2 puffs about 12 hours later (other option is dulera  but probably don't need it)  Plan B = backup saba hfa Plan C= Crisis = nebulizer if plan A and B don't work  Plan D = Doctor, call me if not satisfied with the above       11/01/2014 f/u ov/Rebecca Silva re: moderate persistent asthma  qvar  80 2 bid / more sob than cough  Chief Complaint  Patient presents with   Follow-up    PFT done today. Pt states that her breathing is unchanged since her last visit. She uses rescue inhaler 1-2 x per wk. She c/o Qvar  sometimes making her feel that she is about to have an asthma attack.   marked decrease in need for saba since starting qvar  / no noct flares  rec Change qvar  to symbicort  160 Take 2 puffs  first thing in am and then another 2 puffs about 12 hours later.  Only use your albuterol  as a rescue medication     02/02/2020  f/u ov/Rebecca Silva re: asthma /  2nd pfizer October 24 2019 / maint symb 80 2 q am  Chief Complaint  Patient presents with   Follow-up  Dyspnea:  Still  running in 45 hours  Cough: no  Sleeping: bed is flat / one pillow , no HA  SABA use: none   02: none  Rec Plan A = Automatic = Always=    symbicort  80 1-2 puffs each am then 1-2 puff 12 hours later are optional Plan B = Backup (to supplement plan A, not to replace it) Only use your albuterol  inhaler as a rescue medication    02/10/2022  f/u ov/Rebecca Silva re: asthma/yearly f/u  maint on symbicort  80 one bid   Chief Complaint  Patient presents with   Follow-up    Pt states she is about the same from last OV.  Dyspnea:  not doing as much running/ knee issues  - Not limited by breathing from desired activities   Cough: none  Sleeping: none flat one pillow  SABA use: none  02:  none  Covid status:   vax x 2/ once infected  Rec    11/29/2023  f/u ov/Rebecca Silva re: ***   maint on ***  No chief complaint on file.   Dyspnea:  *** Cough: *** Sleeping: *** resp cc  SABA use: *** 02: ***  Lung cancer screening :  ***    No obvious day to day or daytime variability or assoc excess/ purulent sputum or mucus plugs or hemoptysis or cp or chest tightness, subjective wheeze or overt sinus or hb symptoms.    Also denies any obvious fluctuation of symptoms with weather or environmental changes or other aggravating or alleviating factors except as outlined above   No unusual exposure hx or h/o childhood pna/ asthma or knowledge of premature birth.  Current Allergies, Complete Past Medical History, Past Surgical History, Family History, and Social History were reviewed in [employer] record.  ROS  The following are not active complaints unless bolded Hoarseness, sore throat, dysphagia, dental  problems, itching, sneezing,  nasal congestion or discharge of excess mucus or purulent secretions, ear ache,   fever, chills, sweats, unintended wt loss or wt gain, classically pleuritic or exertional cp,  orthopnea pnd or arm/hand swelling  or leg swelling, presyncope, palpitations, abdominal pain, anorexia, nausea, vomiting, diarrhea  or change in bowel habits or change in bladder habits, change in stools or change in urine, dysuria, hematuria,  rash, arthralgias, visual complaints, headache, numbness, weakness or ataxia or problems with walking or coordination,  change in mood or  memory.        No outpatient medications have been marked as taking for the 11/29/23 encounter (Appointment) with Rebecca Formica, MD.                      Objective:   Physical Exam  Wts  11/29/2023    ***  02/10/2022   184  02/11/2021    197   02/02/2020  195  12/05/2018   192  10/20/2017     188   02/08/2015   198   11/01/2014  199 07/24/2014     200       06/20/14 199 lb (90.266 kg)  06/06/14 196 lb (88.905 kg)  06/06/14 196 lb (88.905 kg)      Vital signs reviewed  11/29/2023  - Note at rest 02 sats  ***% on ***   General appearance:    ***             Assessment & Plan:

## 2023-11-29 ENCOUNTER — Ambulatory Visit (INDEPENDENT_AMBULATORY_CARE_PROVIDER_SITE_OTHER): Payer: BC Managed Care – PPO | Admitting: Internal Medicine

## 2023-11-29 ENCOUNTER — Encounter: Payer: Self-pay | Admitting: Internal Medicine

## 2023-11-29 DIAGNOSIS — E039 Hypothyroidism, unspecified: Secondary | ICD-10-CM | POA: Diagnosis not present

## 2023-11-29 DIAGNOSIS — R000 Tachycardia, unspecified: Secondary | ICD-10-CM

## 2023-11-29 DIAGNOSIS — D72829 Elevated white blood cell count, unspecified: Secondary | ICD-10-CM | POA: Diagnosis not present

## 2023-11-29 DIAGNOSIS — J069 Acute upper respiratory infection, unspecified: Secondary | ICD-10-CM | POA: Diagnosis not present

## 2023-11-29 DIAGNOSIS — J45901 Unspecified asthma with (acute) exacerbation: Secondary | ICD-10-CM | POA: Diagnosis not present

## 2023-11-29 DIAGNOSIS — R059 Cough, unspecified: Secondary | ICD-10-CM

## 2023-11-29 DIAGNOSIS — J4540 Moderate persistent asthma, uncomplicated: Secondary | ICD-10-CM

## 2023-11-29 MED ORDER — BUDESONIDE-FORMOTEROL FUMARATE 80-4.5 MCG/ACT IN AERO: 2.0000 | INHALATION_SPRAY | Freq: Two times a day (BID) | RESPIRATORY_TRACT | 11 refills | Status: AC

## 2023-11-29 MED ORDER — ALBUTEROL SULFATE HFA 108 (90 BASE) MCG/ACT IN AERS: INHALATION_SPRAY | RESPIRATORY_TRACT | 1 refills | Status: AC

## 2023-11-29 NOTE — Patient Instructions (Signed)
 Call if want to try the symbicort  160 next x one month  Exercise aerobics  3 x weekly x 30 min each   Please schedule a follow up visit in 12 months but call sooner if needed

## 2023-11-29 NOTE — Assessment & Plan Note (Signed)
 Onset Dec 2014 - 07/24/2014   > trial of qvar  80 2bid - PFTs 11/01/2014 FEV1  2.36 (69%) ratio 74 p 33% improvement from saba/ dlco 80% -  11/01/2014 p extensive coaching HFA effectiveness =    75% > try symbicort  160 2bid > marked clinical  improvement 02/08/15  -  06/10/2016    try symb 80 2bid > all symptoms resolved  - 02/02/2020    continue symbicort  80  1-2 each am  And pm doses optional  -11/29/2023  After extensive coaching inhaler device,  effectiveness =   60  (short Ti)   Despite suboptimal hfa: All goals of chronic asthma control met including optimal function and elimination of symptoms with minimal need for rescue therapy.  Contingencies discussed in full including contacting this office immediately if not controlling the symptoms using the rule of two's.     Re SABA :  I spent extra time with pt today reviewing appropriate use of albuterol  for prn use on exertion with the following points: 1) saba is for relief of sob that does not improve by walking a slower pace or resting but rather if the pt does not improve after trying this first. 2) If the pt is convinced, as many are, that saba helps recover from activity faster then it's easy to tell if this is the case by re-challenging : ie stop, take the inhaler, then p 5 minutes try the exact same activity (intensity of workload) that just caused the symptoms and see if they are substantially diminished or not after saba 3) if there is an activity that reproducibly causes the symptoms, try the saba 15 min before the activity on alternate days   If in fact the saba really does help, then fine to continue to use it prn but advised may need to look closer at the maintenance regimen being used (symbicort  80 vs 160 2bid to achieve better control of airways disease with exertion.   F/u can be yearly          Each maintenance medication was reviewed in detail including emphasizing most importantly the difference between maintenance and prns and  under what circumstances the prns are to be triggered using an action plan format where appropriate.  Total time for H and P, chart review, counseling, reviewing hfa  device(s) and generating customized AVS unique to this office visit / same day charting = 

## 2023-12-09 DIAGNOSIS — F4320 Adjustment disorder, unspecified: Secondary | ICD-10-CM | POA: Diagnosis not present

## 2023-12-27 ENCOUNTER — Telehealth: Admitting: Physician Assistant

## 2023-12-27 DIAGNOSIS — H01002 Unspecified blepharitis right lower eyelid: Secondary | ICD-10-CM | POA: Diagnosis not present

## 2023-12-27 MED ORDER — ERYTHROMYCIN 5 MG/GM OP OINT: 1.0000 | TOPICAL_OINTMENT | Freq: Every day | OPHTHALMIC | 0 refills | Status: AC

## 2023-12-27 NOTE — Progress Notes (Signed)
  E-Visit for Stye   We are sorry that you are not feeling well. Here is how we plan to help!  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   We have made appropriate suggestions for you based upon your presentation:  I have prescribed Erythromycin Ophthalmic ointment 0.5% Apply topically in affected eye daily at bedtime for 5 days. To apply: Tilt your head back, look upward, and pull down the lower eyelid to make a pouch. Hold the dropper directly over your eye and place one drop into the pouch. Look downward and gently close your eyes for 1 to 2 minutes. Place one finger at the corner of your eye (near the nose) and apply gentle pressure. This will prevent the medication from draining out. Try not to blink and do not rub your eye. Repeat these steps if your dose is for more than 1 drop and for your other eye if so directed. The dosage is based on your medical condition and response to treatment.   HOME CARE:  Wash your hands often! Let the stye open on its own. Don't squeeze or open it. Don't rub your eyes. This can irritate your eyes and let in bacteria.  If you need to touch your eyes, wash your hands first. Don't wear eye makeup or contact lenses until the area has healed.  GET HELP RIGHT AWAY IF:  Your symptoms do not improve. You develop blurred or loss of vision. Your symptoms worsen (increased discharge, pain or redness).   Thank you for choosing an e-visit.  Your e-visit answers were reviewed by a board certified advanced clinical practitioner to complete your personal care plan. Depending upon the condition, your plan could have included both over the counter or prescription medications.  Please review your pharmacy choice. Make sure the pharmacy is open so you can pick up prescription now. If there is a problem, you may contact your provider  through [employer] and have the prescription routed to another pharmacy.  Your safety is important to us . If you have drug allergies check your prescription carefully.   For the next 24 hours you can use MyChart to ask questions about today's visit, request a non-urgent call back, or ask for a work or school excuse. You will get an email in the next two days asking about your experience. I hope that your e-visit has been valuable and will speed your recovery.     I have spent 5 minutes in review of e-visit questionnaire, review and updating patient chart, medical decision making and response to patient.   Angelia Kelp, PA-C

## 2023-12-30 DIAGNOSIS — F4320 Adjustment disorder, unspecified: Secondary | ICD-10-CM | POA: Diagnosis not present

## 2024-01-03 NOTE — Progress Notes (Signed)
 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   Subjective:  Rebecca Silva Husband is a 53 year old right-handed woman with history of Hashimoto's thyroiditis, asthma and Wolff-Parkinson-White who follows up for primary generalized epilepsy with absence seizures and worsening tremor.     UPDATE: She takes Keppra  750mg  in AM, 750mg  at noon and 1500mg  at bedtime.  She splits the morning dose of Keppra  from 1500mg  twice daily to 750mg  in AM, 750mg  at noon because the full 1500mg  in the morning makes her sleepy.   No recurrent seizures.   Tremor is stable.     HISTORY: Primary generalized epilepsy: She has had these episodes of eye flutter at least since adolescence.  Suddenly, both of her eyes will roll back for one or two seconds.  She is not aware that it is happening.  Initially, they occured several times a day, every few minutes.  There is a question about whether she loses awareness during an episode.  If you start a sentence when it occurs, she is not aware of what you said.  If she is speaking, she will stop and say "um" when it occurs.  She denies headache or focal abnormalities.  She denies any abnormal movements or gait abnormalities.  They occur spontaneously and not triggered or relieved by anything particular.  She says that her mom has it but less severe.  Her grandfather had epilepsy.  She had an ambulatory EEG, which showed brief intermittent bursts of generalized slowing with imbedded sharp waves, suggestive of primary generalized epilepsy.  She also had an MRI of the brain performed on 12/13/14, which was personally reviewed and showed no evidence of acute infarct, mass lesion, hemorrhage or mesial temporal sclerosis.    Essential tremor: She reports that for the past few years, her left hand is sometimes shaky when she holds something in her left hand such as a mug or making a fist.  It does not occur at rest.   After a few years, it started affecting her right hand.  She denies difficulty moving her arms, hands or feet.  No gait abnormality.  No difficulty swallowing.  Sense of smell is okay.  She denies family history of tremors.  PAST MEDICAL HISTORY: Past Medical History:  Diagnosis Date   Asthma    Heart murmur    History of UTI    Hypothyroidism    Seasonal allergies    Seizure disorder (HCC)    none in the last year per pt   Urine incontinence    Wolff-Parkinson-White pattern    a. noted on EKG in 07/2016. Echo showed a preserved EF of 55-60% with no regional WMA. ETT showed WPW waveforms with no evidence of HB.     MEDICATIONS: Current Outpatient Medications on File Prior to Visit  Medication Sig Dispense Refill   albuterol  (PROAIR  HFA) 108 (90 Base) MCG/ACT inhaler UP  2 puffs every 4 hours as needed 18 g 1   budesonide -formoterol  (SYMBICORT ) 80-4.5 MCG/ACT inhaler Inhale 2 puffs into the lungs in the morning and at bedtime. 10.2 g 11   cholecalciferol (VITAMIN D3) 25 MCG (1000 UT) tablet Take 5,000 Units by mouth daily. 2-3 times a week     estradiol  (VIVELLE -DOT) 0.05 MG/24HR patch  Place 1 patch onto the skin once a week.     levETIRAcetam  (KEPPRA ) 750 MG tablet 1 pill in morning, 1 pill afternoon and 2 pills at bedtime 360 tablet 3   MAGNESIUM  PO magnesium      Multiple Vitamin (MULTIVITAMIN) tablet Take 1 tablet by mouth daily.     progesterone  (PROMETRIUM ) 100 MG capsule Take 200 mg by mouth at bedtime.     SYNTHROID  25 MCG tablet Take 25 mcg by mouth every morning.     vitamin B-12 (CYANOCOBALAMIN ) 50 MCG tablet Take 50 mcg by mouth daily.     Zinc  Sulfate (ZINC  15 PO) zinc      No current facility-administered medications on file prior to visit.    ALLERGIES: No Known  Allergies  FAMILY HISTORY: Family History  Problem Relation Age of Onset   Heart disease Mother 23       open heart surgery   Heart disease Maternal Grandmother    Epilepsy Maternal Grandfather    Colon cancer Neg Hx       Objective:  Blood pressure 100/67, pulse 73, height 5' 9 (1.753 m), weight 195 lb (88.5 kg), SpO2 98%. General: No acute distress.  Patient appears well-groomed.   Head:  Normocephalic/atraumatic Neck:  Supple.  No paraspinal tenderness.  Full range of motion. Heart:  Regular rate and rhythm. Neuro:  Alert and oriented.  Speech fluent and not dysarthric.  Language intact.  CN II-XII intact.  Bulk and tone normal.  Muscle strength 5/5 throughout.  Left greater than right postural and kinetic tremor in hands. Sensation to light touch intact.  Deep tendon reflexes 2+ throughout, toes downgoing.  Gait normal.  Romberg negative.    Rebecca Members, DO  CC: Bambi Lever, MD

## 2024-01-04 ENCOUNTER — Ambulatory Visit (INDEPENDENT_AMBULATORY_CARE_PROVIDER_SITE_OTHER): Payer: BC Managed Care – PPO | Admitting: Neurology

## 2024-01-04 ENCOUNTER — Encounter: Payer: Self-pay | Admitting: Neurology

## 2024-01-04 VITALS — BP 100/67 | HR 73 | Ht 69.0 in | Wt 195.0 lb

## 2024-01-04 DIAGNOSIS — G250 Essential tremor: Secondary | ICD-10-CM | POA: Diagnosis not present

## 2024-01-04 DIAGNOSIS — G40309 Generalized idiopathic epilepsy and epileptic syndromes, not intractable, without status epilepticus: Secondary | ICD-10-CM | POA: Diagnosis not present

## 2024-01-04 MED ORDER — LEVETIRACETAM 750 MG PO TABS: ORAL_TABLET | ORAL | 3 refills | Status: AC

## 2024-01-13 DIAGNOSIS — F4320 Adjustment disorder, unspecified: Secondary | ICD-10-CM | POA: Diagnosis not present

## 2024-01-27 DIAGNOSIS — F4320 Adjustment disorder, unspecified: Secondary | ICD-10-CM | POA: Diagnosis not present

## 2024-01-28 DIAGNOSIS — H02885 Meibomian gland dysfunction left lower eyelid: Secondary | ICD-10-CM | POA: Diagnosis not present

## 2024-02-01 ENCOUNTER — Encounter: Payer: Self-pay | Admitting: Family Medicine

## 2024-02-01 ENCOUNTER — Ambulatory Visit (INDEPENDENT_AMBULATORY_CARE_PROVIDER_SITE_OTHER): Admitting: Family Medicine

## 2024-02-01 VITALS — BP 122/76 | HR 75 | Temp 97.6°F | Ht 69.0 in | Wt 195.0 lb

## 2024-02-01 DIAGNOSIS — E782 Mixed hyperlipidemia: Secondary | ICD-10-CM | POA: Diagnosis not present

## 2024-02-01 DIAGNOSIS — E559 Vitamin D deficiency, unspecified: Secondary | ICD-10-CM

## 2024-02-01 DIAGNOSIS — E063 Autoimmune thyroiditis: Secondary | ICD-10-CM | POA: Diagnosis not present

## 2024-02-01 DIAGNOSIS — G40909 Epilepsy, unspecified, not intractable, without status epilepticus: Secondary | ICD-10-CM | POA: Diagnosis not present

## 2024-02-01 DIAGNOSIS — Z7689 Persons encountering health services in other specified circumstances: Secondary | ICD-10-CM

## 2024-02-01 NOTE — Progress Notes (Signed)
 New Patient Office Visit  Subjective    Patient ID: Rebecca Silva, female    DOB: 07-15-1971  Age: 53 y.o. MRN: [id number]  CC:  Chief Complaint  Patient presents with   Establish Care    No concerns    HPI Rebecca Silva presents to establish care  [facility]  [facility]  Dr. Rocco Dr. Wert-pulmonology  Past medical history includes asthma, epilepsy controlled by Keppra , thyroid  disorder and hormone therapy managed by [facility].  Married 2 kids and 1 grandchild Works as a Insurance underwriter  Has a garden and is active     Outpatient Encounter Medications as of 02/01/2024  Medication Sig   albuterol  (PROAIR  HFA) 108 (90 Base) MCG/ACT inhaler UP  2 puffs every 4 hours as needed   budesonide -formoterol  (SYMBICORT ) 80-4.5 MCG/ACT inhaler Inhale 2 puffs into the lungs in the morning and at bedtime.   cholecalciferol (VITAMIN D3) 25 MCG (1000 UT) tablet Take 5,000 Units by mouth daily. 2-3 times a week   estradiol  (VIVELLE -DOT) 0.05 MG/24HR patch Place 1 patch onto the skin once a week.   levETIRAcetam  (KEPPRA ) 750 MG tablet 1 pill in morning, 1 pill afternoon and 2 pills at bedtime   MAGNESIUM  PO magnesium    Multiple Vitamin (MULTIVITAMIN) tablet Take 1 tablet by mouth daily.   NP THYROID  120 MG tablet Take 120 mg by mouth every morning.   progesterone  (PROMETRIUM ) 100 MG capsule Take 200 mg by mouth at bedtime.   SYNTHROID  25 MCG tablet Take 25 mcg by mouth every morning.   vitamin B-12 (CYANOCOBALAMIN ) 50 MCG tablet Take 50 mcg by mouth daily.   Zinc  Sulfate (ZINC  15 PO) zinc    No facility-administered encounter medications on file as of 02/01/2024.    Past Medical History:  Diagnosis Date   Asthma    Heart murmur    History of UTI    Hypothyroidism    Seasonal allergies    Seizure disorder (HCC)    none in the last year per pt   Urine incontinence    Wolff-Parkinson-White pattern    a. noted on EKG in 07/2016.  Echo showed a preserved EF of 55-60% with no regional WMA. ETT showed WPW waveforms with no evidence of HB.     Past Surgical History:  Procedure Laterality Date   NO PAST SURGERIES      Family History  Problem Relation Age of Onset   Heart disease Mother 6       open heart surgery   Heart disease Maternal Grandmother    Epilepsy Maternal Grandfather    Colon cancer Neg Hx     Social History   Socioeconomic History   Marital status: Married    Spouse name: Not on file   Number of children: Not on file   Years of education: Not on file   Highest education level: Not on file  Occupational History   Occupation: Paralegal   Tobacco Use   Smoking status: Never    Passive exposure: Past   Smokeless tobacco: Never  Vaping Use   Vaping status: Never Used  Substance and Sexual Activity   Alcohol use: No    Alcohol/week: 0.0 standard drinks of alcohol   Drug use: No   Sexual activity: Yes    Partners: Male  Other Topics Concern   Not on file  Social History Narrative   ParalegalMarried: 23 yrsChildren: 19 yrs and 15 yrsRegular exercise: walking/running 2 x a wkCaffeine use:  coffee daily, sweet tea right handed    Social Drivers of Corporate investment banker Strain: Not on file  Food Insecurity: Not on file  Transportation Needs: Not on file  Physical Activity: Not on file  Stress: Not on file  Social Connections: Not on file  Intimate Partner Violence: Not on file    Review of Systems  Constitutional:  Negative for chills, fever and malaise/fatigue.  Eyes:  Negative for blurred vision and double vision.  Respiratory:  Negative for cough and shortness of breath.   Cardiovascular:  Negative for chest pain, palpitations and leg swelling.  Gastrointestinal:  Negative for abdominal pain, constipation, diarrhea, nausea and vomiting.  Genitourinary:  Negative for dysuria, frequency and urgency.  Musculoskeletal:  Positive for myalgias.  Neurological:  Negative for  dizziness, focal weakness and headaches.  Psychiatric/Behavioral:  Negative for depression. The patient is not nervous/anxious.         Objective    BP 122/76 (BP Location: Left Arm, Patient Position: Sitting)   Pulse 75   Temp 97.6 F (36.4 C) (Temporal)   Ht 5' 9 (1.753 m)   Wt 195 lb (88.5 kg)   SpO2 97%   BMI 28.80 kg/m   Physical Exam Constitutional:      General: She is not in acute distress.    Appearance: She is not ill-appearing.  Eyes:     Extraocular Movements: Extraocular movements intact.     Conjunctiva/sclera: Conjunctivae normal.  Cardiovascular:     Rate and Rhythm: Normal rate and regular rhythm.  Pulmonary:     Effort: Pulmonary effort is normal.     Breath sounds: Normal breath sounds.  Musculoskeletal:     Cervical back: Normal range of motion and neck supple.  Skin:    General: Skin is warm and dry.  Neurological:     General: No focal deficit present.     Mental Status: She is alert and oriented to person, place, and time.     Cranial Nerves: No cranial nerve deficit.     Motor: No weakness.     Coordination: Coordination normal.     Gait: Gait normal.  Psychiatric:        Mood and Affect: Mood normal.        Behavior: Behavior normal.        Thought Content: Thought content normal.         Assessment & Plan:   Problem List Items Addressed This Visit     Hypothyroidism - Primary   Vitamin D  deficiency   Other Visit Diagnoses       Mixed hyperlipidemia         Seizure disorder (HCC)         Encounter to establish care          She is a pleasant 53 year old female who is here to establish care.  She previously saw Dr. Rollene in our practice in 2020. She has several specialists including OB/GYN, pulmonology, neurology and integrative medicine at [facility]. Recent labs.  She will upload these to her portal. She is in favor of returning for a CPE in approximately 6 weeks.  Return in about 6 weeks (around 03/14/2024) for  fasting CPE.   Boby Mackintosh, NP-C

## 2024-02-04 DIAGNOSIS — B0052 Herpesviral keratitis: Secondary | ICD-10-CM | POA: Diagnosis not present

## 2024-02-08 DIAGNOSIS — B0052 Herpesviral keratitis: Secondary | ICD-10-CM | POA: Diagnosis not present

## 2024-02-10 DIAGNOSIS — B0052 Herpesviral keratitis: Secondary | ICD-10-CM | POA: Diagnosis not present

## 2024-02-15 DIAGNOSIS — B0052 Herpesviral keratitis: Secondary | ICD-10-CM | POA: Diagnosis not present

## 2024-02-17 DIAGNOSIS — F4320 Adjustment disorder, unspecified: Secondary | ICD-10-CM | POA: Diagnosis not present

## 2024-03-02 DIAGNOSIS — F4320 Adjustment disorder, unspecified: Secondary | ICD-10-CM | POA: Diagnosis not present

## 2024-03-15 ENCOUNTER — Encounter: Admitting: Family Medicine

## 2024-03-28 DIAGNOSIS — B0052 Herpesviral keratitis: Secondary | ICD-10-CM | POA: Diagnosis not present

## 2024-04-11 DIAGNOSIS — F4320 Adjustment disorder, unspecified: Secondary | ICD-10-CM | POA: Diagnosis not present

## 2024-04-17 DIAGNOSIS — H524 Presbyopia: Secondary | ICD-10-CM | POA: Diagnosis not present

## 2024-04-17 DIAGNOSIS — B0052 Herpesviral keratitis: Secondary | ICD-10-CM | POA: Diagnosis not present

## 2024-04-18 DIAGNOSIS — F4320 Adjustment disorder, unspecified: Secondary | ICD-10-CM | POA: Diagnosis not present

## 2024-04-20 NOTE — Progress Notes (Unsigned)
 Referring-Vickie Lendia, NP Reason for referral-WPW  HPI: 53 year old female for evaluation of WPW at request of Boby Lendia, NP.  Patient seen previously but not since 2018.  Exercise treadmill January 2018 borderline with 2 mm of horizontal to upsloping ST depression in the inferolateral leads.  Findings felt more consistent with preexcitation.  Echocardiogram 2018 showed normal LV function.  Cardiology asked to evaluate.  Current Outpatient Medications  Medication Sig Dispense Refill   albuterol  (PROAIR  HFA) 108 (90 Base) MCG/ACT inhaler UP  2 puffs every 4 hours as needed 18 g 1   budesonide -formoterol  (SYMBICORT ) 80-4.5 MCG/ACT inhaler Inhale 2 puffs into the lungs in the morning and at bedtime. 10.2 g 11   cholecalciferol (VITAMIN D3) 25 MCG (1000 UT) tablet Take 5,000 Units by mouth daily. 2-3 times a week     estradiol  (VIVELLE -DOT) 0.05 MG/24HR patch Place 1 patch onto the skin once a week.     levETIRAcetam  (KEPPRA ) 750 MG tablet 1 pill in morning, 1 pill afternoon and 2 pills at bedtime 360 tablet 3   MAGNESIUM  PO magnesium      Multiple Vitamin (MULTIVITAMIN) tablet Take 1 tablet by mouth daily.     NP THYROID  120 MG tablet Take 120 mg by mouth every morning.     progesterone  (PROMETRIUM ) 100 MG capsule Take 200 mg by mouth at bedtime.     SYNTHROID  25 MCG tablet Take 25 mcg by mouth every morning.     vitamin B-12 (CYANOCOBALAMIN ) 50 MCG tablet Take 50 mcg by mouth daily.     Zinc  Sulfate (ZINC  15 PO) zinc      No current facility-administered medications for this visit.    No Known Allergies   Past Medical History:  Diagnosis Date   Asthma    Heart murmur    History of UTI    Hypothyroidism    Seasonal allergies    Seizure disorder (HCC)    none in the last year per pt   Urine incontinence    Wolff-Parkinson-White pattern    a. noted on EKG in 07/2016. Echo showed a preserved EF of 55-60% with no regional WMA. ETT showed WPW waveforms with no evidence of HB.      Past Surgical History:  Procedure Laterality Date   NO PAST SURGERIES      Social History   Socioeconomic History   Marital status: Married    Spouse name: Not on file   Number of children: Not on file   Years of education: Not on file   Highest education level: Not on file  Occupational History   Occupation: Paralegal   Tobacco Use   Smoking status: Never    Passive exposure: Past   Smokeless tobacco: Never  Vaping Use   Vaping status: Never Used  Substance and Sexual Activity   Alcohol use: No    Alcohol/week: 0.0 standard drinks of alcohol   Drug use: No   Sexual activity: Yes    Partners: Male  Other Topics Concern   Not on file  Social History Narrative   ParalegalMarried: 23 yrsChildren: 19 yrs and 15 yrsRegular exercise: walking/running 2 x a wkCaffeine use: coffee daily, sweet tea right handed    Social Drivers of Corporate investment banker Strain: Not on file  Food Insecurity: Not on file  Transportation Needs: Not on file  Physical Activity: Not on file  Stress: Not on file  Social Connections: Not on file  Intimate Partner Violence: Not on file  Family History  Problem Relation Age of Onset   Heart disease Mother 11       open heart surgery   Heart disease Maternal Grandmother    Epilepsy Maternal Grandfather    Colon cancer Neg Hx     ROS: no fevers or chills, productive cough, hemoptysis, dysphasia, odynophagia, melena, hematochezia, dysuria, hematuria, rash, seizure activity, orthopnea, PND, pedal edema, claudication. Remaining systems are negative.  Physical Exam:   There were no vitals taken for this visit.  General:  Well developed/well nourished in NAD Skin warm/dry Patient not depressed No peripheral clubbing Back-normal HEENT-normal/normal eyelids Neck supple/normal carotid upstroke bilaterally; no bruits; no JVD; no thyromegaly chest - CTA/ normal expansion CV - RRR/normal S1 and S2; no murmurs, rubs or gallops;  PMI  nondisplaced Abdomen -NT/ND, no HSM, no mass, + bowel sounds, no bruit 2+ femoral pulses, no bruits Ext-no edema, chords, 2+ DP Neuro-grossly nonfocal  ECG - personally reviewed  A/P  1 history of WPW pattern on electrocardiogram-  Redell Shallow, MD

## 2024-04-21 ENCOUNTER — Encounter: Payer: Self-pay | Admitting: Cardiology

## 2024-04-21 ENCOUNTER — Ambulatory Visit: Attending: Cardiology | Admitting: Cardiology

## 2024-04-21 VITALS — BP 120/75 | HR 69 | Ht 68.0 in | Wt 195.0 lb

## 2024-04-21 DIAGNOSIS — I456 Pre-excitation syndrome: Secondary | ICD-10-CM | POA: Diagnosis not present

## 2024-04-21 NOTE — Patient Instructions (Signed)
   Testing/Procedures:  CORONARY CALCIUM SCORING CT SCAN [address]  Follow-Up: At [facility] you and your health needs are our priority.  As part of our continuing mission to provide you with exceptional heart care, our providers are all part of one team.  This team includes your primary Cardiologist (physician) and Advanced Practice Providers or APPs (Physician Assistants and Nurse Practitioners) who all work together to provide you with the care you need, when you need it.  Your next appointment:   12 month(s)  Provider:   REDELL SHALLOW MD

## 2024-04-26 ENCOUNTER — Ambulatory Visit (HOSPITAL_COMMUNITY)

## 2024-05-03 DIAGNOSIS — E063 Autoimmune thyroiditis: Secondary | ICD-10-CM | POA: Diagnosis not present

## 2024-05-03 DIAGNOSIS — N926 Irregular menstruation, unspecified: Secondary | ICD-10-CM | POA: Diagnosis not present

## 2024-05-03 DIAGNOSIS — E559 Vitamin D deficiency, unspecified: Secondary | ICD-10-CM | POA: Diagnosis not present

## 2024-05-03 DIAGNOSIS — E039 Hypothyroidism, unspecified: Secondary | ICD-10-CM | POA: Diagnosis not present

## 2024-05-03 DIAGNOSIS — E538 Deficiency of other specified B group vitamins: Secondary | ICD-10-CM | POA: Diagnosis not present

## 2024-05-03 DIAGNOSIS — E639 Nutritional deficiency, unspecified: Secondary | ICD-10-CM | POA: Diagnosis not present

## 2024-05-03 DIAGNOSIS — R5383 Other fatigue: Secondary | ICD-10-CM | POA: Diagnosis not present

## 2024-05-09 ENCOUNTER — Ambulatory Visit (HOSPITAL_COMMUNITY)
Admission: RE | Admit: 2024-05-09 | Discharge: 2024-05-09 | Disposition: A | Discharge status: Home / Self Care | Payer: Self-pay | Source: Ambulatory Visit | Attending: Cardiology | Admitting: Cardiology

## 2024-05-09 ENCOUNTER — Ambulatory Visit: Payer: Self-pay | Admitting: Cardiology

## 2024-05-09 DIAGNOSIS — I456 Pre-excitation syndrome: Secondary | ICD-10-CM | POA: Insufficient documentation

## 2024-05-10 DIAGNOSIS — E559 Vitamin D deficiency, unspecified: Secondary | ICD-10-CM | POA: Diagnosis not present

## 2024-05-10 DIAGNOSIS — E063 Autoimmune thyroiditis: Secondary | ICD-10-CM | POA: Diagnosis not present

## 2024-05-10 DIAGNOSIS — R5383 Other fatigue: Secondary | ICD-10-CM | POA: Diagnosis not present

## 2024-05-10 DIAGNOSIS — G40909 Epilepsy, unspecified, not intractable, without status epilepticus: Secondary | ICD-10-CM | POA: Diagnosis not present

## 2024-05-15 ENCOUNTER — Other Ambulatory Visit: Payer: Self-pay | Admitting: Medical Genetics

## 2024-05-15 DIAGNOSIS — Z006 Encounter for examination for normal comparison and control in clinical research program: Secondary | ICD-10-CM

## 2025-01-03 ENCOUNTER — Ambulatory Visit: Admitting: Neurology
